# Patient Record
Sex: Female | Born: 1945 | Race: White | Hispanic: No | Smoking: Current every day smoker
Health system: Southern US, Community
[De-identification: ages and names within clinical notes are randomized; demographics above are authoritative.]

## PROBLEM LIST (undated history)

## (undated) DIAGNOSIS — R42 Dizziness and giddiness: Secondary | ICD-10-CM

## (undated) DIAGNOSIS — Z9841 Cataract extraction status, right eye: Secondary | ICD-10-CM

## (undated) DIAGNOSIS — Z972 Presence of dental prosthetic device (complete) (partial): Secondary | ICD-10-CM

## (undated) DIAGNOSIS — D119 Benign neoplasm of major salivary gland, unspecified: Secondary | ICD-10-CM

## (undated) DIAGNOSIS — L409 Psoriasis, unspecified: Secondary | ICD-10-CM

## (undated) DIAGNOSIS — N183 Chronic kidney disease, stage 3 unspecified: Secondary | ICD-10-CM

## (undated) DIAGNOSIS — J449 Chronic obstructive pulmonary disease, unspecified: Secondary | ICD-10-CM

## (undated) DIAGNOSIS — C4492 Squamous cell carcinoma of skin, unspecified: Secondary | ICD-10-CM

## (undated) DIAGNOSIS — Z7982 Long term (current) use of aspirin: Secondary | ICD-10-CM

## (undated) DIAGNOSIS — N189 Chronic kidney disease, unspecified: Secondary | ICD-10-CM

## (undated) DIAGNOSIS — I700 Atherosclerosis of aorta: Secondary | ICD-10-CM

## (undated) DIAGNOSIS — E785 Hyperlipidemia, unspecified: Secondary | ICD-10-CM

## (undated) DIAGNOSIS — C4491 Basal cell carcinoma of skin, unspecified: Secondary | ICD-10-CM

## (undated) DIAGNOSIS — I2510 Atherosclerotic heart disease of native coronary artery without angina pectoris: Secondary | ICD-10-CM

## (undated) DIAGNOSIS — I6529 Occlusion and stenosis of unspecified carotid artery: Secondary | ICD-10-CM

## (undated) DIAGNOSIS — I671 Cerebral aneurysm, nonruptured: Secondary | ICD-10-CM

## (undated) DIAGNOSIS — G459 Transient cerebral ischemic attack, unspecified: Secondary | ICD-10-CM

## (undated) DIAGNOSIS — R911 Solitary pulmonary nodule: Secondary | ICD-10-CM

## (undated) DIAGNOSIS — I10 Essential (primary) hypertension: Secondary | ICD-10-CM

## (undated) DIAGNOSIS — M51379 Other intervertebral disc degeneration, lumbosacral region without mention of lumbar back pain or lower extremity pain: Secondary | ICD-10-CM

## (undated) DIAGNOSIS — I219 Acute myocardial infarction, unspecified: Secondary | ICD-10-CM

## (undated) DIAGNOSIS — I714 Abdominal aortic aneurysm, without rupture, unspecified: Secondary | ICD-10-CM

## (undated) DIAGNOSIS — K08109 Complete loss of teeth, unspecified cause, unspecified class: Secondary | ICD-10-CM

## (undated) DIAGNOSIS — M1990 Unspecified osteoarthritis, unspecified site: Secondary | ICD-10-CM

## (undated) DIAGNOSIS — I739 Peripheral vascular disease, unspecified: Secondary | ICD-10-CM

## (undated) DIAGNOSIS — G4730 Sleep apnea, unspecified: Secondary | ICD-10-CM

## (undated) HISTORY — PX: CATARACT EXTRACTION W/ INTRAOCULAR LENS IMPLANT: SHX1309

## (undated) HISTORY — PX: EYE SURGERY: SHX253

---

## 1998-04-06 HISTORY — PX: UVULOPALATOPHARYNGOPLASTY: SHX827

## 2009-04-06 HISTORY — PX: CARDIAC CATHETERIZATION: SHX172

## 2009-07-05 DIAGNOSIS — I214 Non-ST elevation (NSTEMI) myocardial infarction: Secondary | ICD-10-CM

## 2009-07-05 DIAGNOSIS — I219 Acute myocardial infarction, unspecified: Secondary | ICD-10-CM

## 2009-07-05 HISTORY — DX: Acute myocardial infarction, unspecified: I21.9

## 2009-07-05 HISTORY — DX: Non-ST elevation (NSTEMI) myocardial infarction: I21.4

## 2009-07-11 ENCOUNTER — Inpatient Hospital Stay: Payer: Self-pay | Admitting: Internal Medicine

## 2009-07-12 HISTORY — PX: LEFT HEART CATH AND CORONARY ANGIOGRAPHY: CATH118249

## 2009-07-24 DIAGNOSIS — I10 Essential (primary) hypertension: Secondary | ICD-10-CM | POA: Insufficient documentation

## 2009-11-12 ENCOUNTER — Ambulatory Visit: Payer: Self-pay | Admitting: Ophthalmology

## 2009-12-17 ENCOUNTER — Ambulatory Visit: Payer: Self-pay | Admitting: Ophthalmology

## 2010-06-05 ENCOUNTER — Inpatient Hospital Stay: Payer: Self-pay | Admitting: Specialist

## 2012-04-06 DIAGNOSIS — G459 Transient cerebral ischemic attack, unspecified: Secondary | ICD-10-CM

## 2012-04-06 HISTORY — DX: Transient cerebral ischemic attack, unspecified: G45.9

## 2013-07-31 DIAGNOSIS — Z8673 Personal history of transient ischemic attack (TIA), and cerebral infarction without residual deficits: Secondary | ICD-10-CM

## 2014-06-18 DIAGNOSIS — C4492 Squamous cell carcinoma of skin, unspecified: Secondary | ICD-10-CM

## 2014-06-18 HISTORY — DX: Squamous cell carcinoma of skin, unspecified: C44.92

## 2014-10-29 DIAGNOSIS — L570 Actinic keratosis: Secondary | ICD-10-CM

## 2014-10-29 HISTORY — DX: Actinic keratosis: L57.0

## 2015-05-06 DIAGNOSIS — Z79899 Other long term (current) drug therapy: Secondary | ICD-10-CM | POA: Diagnosis not present

## 2015-05-06 DIAGNOSIS — Z85828 Personal history of other malignant neoplasm of skin: Secondary | ICD-10-CM | POA: Diagnosis not present

## 2015-05-06 DIAGNOSIS — L400 Psoriasis vulgaris: Secondary | ICD-10-CM | POA: Diagnosis not present

## 2015-05-06 DIAGNOSIS — L820 Inflamed seborrheic keratosis: Secondary | ICD-10-CM | POA: Diagnosis not present

## 2015-05-06 DIAGNOSIS — L720 Epidermal cyst: Secondary | ICD-10-CM | POA: Diagnosis not present

## 2015-05-06 DIAGNOSIS — L700 Acne vulgaris: Secondary | ICD-10-CM | POA: Diagnosis not present

## 2015-05-06 DIAGNOSIS — L570 Actinic keratosis: Secondary | ICD-10-CM | POA: Diagnosis not present

## 2015-06-05 DIAGNOSIS — E785 Hyperlipidemia, unspecified: Secondary | ICD-10-CM | POA: Diagnosis not present

## 2015-06-05 DIAGNOSIS — F17200 Nicotine dependence, unspecified, uncomplicated: Secondary | ICD-10-CM | POA: Diagnosis not present

## 2015-06-05 DIAGNOSIS — Z8673 Personal history of transient ischemic attack (TIA), and cerebral infarction without residual deficits: Secondary | ICD-10-CM | POA: Diagnosis not present

## 2015-06-05 DIAGNOSIS — I10 Essential (primary) hypertension: Secondary | ICD-10-CM | POA: Diagnosis not present

## 2015-06-05 DIAGNOSIS — I2510 Atherosclerotic heart disease of native coronary artery without angina pectoris: Secondary | ICD-10-CM | POA: Diagnosis not present

## 2015-06-06 DIAGNOSIS — E785 Hyperlipidemia, unspecified: Secondary | ICD-10-CM | POA: Diagnosis not present

## 2015-06-06 DIAGNOSIS — Z8673 Personal history of transient ischemic attack (TIA), and cerebral infarction without residual deficits: Secondary | ICD-10-CM | POA: Diagnosis not present

## 2015-06-06 DIAGNOSIS — I2510 Atherosclerotic heart disease of native coronary artery without angina pectoris: Secondary | ICD-10-CM | POA: Diagnosis not present

## 2015-06-06 DIAGNOSIS — I10 Essential (primary) hypertension: Secondary | ICD-10-CM | POA: Diagnosis not present

## 2015-06-14 DIAGNOSIS — L400 Psoriasis vulgaris: Secondary | ICD-10-CM | POA: Diagnosis not present

## 2015-08-12 DIAGNOSIS — L400 Psoriasis vulgaris: Secondary | ICD-10-CM | POA: Diagnosis not present

## 2015-08-12 DIAGNOSIS — L820 Inflamed seborrheic keratosis: Secondary | ICD-10-CM | POA: Diagnosis not present

## 2015-08-12 DIAGNOSIS — L720 Epidermal cyst: Secondary | ICD-10-CM | POA: Diagnosis not present

## 2015-09-09 DIAGNOSIS — J301 Allergic rhinitis due to pollen: Secondary | ICD-10-CM | POA: Diagnosis not present

## 2015-09-09 DIAGNOSIS — H6123 Impacted cerumen, bilateral: Secondary | ICD-10-CM | POA: Diagnosis not present

## 2015-10-16 DIAGNOSIS — E7800 Pure hypercholesterolemia, unspecified: Secondary | ICD-10-CM | POA: Diagnosis not present

## 2015-10-16 DIAGNOSIS — Z0000 Encounter for general adult medical examination without abnormal findings: Secondary | ICD-10-CM | POA: Diagnosis not present

## 2015-10-16 DIAGNOSIS — R7303 Prediabetes: Secondary | ICD-10-CM | POA: Diagnosis not present

## 2015-10-16 DIAGNOSIS — I10 Essential (primary) hypertension: Secondary | ICD-10-CM | POA: Diagnosis not present

## 2015-10-16 DIAGNOSIS — I6523 Occlusion and stenosis of bilateral carotid arteries: Secondary | ICD-10-CM | POA: Diagnosis not present

## 2015-10-16 DIAGNOSIS — F17200 Nicotine dependence, unspecified, uncomplicated: Secondary | ICD-10-CM | POA: Diagnosis not present

## 2015-10-16 DIAGNOSIS — Z23 Encounter for immunization: Secondary | ICD-10-CM | POA: Diagnosis not present

## 2015-10-21 DIAGNOSIS — I6523 Occlusion and stenosis of bilateral carotid arteries: Secondary | ICD-10-CM | POA: Diagnosis not present

## 2015-10-21 DIAGNOSIS — F17200 Nicotine dependence, unspecified, uncomplicated: Secondary | ICD-10-CM | POA: Diagnosis not present

## 2015-10-21 DIAGNOSIS — I2510 Atherosclerotic heart disease of native coronary artery without angina pectoris: Secondary | ICD-10-CM | POA: Diagnosis not present

## 2015-10-21 DIAGNOSIS — E7800 Pure hypercholesterolemia, unspecified: Secondary | ICD-10-CM | POA: Diagnosis not present

## 2015-10-21 DIAGNOSIS — I10 Essential (primary) hypertension: Secondary | ICD-10-CM | POA: Diagnosis not present

## 2015-10-21 DIAGNOSIS — N183 Chronic kidney disease, stage 3 (moderate): Secondary | ICD-10-CM | POA: Diagnosis not present

## 2015-10-22 DIAGNOSIS — Z1231 Encounter for screening mammogram for malignant neoplasm of breast: Secondary | ICD-10-CM | POA: Diagnosis not present

## 2015-11-01 DIAGNOSIS — I6523 Occlusion and stenosis of bilateral carotid arteries: Secondary | ICD-10-CM | POA: Diagnosis not present

## 2015-11-06 ENCOUNTER — Other Ambulatory Visit (HOSPITAL_COMMUNITY): Payer: Self-pay | Admitting: Vascular Surgery

## 2015-11-06 ENCOUNTER — Other Ambulatory Visit: Payer: Self-pay | Admitting: Vascular Surgery

## 2015-11-06 DIAGNOSIS — I6523 Occlusion and stenosis of bilateral carotid arteries: Secondary | ICD-10-CM

## 2015-11-06 DIAGNOSIS — I10 Essential (primary) hypertension: Secondary | ICD-10-CM | POA: Diagnosis not present

## 2015-11-06 DIAGNOSIS — E785 Hyperlipidemia, unspecified: Secondary | ICD-10-CM | POA: Diagnosis not present

## 2015-11-12 ENCOUNTER — Ambulatory Visit
Admission: RE | Admit: 2015-11-12 | Discharge: 2015-11-12 | Disposition: A | Discharge status: Home / Self Care | Payer: PPO | Source: Ambulatory Visit | Attending: Vascular Surgery | Admitting: Vascular Surgery

## 2015-11-12 DIAGNOSIS — I6523 Occlusion and stenosis of bilateral carotid arteries: Secondary | ICD-10-CM | POA: Diagnosis not present

## 2015-11-12 DIAGNOSIS — I6503 Occlusion and stenosis of bilateral vertebral arteries: Secondary | ICD-10-CM | POA: Insufficient documentation

## 2015-11-12 DIAGNOSIS — J432 Centrilobular emphysema: Secondary | ICD-10-CM | POA: Insufficient documentation

## 2015-11-12 HISTORY — DX: Essential (primary) hypertension: I10

## 2015-11-12 HISTORY — DX: Basal cell carcinoma of skin, unspecified: C44.91

## 2015-11-12 HISTORY — DX: Squamous cell carcinoma of skin, unspecified: C44.92

## 2015-11-12 LAB — POCT I-STAT CREATININE: Creatinine, Ser: 1.2 mg/dL — ABNORMAL HIGH (ref 0.44–1.00)

## 2015-11-12 MED ORDER — IOPAMIDOL (ISOVUE-370) INJECTION 76%
75.0000 mL | Freq: Once | INTRAVENOUS | Status: AC | PRN
Start: 2015-11-12 — End: 2015-11-12
  Administered 2015-11-12: 75 mL via INTRAVENOUS

## 2015-11-15 DIAGNOSIS — E785 Hyperlipidemia, unspecified: Secondary | ICD-10-CM | POA: Diagnosis not present

## 2015-11-15 DIAGNOSIS — I6523 Occlusion and stenosis of bilateral carotid arteries: Secondary | ICD-10-CM | POA: Diagnosis not present

## 2015-11-15 DIAGNOSIS — I10 Essential (primary) hypertension: Secondary | ICD-10-CM | POA: Diagnosis not present

## 2015-11-21 ENCOUNTER — Other Ambulatory Visit: Payer: Self-pay | Admitting: Vascular Surgery

## 2015-11-22 ENCOUNTER — Encounter
Admission: RE | Admit: 2015-11-22 | Discharge: 2015-11-22 | Disposition: A | Discharge status: Home / Self Care | Payer: PPO | Source: Ambulatory Visit | Attending: Vascular Surgery | Admitting: Vascular Surgery

## 2015-11-22 ENCOUNTER — Encounter: Payer: Self-pay | Admitting: *Deleted

## 2015-11-22 DIAGNOSIS — Z01812 Encounter for preprocedural laboratory examination: Secondary | ICD-10-CM | POA: Diagnosis not present

## 2015-11-22 HISTORY — DX: Peripheral vascular disease, unspecified: I73.9

## 2015-11-22 HISTORY — DX: Unspecified osteoarthritis, unspecified site: M19.90

## 2015-11-22 HISTORY — DX: Sleep apnea, unspecified: G47.30

## 2015-11-22 HISTORY — DX: Atherosclerotic heart disease of native coronary artery without angina pectoris: I25.10

## 2015-11-22 HISTORY — DX: Transient cerebral ischemic attack, unspecified: G45.9

## 2015-11-22 HISTORY — DX: Chronic kidney disease, unspecified: N18.9

## 2015-11-22 HISTORY — DX: Psoriasis, unspecified: L40.9

## 2015-11-22 HISTORY — DX: Hyperlipidemia, unspecified: E78.5

## 2015-11-22 HISTORY — DX: Acute myocardial infarction, unspecified: I21.9

## 2015-11-22 LAB — BASIC METABOLIC PANEL
ANION GAP: 9 (ref 5–15)
BUN: 22 mg/dL — ABNORMAL HIGH (ref 6–20)
CALCIUM: 9.9 mg/dL (ref 8.9–10.3)
CO2: 26 mmol/L (ref 22–32)
CREATININE: 1.49 mg/dL — AB (ref 0.44–1.00)
Chloride: 102 mmol/L (ref 101–111)
GFR calc Af Amer: 40 mL/min — ABNORMAL LOW (ref >60)
GFR calc non Af Amer: 34 mL/min — ABNORMAL LOW (ref >60)
GLUCOSE: 96 mg/dL (ref 65–99)
Potassium: 3.6 mmol/L (ref 3.5–5.1)
Sodium: 137 mmol/L (ref 135–145)

## 2015-11-22 LAB — CBC WITH DIFFERENTIAL/PLATELET
BASOS PCT: 0 %
Basophils Absolute: 0 10*3/uL (ref 0–0.1)
Eosinophils Absolute: 0.1 10*3/uL (ref 0–0.7)
Eosinophils Relative: 1 %
HEMATOCRIT: 42.2 % (ref 35.0–47.0)
Hemoglobin: 14.1 g/dL (ref 12.0–16.0)
Lymphocytes Relative: 20 %
Lymphs Abs: 2.1 10*3/uL (ref 1.0–3.6)
MCH: 29.7 pg (ref 26.0–34.0)
MCHC: 33.5 g/dL (ref 32.0–36.0)
MCV: 88.6 fL (ref 80.0–100.0)
MONO ABS: 0.8 10*3/uL (ref 0.2–0.9)
MONOS PCT: 8 %
NEUTROS ABS: 7.4 10*3/uL — AB (ref 1.4–6.5)
Neutrophils Relative %: 71 %
Platelets: 275 10*3/uL (ref 150–440)
RBC: 4.77 MIL/uL (ref 3.80–5.20)
RDW: 14.9 % — AB (ref 11.5–14.5)
WBC: 10.6 10*3/uL (ref 3.6–11.0)

## 2015-11-22 LAB — TYPE AND SCREEN
ABO/RH(D): A POS
Antibody Screen: NEGATIVE

## 2015-11-22 LAB — PROTIME-INR
INR: 0.87
Prothrombin Time: 11.8 seconds (ref 11.4–15.2)

## 2015-11-22 LAB — SURGICAL PCR SCREEN
MRSA, PCR: NEGATIVE
STAPHYLOCOCCUS AUREUS: NEGATIVE

## 2015-11-22 LAB — APTT: aPTT: 26 seconds (ref 24–36)

## 2015-11-22 NOTE — Patient Instructions (Signed)
  Your procedure is scheduled [id number] 24, 2017 (Thursday) Report to Same Day Surgery 2nd floor Medical Mall To find out your arrival time please call [phone] between 1PM - 3PM on November 27, 2015 (Wednesday)  Remember: Instructions that are not followed completely may result in serious medical risk, up to and including death, or upon the discretion of your surgeon and anesthesiologist your surgery may need to be rescheduled.    _x___ 1. Do not eat food or drink liquids after midnight. No gum chewing or hard candies.     _x___ 2. No Alcohol for 24 hours before or after surgery.   _x__3. No Smoking for 24 prior to surgery.   ____  4. Bring all medications with you on the day of surgery if instructed.    __x__ 5. Notify your doctor if there is any change in your medical condition     (cold, fever, infections).     Do not wear jewelry, make-up, hairpins, clips or nail polish.  Do not wear lotions, powders, or perfumes. You may wear deodorant.  Do not shave 48 hours prior to surgery. Men may shave face and neck.  Do not bring valuables to the hospital.    [facility] is not responsible for any belongings or valuables.               Contacts, dentures or bridgework may not be worn into surgery.  Leave your suitcase in the car. After surgery it may be brought to your room.  For patients admitted to the hospital, discharge time is determined by your treatment team.   Patients discharged the day of surgery will not be allowed to drive home.    Please read over the following fact sheets that you were given:   [facility] Preparing for Surgery and or MRSA Information   _x___ Take these medicines the morning of surgery with A SIP OF WATER:    1. Amlodipine  2. HydrAlazine  3. Isosorbide  4.  5.  6.  ____ Fleet Enema (as directed)   _x___ Use CHG Soap or sage wipes as directed on instruction sheet   ____ Use inhalers on the day of surgery and bring to hospital day of  surgery  ____ Stop metformin 2 days prior to surgery    ____ Take 1/2 of usual insulin dose the night before surgery and none on the morning of surgery             _x___ Stop aspirin or coumadin, or plavix  (STOP PLAVIX NOW) May continue Aspirin  _x__ Stop Anti-inflammatories such as Advil, Aleve, Ibuprofen, Motrin, Naproxen,          Naprosyn, Goodies powders or aspirin products. Ok to take Tylenol.   ____ Stop supplements until after surgery.    ____ Bring C-Pap to the hospital.

## 2015-11-22 NOTE — Pre-Procedure Instructions (Signed)
  Component Name Value Ref Range  Vent Rate (bpm) 64   PR Interval (msec) 176   QRS Interval (msec) 98   QT Interval (msec) 426   QTc (msec) 439   Result Narrative  Normal sinus rhythm Nonspecific T wave abnormalities Abnormal ECG When compared with ECG of 07-Jul-2013 20:43, Nonspecific T wave abnormalities no longer evident in Inferior leads T wave inversion now evident in Anterior leads I reviewed and concur with this report. Electronically signed [id number] MD, KEN [phone]) on 10/28/2015 8:46:14 AM  Status Results Details    Initial consult on 10/21/2015 [place]")' href="[email or web address]

## 2015-11-22 NOTE — Pre-Procedure Instructions (Signed)
Cardiac clearance received from Dr. Ubaldo [place].

## 2015-11-28 ENCOUNTER — Encounter: Payer: Self-pay | Admitting: *Deleted

## 2015-11-28 ENCOUNTER — Inpatient Hospital Stay: Payer: PPO | Admitting: Anesthesiology

## 2015-11-28 ENCOUNTER — Encounter
Admission: RE | Disposition: A | Discharge status: Home / Self Care | Payer: Self-pay | Source: Ambulatory Visit | Attending: Vascular Surgery

## 2015-11-28 ENCOUNTER — Inpatient Hospital Stay
Admission: RE | Admit: 2015-11-28 | Discharge: 2015-11-29 | DRG: 039 | Disposition: A | Discharge status: Home / Self Care | Payer: PPO | Source: Ambulatory Visit | Attending: Vascular Surgery | Admitting: Vascular Surgery

## 2015-11-28 DIAGNOSIS — R590 Localized enlarged lymph nodes: Secondary | ICD-10-CM | POA: Diagnosis present

## 2015-11-28 DIAGNOSIS — I6521 Occlusion and stenosis of right carotid artery: Secondary | ICD-10-CM | POA: Diagnosis not present

## 2015-11-28 DIAGNOSIS — Z79899 Other long term (current) drug therapy: Secondary | ICD-10-CM | POA: Diagnosis not present

## 2015-11-28 DIAGNOSIS — I252 Old myocardial infarction: Secondary | ICD-10-CM

## 2015-11-28 DIAGNOSIS — G4730 Sleep apnea, unspecified: Secondary | ICD-10-CM | POA: Diagnosis present

## 2015-11-28 DIAGNOSIS — I129 Hypertensive chronic kidney disease with stage 1 through stage 4 chronic kidney disease, or unspecified chronic kidney disease: Secondary | ICD-10-CM | POA: Diagnosis not present

## 2015-11-28 DIAGNOSIS — I739 Peripheral vascular disease, unspecified: Secondary | ICD-10-CM | POA: Diagnosis present

## 2015-11-28 DIAGNOSIS — Z85828 Personal history of other malignant neoplasm of skin: Secondary | ICD-10-CM

## 2015-11-28 DIAGNOSIS — I6529 Occlusion and stenosis of unspecified carotid artery: Secondary | ICD-10-CM | POA: Diagnosis present

## 2015-11-28 DIAGNOSIS — Z7982 Long term (current) use of aspirin: Secondary | ICD-10-CM | POA: Diagnosis not present

## 2015-11-28 DIAGNOSIS — I6523 Occlusion and stenosis of bilateral carotid arteries: Secondary | ICD-10-CM | POA: Diagnosis not present

## 2015-11-28 DIAGNOSIS — R221 Localized swelling, mass and lump, neck: Secondary | ICD-10-CM | POA: Diagnosis not present

## 2015-11-28 DIAGNOSIS — I6522 Occlusion and stenosis of left carotid artery: Secondary | ICD-10-CM | POA: Diagnosis not present

## 2015-11-28 DIAGNOSIS — I2510 Atherosclerotic heart disease of native coronary artery without angina pectoris: Secondary | ICD-10-CM | POA: Diagnosis not present

## 2015-11-28 DIAGNOSIS — E785 Hyperlipidemia, unspecified: Secondary | ICD-10-CM | POA: Diagnosis not present

## 2015-11-28 DIAGNOSIS — F17200 Nicotine dependence, unspecified, uncomplicated: Secondary | ICD-10-CM | POA: Diagnosis not present

## 2015-11-28 DIAGNOSIS — N183 Chronic kidney disease, stage 3 (moderate): Secondary | ICD-10-CM | POA: Diagnosis not present

## 2015-11-28 DIAGNOSIS — Z8673 Personal history of transient ischemic attack (TIA), and cerebral infarction without residual deficits: Secondary | ICD-10-CM

## 2015-11-28 DIAGNOSIS — I10 Essential (primary) hypertension: Secondary | ICD-10-CM | POA: Diagnosis not present

## 2015-11-28 HISTORY — PX: ENDARTERECTOMY: SHX5162

## 2015-11-28 SURGERY — ENDARTERECTOMY, CAROTID
Anesthesia: General | Laterality: Left | Wound class: Clean

## 2015-11-28 MED ORDER — HYDRALAZINE HCL 25 MG PO TABS
25.0000 mg | ORAL_TABLET | Freq: Two times a day (BID) | ORAL | Status: DC
  Administered 2015-11-29: 25 mg via ORAL
  Filled 2015-11-28: qty 1

## 2015-11-28 MED ORDER — CEFAZOLIN SODIUM-DEXTROSE 2-4 GM/100ML-% IV SOLN
2.0000 g | INTRAVENOUS | Status: AC
  Administered 2015-11-28: 2 g via INTRAVENOUS

## 2015-11-28 MED ORDER — LIDOCAINE HCL (PF) 1 % IJ SOLN
INTRAMUSCULAR | Status: AC
  Filled 2015-11-28: qty 30

## 2015-11-28 MED ORDER — SUCCINYLCHOLINE CHLORIDE 20 MG/ML IJ SOLN
INTRAMUSCULAR | Status: DC | PRN
  Administered 2015-11-28: 100 mg via INTRAVENOUS

## 2015-11-28 MED ORDER — SODIUM CHLORIDE 0.9 % IV SOLN: 500.0000 mL | Freq: Once | INTRAVENOUS | Status: DC | PRN

## 2015-11-28 MED ORDER — DEXAMETHASONE SODIUM PHOSPHATE 10 MG/ML IJ SOLN
INTRAMUSCULAR | Status: DC | PRN
  Administered 2015-11-28: 10 mg via INTRAVENOUS

## 2015-11-28 MED ORDER — FLUTICASONE PROPIONATE 50 MCG/ACT NA SUSP
2.0000 | Freq: Every day | NASAL | Status: DC
Start: 2015-11-29 — End: 2015-11-29
  Filled 2015-11-28: qty 16

## 2015-11-28 MED ORDER — PHENOL 1.4 % MT LIQD
1.0000 | OROMUCOSAL | Status: DC | PRN
  Filled 2015-11-28: qty 177

## 2015-11-28 MED ORDER — GUAIFENESIN-DM 100-10 MG/5ML PO SYRP: 15.0000 mL | ORAL_SOLUTION | ORAL | Status: DC | PRN

## 2015-11-28 MED ORDER — SODIUM CHLORIDE FLUSH 0.9 % IV SOLN
INTRAVENOUS | Status: AC
  Filled 2015-11-28: qty 3

## 2015-11-28 MED ORDER — ESMOLOL HCL-SODIUM CHLORIDE 2000 MG/100ML IV SOLN: 25.0000 ug/kg/min | INTRAVENOUS | Status: DC

## 2015-11-28 MED ORDER — FAMOTIDINE 20 MG PO TABS
20.0000 mg | ORAL_TABLET | Freq: Once | ORAL | Status: AC
  Administered 2015-11-28: 20 mg via ORAL

## 2015-11-28 MED ORDER — ALBUTEROL SULFATE HFA 108 (90 BASE) MCG/ACT IN AERS
INHALATION_SPRAY | RESPIRATORY_TRACT | Status: DC | PRN
  Administered 2015-11-28: 6 via RESPIRATORY_TRACT

## 2015-11-28 MED ORDER — HEPARIN SODIUM (PORCINE) 1000 UNIT/ML IJ SOLN
INTRAMUSCULAR | Status: AC
  Filled 2015-11-28: qty 1

## 2015-11-28 MED ORDER — CEFAZOLIN SODIUM-DEXTROSE 2-4 GM/100ML-% IV SOLN
INTRAVENOUS | Status: AC
  Filled 2015-11-28: qty 100

## 2015-11-28 MED ORDER — ALUM & MAG HYDROXIDE-SIMETH 200-200-20 MG/5ML PO SUSP: 15.0000 mL | ORAL | Status: DC | PRN

## 2015-11-28 MED ORDER — DOCUSATE SODIUM 100 MG PO CAPS
100.0000 mg | ORAL_CAPSULE | Freq: Every day | ORAL | Status: DC
  Administered 2015-11-29: 100 mg via ORAL
  Filled 2015-11-28: qty 1

## 2015-11-28 MED ORDER — LIDOCAINE HCL 1 % IJ SOLN
INTRAMUSCULAR | Status: DC | PRN
  Administered 2015-11-28: 10 mL

## 2015-11-28 MED ORDER — EVICEL 2 ML EX KIT
PACK | CUTANEOUS | Status: DC | PRN
  Administered 2015-11-28: 2 mL

## 2015-11-28 MED ORDER — SODIUM CHLORIDE 0.9 % IV SOLN
INTRAVENOUS | Status: DC
  Administered 2015-11-28: 1000 mL via INTRAVENOUS

## 2015-11-28 MED ORDER — SODIUM CHLORIDE 0.9 % IV SOLN
INTRAVENOUS | Status: DC | PRN
  Administered 2015-11-28: .05 ug/kg/min via INTRAVENOUS

## 2015-11-28 MED ORDER — AMLODIPINE BESYLATE 10 MG PO TABS
10.0000 mg | ORAL_TABLET | Freq: Every day | ORAL | Status: DC
  Administered 2015-11-29: 10 mg via ORAL
  Filled 2015-11-28: qty 1

## 2015-11-28 MED ORDER — PROPOFOL 10 MG/ML IV BOLUS
INTRAVENOUS | Status: DC | PRN
  Administered 2015-11-28: 100 mg via INTRAVENOUS

## 2015-11-28 MED ORDER — ATORVASTATIN CALCIUM 20 MG PO TABS
40.0000 mg | ORAL_TABLET | Freq: Every day | ORAL | Status: DC
  Administered 2015-11-28: 40 mg via ORAL
  Filled 2015-11-28: qty 2

## 2015-11-28 MED ORDER — HYDRALAZINE HCL 20 MG/ML IJ SOLN: 5.0000 mg | INTRAMUSCULAR | Status: DC | PRN

## 2015-11-28 MED ORDER — ACETAMINOPHEN 650 MG RE SUPP
325.0000 mg | RECTAL | Status: DC | PRN
Start: 2015-11-28 — End: 2015-11-29

## 2015-11-28 MED ORDER — CLOPIDOGREL BISULFATE 75 MG PO TABS
75.0000 mg | ORAL_TABLET | Freq: Every day | ORAL | Status: DC
  Administered 2015-11-29: 75 mg via ORAL
  Filled 2015-11-28: qty 1

## 2015-11-28 MED ORDER — EVICEL 2 ML EX KIT
PACK | CUTANEOUS | Status: AC
  Filled 2015-11-28: qty 1

## 2015-11-28 MED ORDER — DEXTROSE 5 % IV SOLN
1.5000 g | Freq: Two times a day (BID) | INTRAVENOUS | Status: AC
  Administered 2015-11-28 – 2015-11-29 (×2): 1.5 g via INTRAVENOUS
  Filled 2015-11-28 (×2): qty 1.5

## 2015-11-28 MED ORDER — ROCURONIUM BROMIDE 100 MG/10ML IV SOLN
INTRAVENOUS | Status: DC | PRN
  Administered 2015-11-28: 30 mg via INTRAVENOUS
  Administered 2015-11-28: 10 mg via INTRAVENOUS

## 2015-11-28 MED ORDER — CEFAZOLIN SODIUM-DEXTROSE 2-3 GM-% IV SOLR
INTRAVENOUS | Status: DC | PRN
  Administered 2015-11-28: 2 g via INTRAVENOUS

## 2015-11-28 MED ORDER — ONDANSETRON HCL 4 MG/2ML IJ SOLN: 4.0000 mg | Freq: Once | INTRAMUSCULAR | Status: DC | PRN

## 2015-11-28 MED ORDER — ASPIRIN EC 81 MG PO TBEC
81.0000 mg | DELAYED_RELEASE_TABLET | Freq: Every day | ORAL | Status: DC
  Administered 2015-11-28 – 2015-11-29 (×2): 81 mg via ORAL
  Filled 2015-11-28 (×3): qty 1

## 2015-11-28 MED ORDER — FENTANYL CITRATE (PF) 100 MCG/2ML IJ SOLN
INTRAMUSCULAR | Status: AC
  Filled 2015-11-28: qty 2

## 2015-11-28 MED ORDER — SODIUM CHLORIDE 0.9 % IV SOLN
INTRAVENOUS | Status: DC | PRN
  Administered 2015-11-28: 40 mL via INTRAMUSCULAR

## 2015-11-28 MED ORDER — ACETAMINOPHEN 325 MG PO TABS: 325.0000 mg | ORAL_TABLET | ORAL | Status: DC | PRN

## 2015-11-28 MED ORDER — CEFAZOLIN SODIUM 1 G IJ SOLR
INTRAMUSCULAR | Status: DC | PRN
  Administered 2015-11-28: 1000 mL

## 2015-11-28 MED ORDER — CHLORHEXIDINE GLUCONATE CLOTH 2 % EX PADS: 6.0000 | MEDICATED_PAD | Freq: Once | CUTANEOUS | Status: DC

## 2015-11-28 MED ORDER — LIDOCAINE 2% (20 MG/ML) 5 ML SYRINGE
INTRAMUSCULAR | Status: DC | PRN
  Administered 2015-11-28: 100 mg via INTRAVENOUS

## 2015-11-28 MED ORDER — FAMOTIDINE 20 MG PO TABS
ORAL_TABLET | ORAL | Status: AC
  Administered 2015-11-28: 20 mg
  Filled 2015-11-28: qty 1

## 2015-11-28 MED ORDER — PHENYLEPHRINE HCL 10 MG/ML IJ SOLN
INTRAMUSCULAR | Status: DC | PRN
  Administered 2015-11-28 (×3): 100 ug via INTRAVENOUS

## 2015-11-28 MED ORDER — LACTATED RINGERS IV SOLN
INTRAVENOUS | Status: DC
  Administered 2015-11-28 (×3): via INTRAVENOUS

## 2015-11-28 MED ORDER — LABETALOL HCL 5 MG/ML IV SOLN: 10.0000 mg | INTRAVENOUS | Status: DC | PRN

## 2015-11-28 MED ORDER — ISOSORBIDE MONONITRATE ER 30 MG PO TB24
30.0000 mg | ORAL_TABLET | Freq: Every day | ORAL | Status: DC
  Administered 2015-11-29: 30 mg via ORAL
  Filled 2015-11-28: qty 1

## 2015-11-28 MED ORDER — NITROGLYCERIN IN D5W 200-5 MCG/ML-% IV SOLN
INTRAVENOUS | Status: AC
  Filled 2015-11-28: qty 250

## 2015-11-28 MED ORDER — CEFAZOLIN SODIUM 1 G IJ SOLR
INTRAMUSCULAR | Status: AC
  Filled 2015-11-28: qty 10

## 2015-11-28 MED ORDER — PHENYLEPHRINE HCL 10 MG/ML IJ SOLN
INTRAVENOUS | Status: DC | PRN
  Administered 2015-11-28: 50 ug/min via INTRAVENOUS

## 2015-11-28 MED ORDER — NITROGLYCERIN IN D5W 200-5 MCG/ML-% IV SOLN: 5.0000 ug/min | INTRAVENOUS | Status: DC

## 2015-11-28 MED ORDER — DEXMEDETOMIDINE HCL 200 MCG/2ML IV SOLN
INTRAVENOUS | Status: DC | PRN
  Administered 2015-11-28: 12 ug via INTRAVENOUS

## 2015-11-28 MED ORDER — MAGNESIUM SULFATE 2 GM/50ML IV SOLN
2.0000 g | Freq: Every day | INTRAVENOUS | Status: DC | PRN
  Filled 2015-11-28: qty 50

## 2015-11-28 MED ORDER — DOPAMINE-DEXTROSE 3.2-5 MG/ML-% IV SOLN: 3.0000 ug/kg/min | INTRAVENOUS | Status: DC

## 2015-11-28 MED ORDER — FENTANYL CITRATE (PF) 100 MCG/2ML IJ SOLN
INTRAMUSCULAR | Status: DC | PRN
  Administered 2015-11-28 (×2): 100 ug via INTRAVENOUS

## 2015-11-28 MED ORDER — ONDANSETRON HCL 4 MG/2ML IJ SOLN: 4.0000 mg | Freq: Four times a day (QID) | INTRAMUSCULAR | Status: DC | PRN

## 2015-11-28 MED ORDER — HEPARIN SODIUM (PORCINE) 1000 UNIT/ML IJ SOLN
INTRAMUSCULAR | Status: DC | PRN
  Administered 2015-11-28: 5000 [IU] via INTRAVENOUS

## 2015-11-28 MED ORDER — ONDANSETRON HCL 4 MG/2ML IJ SOLN
INTRAMUSCULAR | Status: DC | PRN
Start: 2015-11-28 — End: 2015-11-28
  Administered 2015-11-28: 4 mg via INTRAVENOUS

## 2015-11-28 MED ORDER — FENTANYL CITRATE (PF) 100 MCG/2ML IJ SOLN
25.0000 ug | INTRAMUSCULAR | Status: DC | PRN
  Administered 2015-11-28 (×2): 25 ug via INTRAVENOUS

## 2015-11-28 MED ORDER — POTASSIUM CHLORIDE CRYS ER 20 MEQ PO TBCR: 20.0000 meq | EXTENDED_RELEASE_TABLET | Freq: Every day | ORAL | Status: DC | PRN

## 2015-11-28 MED ORDER — NITROGLYCERIN 0.2 MG/ML ON CALL CATH LAB
INTRAVENOUS | Status: DC | PRN
  Administered 2015-11-28 (×5): 40 ug via INTRAVENOUS

## 2015-11-28 MED ORDER — MORPHINE SULFATE (PF) 2 MG/ML IV SOLN
2.0000 mg | INTRAVENOUS | Status: DC | PRN
  Administered 2015-11-28 – 2015-11-29 (×2): 2 mg via INTRAVENOUS
  Filled 2015-11-28 (×2): qty 1

## 2015-11-28 MED ORDER — METOPROLOL TARTRATE 5 MG/5ML IV SOLN: 2.0000 mg | INTRAVENOUS | Status: DC | PRN

## 2015-11-28 MED ORDER — FAMOTIDINE IN NACL 20-0.9 MG/50ML-% IV SOLN
20.0000 mg | Freq: Every day | INTRAVENOUS | Status: DC
Start: 2015-11-28 — End: 2015-11-29
  Administered 2015-11-28: 20 mg via INTRAVENOUS
  Filled 2015-11-28: qty 50

## 2015-11-28 MED ORDER — TRAMADOL HCL 50 MG PO TABS
50.0000 mg | ORAL_TABLET | Freq: Four times a day (QID) | ORAL | Status: DC | PRN
Start: 2015-11-28 — End: 2015-11-29

## 2015-11-28 SURGICAL SUPPLY — 60 items
BAG DECANTER FOR FLEXI CONT (MISCELLANEOUS) ×3 IMPLANT
BLADE SURG 15 STRL LF DISP TIS (BLADE) ×1 IMPLANT
BLADE SURG 15 STRL SS (BLADE) ×2
BLADE SURG SZ11 CARB STEEL (BLADE) ×3 IMPLANT
BOOT SUTURE AID YELLOW STND (SUTURE) ×3 IMPLANT
BRUSH SCRUB 4% CHG (MISCELLANEOUS) ×3 IMPLANT
CANISTER SUCT 1200ML W/VALVE (MISCELLANEOUS) ×3 IMPLANT
CATH TRAY 16F METER LATEX (MISCELLANEOUS) ×3 IMPLANT
DRAPE INCISE IOBAN 66X45 STRL (DRAPES) ×3 IMPLANT
DRAPE LAPAROTOMY 77X122 PED (DRAPES) ×3 IMPLANT
DRAPE SHEET LG 3/4 BI-LAMINATE (DRAPES) ×3 IMPLANT
DRSG TEGADERM 4X4.75 (GAUZE/BANDAGES/DRESSINGS) IMPLANT
DRSG TELFA 3X8 NADH (GAUZE/BANDAGES/DRESSINGS) IMPLANT
DURAPREP 26ML APPLICATOR (WOUND CARE) ×3 IMPLANT
ELECT CAUTERY BLADE 6.4 (BLADE) ×3 IMPLANT
ELECT REM PT RETURN 9FT ADLT (ELECTROSURGICAL) ×3
ELECTRODE REM PT RTRN 9FT ADLT (ELECTROSURGICAL) ×1 IMPLANT
GLOVE BIO SURGEON STRL SZ7 (GLOVE) ×15 IMPLANT
GLOVE INDICATOR 7.5 STRL GRN (GLOVE) ×6 IMPLANT
GOWN STRL REUS W/ TWL LRG LVL3 (GOWN DISPOSABLE) ×3 IMPLANT
GOWN STRL REUS W/ TWL XL LVL3 (GOWN DISPOSABLE) ×2 IMPLANT
GOWN STRL REUS W/TWL LRG LVL3 (GOWN DISPOSABLE) ×6
GOWN STRL REUS W/TWL XL LVL3 (GOWN DISPOSABLE) ×4
HEMOSTAT SURGICEL 2X3 (HEMOSTASIS) ×3 IMPLANT
IV NS 250ML (IV SOLUTION) ×2
IV NS 250ML BAXH (IV SOLUTION) ×1 IMPLANT
KIT RM TURNOVER STRD PROC AR (KITS) ×3 IMPLANT
LABEL OR SOLS (LABEL) ×3 IMPLANT
LIQUID BAND (GAUZE/BANDAGES/DRESSINGS) ×3 IMPLANT
LOOP RED MAXI  1X406MM (MISCELLANEOUS) ×4
LOOP VESSEL MAXI 1X406 RED (MISCELLANEOUS) ×2 IMPLANT
LOOP VESSEL MINI 0.8X406 BLUE (MISCELLANEOUS) ×1 IMPLANT
LOOPS BLUE MINI 0.8X406MM (MISCELLANEOUS) ×2
NEEDLE FILTER BLUNT 18X 1/2SAF (NEEDLE)
NEEDLE FILTER BLUNT 18X1 1/2 (NEEDLE) IMPLANT
NEEDLE HYPO 25X1 1.5 SAFETY (NEEDLE) ×3 IMPLANT
NS IRRIG 1000ML POUR BTL (IV SOLUTION) ×3 IMPLANT
PACK BASIN MAJOR ARMC (MISCELLANEOUS) ×3 IMPLANT
PATCH CAROTID ECM VASC 1X10 (Prosthesis & Implant Heart) ×3 IMPLANT
PATCH VASCULAR 8X75 HEK08/75CP (Graft) IMPLANT
PENCIL ELECTRO HAND CTR (MISCELLANEOUS) IMPLANT
SHUNT W TPORT 9FR PRUITT F3 (SHUNT) ×3 IMPLANT
SUT MNCRL 4-0 (SUTURE) ×2
SUT MNCRL 4-0 27XMFL (SUTURE) ×1
SUT PROLENE 6 0 BV (SUTURE) ×12 IMPLANT
SUT PROLENE 7 0 BV 1 (SUTURE) ×6 IMPLANT
SUT SILK 2 0 (SUTURE) ×2
SUT SILK 2-0 18XBRD TIE 12 (SUTURE) ×1 IMPLANT
SUT SILK 3 0 (SUTURE) ×2
SUT SILK 3-0 18XBRD TIE 12 (SUTURE) ×1 IMPLANT
SUT SILK 4 0 (SUTURE)
SUT SILK 4-0 18XBRD TIE 12 (SUTURE) IMPLANT
SUT VIC AB 3-0 SH 27 (SUTURE) ×2
SUT VIC AB 3-0 SH 27X BRD (SUTURE) ×1 IMPLANT
SUTURE MNCRL 4-0 27XMF (SUTURE) ×1 IMPLANT
SYR 20CC LL (SYRINGE) ×3 IMPLANT
SYRINGE 10CC LL (SYRINGE) ×6 IMPLANT
TOWEL OR 17X26 4PK STRL BLUE (TOWEL DISPOSABLE) IMPLANT
TUBING CONNECTING 10 (TUBING) IMPLANT
TUBING CONNECTING 10' (TUBING)

## 2015-11-28 NOTE — Anesthesia Postprocedure Evaluation (Signed)
Anesthesia Post Note  Patient: Lisa Wilkerson  Procedure(s) Performed: Procedure(s) (LRB): ENDARTERECTOMY CAROTID (Left)  Patient location during evaluation: PACU Anesthesia Type: General Level of consciousness: awake and alert and oriented Pain management: pain level controlled Vital Signs Assessment: post-procedure vital signs reviewed and stable Respiratory status: spontaneous breathing Cardiovascular status: blood pressure returned to baseline Anesthetic complications: no    Last Vitals:  Vitals:   11/28/15 1530 11/28/15 1546  BP: 111/67 106/65  Pulse: 65 64  Resp: 15 16  Temp:      Last Pain:  Vitals:   11/28/15 1546  TempSrc:   PainSc: 3                  Kyler Lerette

## 2015-11-28 NOTE — Anesthesia Procedure Notes (Signed)
Date/Time: 11/28/2015 12:45 PM Performed by: Marsh Dolly

## 2015-11-28 NOTE — Transfer of Care (Signed)
Immediate Anesthesia Transfer of Care Note  Patient: Lisa Wilkerson  Procedure(s) Performed: Procedure(s): ENDARTERECTOMY CAROTID (Left)  Patient Location: PACU  Anesthesia Type:General  Level of Consciousness: awake, alert  and oriented  Airway & Oxygen Therapy: Patient Spontanous Breathing and Patient connected to face mask oxygen  Post-op Assessment: Report given to RN and Post -op Vital signs reviewed and stable  Post vital signs: Reviewed and stable  Last Vitals:  Vitals:   11/28/15 1126  BP: (!) 147/83  Pulse: 65  Resp: 18  Temp: 36.4 C    Last Pain:  Vitals:   11/28/15 1126  TempSrc: Oral         Complications: No apparent anesthesia complications

## 2015-11-28 NOTE — Op Note (Signed)
[facility]   OPERATIVE NOTE  PROCEDURE:   1.  Left carotid endarterectomy with CorMatrix arterial patch reconstruction  PRE-OPERATIVE DIAGNOSIS: 1.  Left carotid stenosis  POST-OPERATIVE DIAGNOSIS: same as above   SURGEON: Leotis Pain, MD  ASSISTANT(S): Hezzie Bump, PA-C  ANESTHESIA: general  ESTIMATED BLOOD LOSS: 100 cc  FINDING(S): 1.  Left carotid plaque.  SPECIMEN(S):  Carotid plaque (sent to Pathology) Also, a large lymph node was dissected out and sent to pathology  INDICATIONS:   Lisa Wilkerson is a 70 y.o. female who presents with Left carotid stenosis of >80%.  I discussed with the patient the risks, benefits, and alternatives to carotid endarterectomy.  I discussed the differences between carotid stenting and carotid endarterectomy. I discussed the procedural details of carotid endarterectomy with the patient.  The patient is aware that the risks of carotid endarterectomy include but are not limited to: bleeding, infection, stroke, myocardial infarction, death, cranial nerve injuries both temporary and permanent, neck hematoma, possible airway compromise, labile blood pressure post-operatively, cerebral hyperperfusion syndrome, and possible need for additional interventions in the future. The patient is aware of the risks and agrees to proceed forward with the procedure.  DESCRIPTION: After full informed written consent was obtained from the patient, the patient was brought back to the operating room and placed supine upon the operating table.  Prior to induction, the patient received IV antibiotics.  After obtaining adequate anesthesia, the patient was placed into a modified beach chair position with a shoulder roll in place and the patient's neck slightly hyperextended and rotated away from the surgical site.  The patient was prepped in the standard fashion for a carotid endarterectomy.  I made an incision anterior to the sternocleidomastoid muscle  and dissected down through the subcutaneous tissue.  The platysmas was opened with electrocautery.  Then I dissected down to the internal jugular vein and facial vein.  The facial vein is ligated and divided between 2-0 silk ties. In this area, there was a very large structure that appeared to be nodal in nature.  This was larger than a golf ball, and originated from the superior/anterior area of the incision.  This needed to be removed to allow our exposure, and it was dissected out with electrocautery and the two sizeable feeding vessels that went to the structure were tied off with 2-0 silk ties.  This was then removed. Even though it was not hardened, but given its size I decided to send it to pathology. I then proceeded with dissecting out the carotid artery. This was dissected posteriorly until I obtained visualization of the common carotid artery.  This was dissected out and then a vessel loop was placed around the common carotid artery.  I then dissected in a periadventitial fashion along the common carotid artery up to the bifurcation.  I then identified the external carotid artery and the superior thyroid artery.  I placed a vessel loop around the superior thyroid artery, and I also dissected out the external carotid artery and placed a vessel loop around it. In the process of this dissection, the hypoglossal nerve was identified and protected from harm.  I then dissected out the internal carotid artery until I identified an area in the internal carotid artery clearly above the stenosis.  I dissected slightly distal to this area, and placed a vessel loop around the artery.  At this point, we gave the patient 5000 units of intravenous heparin.  After this was allowed to circulate  for several minutes, I pulled up control on the vessel loops to clamp the internal carotid artery, external carotid artery, superior thyroid artery, and then the common carotid artery.  I then made an arteriotomy in the common  carotid artery with a 11 blade, and extended the arteriotomy with a Potts scissor down into the common carotid artery, then I carried the arteriotomy through the bifurcation into the internal carotid artery until I reached an area that was not diseased.  At this point, I took the [place] shunt that previously been prepared and I inserted it into the internal carotid artery first, and then into the common carotid artery taking care to flush and de-air prior to release of control. At this point, I started the endarterectomy in the common carotid artery with a [place]field elevator and carried this dissection down into the common carotid artery circumferentially.  Then I transected the plaque at a segment where it was adherent and transected the plaque with Potts scissors.  I then carried this dissection up into the external carotid artery.  The plaque was extracted by unclamping the external carotid artery and performing an eversion endarterectomy.  The dissection was then carried into the internal carotid artery where a nice feathered end point was created with gentle traction.  I passed the plaque off the field as a specimen. At this point I removed all loose flecks and remaining disease possible.  At this point, I was satisfied that the minimal remaining disease was densely adherent to the wall and wall integrity was intact.  I then fashioned a CorMatrix arterial patch for the artery and sewed it in place with two running stitches of 6-0 Prolene.  I started at the distal endpoint and ran one half the length of the arteriotomy.  I then cut and beveled the patch to an appropriate length to match the arteriotomy.  I started the second 6-0 Prolene at the proximal end point.  The medial suture line was completed and the lateral suture line was run approximately one quarter the length of the arteriotomy.  Prior to completing this patch angioplasty, I removed the shunt first from the internal carotid artery, from which  there was excellent backbleeding, and clamped it.  Then I removed the shunt from the common carotid artery, from which there was excellent antegrade bleeding, and then clamped it.  At this point, I allowed the external carotid artery to backbleed, which was excellent.  Then I instilled heparinized saline in this patched artery and then completed the patch angioplasty in the usual fashion.  First, I released the clamp on the external carotid artery, then I released it on the common carotid artery.  After waiting a few seconds, I then released it on the internal carotid artery. Several minutes of pressure were held and 6-0 Prolene patch sutures were used as need for hemostasis.  At this point, I placed Surgicel and Evicel topical hemostatic agents.  There was no more active bleeding in the surgical site.  The sternocleidomastoid space was closed with three interrupted 3-0 Vicryl sutures. I then reapproximated the platysma muscle with a running stitch of 3-0 Vicryl.  The skin was then closed with a running subcuticular 4-0 Monocryl.  The skin was then cleaned, dried and Dermabond was used to reinforce the skin closure.  The patient awakened and was taken to the recovery room in stable condition, following commands and moving all four extremities without any apparent deficits.    COMPLICATIONS: none  CONDITION: stable  DEW,JASON  11/28/2015, 2:48 PM

## 2015-11-28 NOTE — Anesthesia Procedure Notes (Signed)
Procedure Name: Intubation Date/Time: 11/28/2015 12:45 PM Performed by: Marsh Dolly Pre-anesthesia Checklist: Patient identified, Patient being monitored, Timeout performed, Emergency Drugs available and Suction available Patient Re-evaluated:Patient Re-evaluated prior to inductionOxygen Delivery Method: Circle system utilized Preoxygenation: Pre-oxygenation with 100% oxygen Intubation Type: IV induction Ventilation: Mask ventilation without difficulty Laryngoscope Size: 3 and Miller Grade View: Grade I Tube type: Oral Tube size: 7.5 mm Number of attempts: 1 Placement Confirmation: ETT inserted through vocal cords under direct vision,  positive ETCO2 and breath sounds checked- equal and bilateral Secured at: 21 cm Tube secured with: Tape Dental Injury: Teeth and Oropharynx as per pre-operative assessment

## 2015-11-28 NOTE — H&P (Signed)
  [facility] History & Physical Update  The patient was interviewed and re-examined.  The patient's previous History and Physical has been reviewed and is unchanged.  There is no change in the plan of care. We plan to proceed with the scheduled procedure.  Lloyde Ludlam, MD  11/28/2015, 12:09 PM

## 2015-11-28 NOTE — Progress Notes (Signed)
Pt. Very hard iv stick, attempted with MM RN For iv also and on success.

## 2015-11-28 NOTE — Anesthesia Preprocedure Evaluation (Signed)
Anesthesia Evaluation  Patient identified by MRN, date of birth, ID band Patient awake    Reviewed: Allergy & Precautions, NPO status , Patient's Chart, lab work & pertinent test results  Airway Mallampati: III  TM Distance: <3 FB     Dental  (+) Upper Dentures, Lower Dentures   Pulmonary sleep apnea , Current Smoker,    Pulmonary exam normal        Cardiovascular hypertension, Pt. on medications + CAD, + Past MI and + Peripheral Vascular Disease  Normal cardiovascular exam     Neuro/Psych TIAnegative psych ROS   GI/Hepatic negative GI ROS, Neg liver ROS,   Endo/Other  negative endocrine ROS  Renal/GU Renal InsufficiencyRenal disease     Musculoskeletal  (+) Arthritis , Osteoarthritis,    Abdominal Normal abdominal exam  (+)   Peds  Hematology negative hematology ROS (+)   Anesthesia Other Findings   Reproductive/Obstetrics                             Anesthesia Physical Anesthesia Plan  ASA: III  Anesthesia Plan: General   Post-op Pain Management:    Induction: Intravenous  Airway Management Planned: Oral ETT  Additional Equipment: Arterial line  Intra-op Plan:   Post-operative Plan: Extubation in OR  Informed Consent: I have reviewed the patients History and Physical, chart, labs and discussed the procedure including the risks, benefits and alternatives for the proposed anesthesia with the patient or authorized representative who has indicated his/her understanding and acceptance.   Dental advisory given  Plan Discussed with: CRNA and Surgeon  Anesthesia Plan Comments:         Anesthesia Quick Evaluation

## 2015-11-28 NOTE — Progress Notes (Signed)
MEDICATION RELATED CONSULT NOTE - INITIAL   Pharmacy Consult for antibiotic dose adjustment Indication: surgical prophylaxis  Allergies  Allergen Reactions  . Codeine Other (See Comments)    She states it paralyzes her and she can't move.    Patient Measurements: Height: 5\' 3"  (160 cm) Weight: 155 lb (70.3 kg) IBW/kg (Calculated) : 52.4   Vital Signs: Temp: 97.8 F (36.6 C) (08/24 1516) Temp Source: Oral (08/24 1126) BP: 99/65 (08/24 1615) Pulse Rate: 60 (08/24 1615) Intake/Output from previous day: No intake/output data recorded. Intake/Output from this shift: Total I/O In: 2000 [I.V.:2000] Out: 300 [Urine:200; Blood:100]  Labs: No results for input(s): WBC, HGB, HCT, PLT, APTT, CREATININE, LABCREA, CREATININE, CREAT24HRUR, MG, PHOS, ALBUMIN, PROT, ALBUMIN, AST, ALT, ALKPHOS, BILITOT, BILIDIR, IBILI in the last 72 hours. Estimated Creatinine Clearance: 33.1 mL/min (by C-G formula based on SCr of 1.49 mg/dL).   Microbiology: Recent Results (from the past 720 hour(s))  Surgical pcr screen     Status: None   Collection Time: 11/22/15  9:14 AM  Result Value Ref Range Status   MRSA, PCR NEGATIVE NEGATIVE Final   Staphylococcus aureus NEGATIVE NEGATIVE Final    Comment:        The Xpert SA Assay (FDA approved for NASAL specimens in patients over 53 years of age), is one component of a comprehensive surveillance program.  Test performance has been validated by [facility] for patients greater than or equal to 23 year old. It is not intended to diagnose infection nor to guide or monitor treatment.     Medical History: Past Medical History:  Diagnosis Date  . Arthritis   . Basal cell carcinoma of skin   . Chronic kidney disease    Stage 3 Chronic Kidney Disease  . Coronary artery disease   . Hyperlipidemia   . Hypertension   . Myocardial infarction ([place]) 07/2009  . Peripheral vascular disease ([place])   . Psoriasis   . Sleep apnea   . Squamous cell skin  cancer   . TIA (transient ischemic attack) 2014    Medications:  Scheduled:  . [START ON 11/29/2015] amLODipine  10 mg Oral Daily  . aspirin EC  81 mg Oral Daily  . atorvastatin  40 mg Oral QHS  . ceFAZolin      . cefUROXime (ZINACEF)  IV  1.5 g Intravenous Q12H  . [START ON 11/29/2015] clopidogrel  75 mg Oral Daily  . [START ON 11/29/2015] docusate sodium  100 mg Oral Daily  . famotidine (PEPCID) IV  20 mg Intravenous Q2000  . fentaNYL      . [START ON 11/29/2015] fluticasone  2 spray Each Nare Daily  . hydrALAZINE  25 mg Oral BID  . [START ON 11/29/2015] isosorbide mononitrate  30 mg Oral Daily  . sodium chloride flush       Infusions:  . sodium chloride 75 mL/hr at 11/28/15 1742  . DOPamine Stopped (11/28/15 1742)  . esmolol Stopped (11/28/15 1753)  . nitroGLYCERIN Stopped (11/28/15 1753)    Assessment: 70 yo female who had a procedure for a left carotid stenosis and is receiving surgical prophylaxis with cefuroxime.   Plan:  No dose adjustments necessary at this time. Continue current therapy.   Loree Fee 11/28/2015,5:47 PM

## 2015-11-29 ENCOUNTER — Encounter: Payer: Self-pay | Admitting: Vascular Surgery

## 2015-11-29 LAB — BASIC METABOLIC PANEL
ANION GAP: 7 (ref 5–15)
BUN: 28 mg/dL — ABNORMAL HIGH (ref 6–20)
CALCIUM: 8.7 mg/dL — AB (ref 8.9–10.3)
CO2: 24 mmol/L (ref 22–32)
Chloride: 105 mmol/L (ref 101–111)
Creatinine, Ser: 1.19 mg/dL — ABNORMAL HIGH (ref 0.44–1.00)
GFR, EST AFRICAN AMERICAN: 52 mL/min — AB (ref >60)
GFR, EST NON AFRICAN AMERICAN: 45 mL/min — AB (ref >60)
Glucose, Bld: 132 mg/dL — ABNORMAL HIGH (ref 65–99)
Potassium: 4.7 mmol/L (ref 3.5–5.1)
Sodium: 136 mmol/L (ref 135–145)

## 2015-11-29 LAB — GLUCOSE, CAPILLARY: GLUCOSE-CAPILLARY: 104 mg/dL — AB (ref 65–99)

## 2015-11-29 LAB — CBC
HEMATOCRIT: 32.1 % — AB (ref 35.0–47.0)
Hemoglobin: 11 g/dL — ABNORMAL LOW (ref 12.0–16.0)
MCH: 30.4 pg (ref 26.0–34.0)
MCHC: 34.3 g/dL (ref 32.0–36.0)
MCV: 88.7 fL (ref 80.0–100.0)
PLATELETS: 214 10*3/uL (ref 150–440)
RBC: 3.62 MIL/uL — ABNORMAL LOW (ref 3.80–5.20)
RDW: 14.9 % — AB (ref 11.5–14.5)
WBC: 10.6 10*3/uL (ref 3.6–11.0)

## 2015-11-29 MED ORDER — OXYCODONE-ACETAMINOPHEN 5-325 MG PO TABS: 1.0000 | ORAL_TABLET | Freq: Four times a day (QID) | ORAL | 0 refills | Status: DC | PRN

## 2015-11-29 NOTE — Progress Notes (Signed)
[facility]  Daily Progress Note   Subjective: 1 Day Post-Op: Left carotid endarterectomy with CorMatrix arterial patch reconstruction  Patient without complaint this AM - just some incisional soreness. Foley removed - urinating on her own. Awaiting for breakfast to arrive and to get OOB and ambulate.   Objective: Vitals:   11/29/15 0600 11/29/15 0700 11/29/15 0800 11/29/15 0900  BP: 119/68 117/68 123/77 127/70  Pulse: 65 67 63 (!) 56  Resp: 13 15 13 15   Temp:      TempSrc:      SpO2: 93% 92% 94% 96%  Weight:      Height:        Intake/Output Summary (Last 24 hours) at 11/29/15 0957 Last data filed at 11/29/15 0929  Gross per 24 hour  Intake             3390 ml  Output             1650 ml  Net             1740 ml    Physical Exam: A&Ox3, NAD Face: No facial droop, tongue midline, smile symmetrical Neck: Incision with minimal ecchymosis and swelling. Clean, dry and intact.  CV: RRR Pulmonary: CTA Bilaterally Abdomen: Soft, Nontender, Nondistended Vascular: Bilateral lower extremity, warm, non-tender, minimal edema Neuro: Motor / sensory intact. Strength upper / lower 5/5.    Laboratory: CBC    Component Value Date/Time   WBC 10.6 11/29/2015 0418   HGB 11.0 (L) 11/29/2015 0418   HCT 32.1 (L) 11/29/2015 0418   PLT 214 11/29/2015 0418   BMET    Component Value Date/Time   NA 136 11/29/2015 0418   K 4.7 11/29/2015 0418   CL 105 11/29/2015 0418   CO2 24 11/29/2015 0418   GLUCOSE 132 (H) 11/29/2015 0418   BUN 28 (H) 11/29/2015 0418   CREATININE 1.19 (H) 11/29/2015 0418   CALCIUM 8.7 (L) 11/29/2015 0418   GFRNONAA 45 (L) 11/29/2015 0418   GFRAA 52 (L) 11/29/2015 0418   Assessment/Planning: 70 year old female s/p 1 Day Post-Op: Left carotid endarterectomy with CorMatrix arterial patch reconstruction doing well. 1) Passed TOV 2) No neurological deficits on physical exam 3) If patient tolerates diet and ambulates independently will be OK to  discharge home.   Marcelle Overlie PA-C 11/29/2015 9:57 AM

## 2015-11-29 NOTE — Care Management (Signed)
I met with patient, one of her sons, his wife and their son to discuss discharge planning. Patient is from home alone where she lives independently. She required no DME. She drives. She will be going to this son's address at discharge for a few weeks. Her PCP is Dr. Netty Starring. She denies difficulty obtaining medications. She denies RNCM needs. Case closed.

## 2015-11-29 NOTE — Discharge Summary (Signed)
[place]    Discharge Summary    Patient ID:  IMMACOLATA WOLFERT MRN: [id number] DOB/AGE: 70-Nov-1947 70 y.o.  Admit date: 11/28/2015 Discharge date: 11/29/2015 Date of Surgery: 11/28/2015 Surgeon: Surgeon(s): Algernon Huxley, MD  Admission Diagnosis: CAROTID ARTERY STENOSIS  Discharge Diagnoses:  CAROTID ARTERY STENOSIS  Secondary Diagnoses: Past Medical History:  Diagnosis Date  . Arthritis   . Basal cell carcinoma of skin   . Chronic kidney disease    Stage 3 Chronic Kidney Disease  . Coronary artery disease   . Hyperlipidemia   . Hypertension   . Myocardial infarction ([place]) 07/2009  . Peripheral vascular disease ([place])   . Psoriasis   . Sleep apnea   . Squamous cell skin cancer   . TIA (transient ischemic attack) 2014    Procedure(s): ENDARTERECTOMY CAROTID  Discharged Condition: good  HPI:  Lisa Wilkerson is a 70 year old female who presents with Left carotid stenosis of >80%. On 11/28/15, the patient underwent a left carotid endarterectomy. She tolerated the procedure well without any issue. Her night of surgery was unremarkable. POD#1 her diet was advanced, her foley was removed and she was ambulating independently. Her pain was appropriately controlled with PO medication.   Hospital Course:  BRITNE BEUS is a 70 y.o. female is S/P Left Procedure(s): ENDARTERECTOMY CAROTID  Extubated: POD # 0  Physical exam:  A&Ox3, NAD Face: No facial droop, tongue midline, smile symmetrical Neck: Incision with minimal ecchymosis and swelling. Clean, dry and intact.  CV: RRR Pulmonary: CTA Bilaterally Abdomen: Soft, Nontender, Nondistended Vascular: Bilateral lower extremity, warm, non-tender, minimal edema Neuro: Motor / sensory intact. Strength upper / lower 5/5.   Post-op wounds clean, dry, intact or healing well  Pt. Ambulating, voiding and taking PO diet without difficulty.  Pt pain controlled with PO pain meds.  Labs as  below  Complications: None  Consults:  None  Significant Diagnostic Studies: CBC Lab Results  Component Value Date   WBC 10.6 11/29/2015   HGB 11.0 (L) 11/29/2015   HCT 32.1 (L) 11/29/2015   MCV 88.7 11/29/2015   PLT 214 11/29/2015   BMET    Component Value Date/Time   NA 136 11/29/2015 0418   K 4.7 11/29/2015 0418   CL 105 11/29/2015 0418   CO2 24 11/29/2015 0418   GLUCOSE 132 (H) 11/29/2015 0418   BUN 28 (H) 11/29/2015 0418   CREATININE 1.19 (H) 11/29/2015 0418   CALCIUM 8.7 (L) 11/29/2015 0418   GFRNONAA 45 (L) 11/29/2015 0418   GFRAA 52 (L) 11/29/2015 0418   COAG Lab Results  Component Value Date   INR 0.87 11/22/2015   Disposition:  Discharge to :Home    Medication List    TAKE these medications   amLODipine 10 MG tablet Commonly known as:  NORVASC Take 10 mg by mouth daily.   aspirin 81 MG tablet Take 81 mg by mouth daily.   atorvastatin 40 MG tablet Commonly known as:  LIPITOR Take 40 mg by mouth daily.   clobetasol ointment 0.05 % Commonly known as:  TEMOVATE Apply 1 application topically 2 (two) times daily as needed.   clopidogrel 75 MG tablet Commonly known as:  PLAVIX Take 75 mg by mouth daily.   clotrimazole-betamethasone cream Commonly known as:  LOTRISONE Apply 1 application topically 2 (two) times daily as needed.   COSENTYX 150 MG/ML [place] Generic drug:  Secukinumab Inject into the skin.   fluticasone 50 MCG/ACT nasal spray  Commonly known as:  FLONASE instill 2 sprays into each nostril once daily if needed for RHINITIS.   hydrALAZINE 25 MG tablet Commonly known as:  APRESOLINE Take 25 mg by mouth 2 (two) times daily.   isosorbide mononitrate 30 MG 24 hr tablet Commonly known as:  IMDUR Take 30 mg by mouth daily.   oxyCODONE-acetaminophen 5-325 MG tablet Commonly known as:  ROXICET Take 1 tablet by mouth every 6 (six) hours as needed for severe pain.   traMADol 50 MG tablet Commonly known as:  ULTRAM Take 50 mg by  mouth every 6 (six) hours as needed for moderate pain.      Verbal and written Discharge instructions given to the patient. Wound care per Discharge AVS Follow-up Information    DEW,JASON, MD Follow up in 2 week(s).   Specialties:  Vascular Surgery, Radiology, Interventional Cardiology Why:  First CEA post-op check Contact information: [place] [id number] [phone]          Signed: Sela Hua, PA-C  11/29/2015, 10:14 AM

## 2015-11-29 NOTE — Discharge Instructions (Signed)
You may shower as of Sunday. Please keep incision clean and dry. No driving while on pain medication or until you follow up with [place] in our office. Please restart your aspirin and Plavix.

## 2015-12-02 ENCOUNTER — Emergency Department: Payer: PPO

## 2015-12-02 ENCOUNTER — Emergency Department
Admission: EM | Admit: 2015-12-02 | Discharge: 2015-12-02 | Disposition: A | Discharge status: Home / Self Care | Payer: PPO | Attending: Emergency Medicine | Admitting: Emergency Medicine

## 2015-12-02 ENCOUNTER — Encounter: Payer: Self-pay | Admitting: Emergency Medicine

## 2015-12-02 DIAGNOSIS — Z7982 Long term (current) use of aspirin: Secondary | ICD-10-CM | POA: Diagnosis not present

## 2015-12-02 DIAGNOSIS — F17210 Nicotine dependence, cigarettes, uncomplicated: Secondary | ICD-10-CM | POA: Insufficient documentation

## 2015-12-02 DIAGNOSIS — I10 Essential (primary) hypertension: Secondary | ICD-10-CM | POA: Diagnosis not present

## 2015-12-02 DIAGNOSIS — I252 Old myocardial infarction: Secondary | ICD-10-CM | POA: Insufficient documentation

## 2015-12-02 DIAGNOSIS — R51 Headache: Secondary | ICD-10-CM | POA: Insufficient documentation

## 2015-12-02 DIAGNOSIS — I129 Hypertensive chronic kidney disease with stage 1 through stage 4 chronic kidney disease, or unspecified chronic kidney disease: Secondary | ICD-10-CM | POA: Diagnosis not present

## 2015-12-02 DIAGNOSIS — Z79899 Other long term (current) drug therapy: Secondary | ICD-10-CM | POA: Insufficient documentation

## 2015-12-02 DIAGNOSIS — Z9889 Other specified postprocedural states: Secondary | ICD-10-CM

## 2015-12-02 DIAGNOSIS — Z8673 Personal history of transient ischemic attack (TIA), and cerebral infarction without residual deficits: Secondary | ICD-10-CM | POA: Insufficient documentation

## 2015-12-02 DIAGNOSIS — Z8679 Personal history of other diseases of the circulatory system: Secondary | ICD-10-CM | POA: Insufficient documentation

## 2015-12-02 DIAGNOSIS — R519 Headache, unspecified: Secondary | ICD-10-CM

## 2015-12-02 DIAGNOSIS — I2510 Atherosclerotic heart disease of native coronary artery without angina pectoris: Secondary | ICD-10-CM | POA: Diagnosis not present

## 2015-12-02 DIAGNOSIS — R9431 Abnormal electrocardiogram [ECG] [EKG]: Secondary | ICD-10-CM | POA: Diagnosis not present

## 2015-12-02 DIAGNOSIS — N183 Chronic kidney disease, stage 3 (moderate): Secondary | ICD-10-CM | POA: Diagnosis not present

## 2015-12-02 LAB — COMPREHENSIVE METABOLIC PANEL
ALK PHOS: 98 U/L (ref 38–126)
ALT: 10 U/L — AB (ref 14–54)
AST: 15 U/L (ref 15–41)
Albumin: 3.5 g/dL (ref 3.5–5.0)
Anion gap: 11 (ref 5–15)
BILIRUBIN TOTAL: 1.1 mg/dL (ref 0.3–1.2)
BUN: 21 mg/dL — AB (ref 6–20)
CALCIUM: 9.5 mg/dL (ref 8.9–10.3)
CO2: 26 mmol/L (ref 22–32)
CREATININE: 1.08 mg/dL — AB (ref 0.44–1.00)
Chloride: 99 mmol/L — ABNORMAL LOW (ref 101–111)
GFR calc Af Amer: 59 mL/min — ABNORMAL LOW (ref >60)
GFR, EST NON AFRICAN AMERICAN: 51 mL/min — AB (ref >60)
Glucose, Bld: 105 mg/dL — ABNORMAL HIGH (ref 65–99)
Potassium: 3.2 mmol/L — ABNORMAL LOW (ref 3.5–5.1)
Sodium: 136 mmol/L (ref 135–145)
TOTAL PROTEIN: 7.7 g/dL (ref 6.5–8.1)

## 2015-12-02 LAB — CBC WITH DIFFERENTIAL/PLATELET
BASOS ABS: 0 10*3/uL (ref 0–0.1)
Basophils Relative: 0 %
EOS ABS: 0 10*3/uL (ref 0–0.7)
EOS PCT: 0 %
HEMATOCRIT: 37.9 % (ref 35.0–47.0)
HEMOGLOBIN: 12.7 g/dL (ref 12.0–16.0)
LYMPHS PCT: 9 %
Lymphs Abs: 1.2 10*3/uL (ref 1.0–3.6)
MCH: 29.7 pg (ref 26.0–34.0)
MCHC: 33.7 g/dL (ref 32.0–36.0)
MCV: 88.2 fL (ref 80.0–100.0)
MONO ABS: 1 10*3/uL — AB (ref 0.2–0.9)
Monocytes Relative: 7 %
Neutro Abs: 11.6 10*3/uL — ABNORMAL HIGH (ref 1.4–6.5)
Neutrophils Relative %: 84 %
Platelets: 260 10*3/uL (ref 150–440)
RBC: 4.29 MIL/uL (ref 3.80–5.20)
RDW: 14.9 % — AB (ref 11.5–14.5)
WBC: 13.9 10*3/uL — ABNORMAL HIGH (ref 3.6–11.0)

## 2015-12-02 LAB — TROPONIN I

## 2015-12-02 MED ORDER — MORPHINE SULFATE (PF) 4 MG/ML IV SOLN
4.0000 mg | Freq: Once | INTRAVENOUS | Status: AC
  Administered 2015-12-02: 4 mg via INTRAVENOUS
  Filled 2015-12-02: qty 1

## 2015-12-02 MED ORDER — HYDRALAZINE HCL 20 MG/ML IJ SOLN
10.0000 mg | Freq: Once | INTRAMUSCULAR | Status: AC
  Administered 2015-12-02: 10 mg via INTRAVENOUS
  Filled 2015-12-02: qty 1

## 2015-12-02 MED ORDER — IOPAMIDOL (ISOVUE-370) INJECTION 76%
60.0000 mL | Freq: Once | INTRAVENOUS | Status: AC | PRN
  Administered 2015-12-02: 60 mL via INTRAVENOUS

## 2015-12-02 MED ORDER — PROMETHAZINE HCL 25 MG/ML IJ SOLN
12.5000 mg | Freq: Once | INTRAMUSCULAR | Status: AC
Start: 2015-12-02 — End: 2015-12-02
  Administered 2015-12-02: 12.5 mg via INTRAVENOUS
  Filled 2015-12-02: qty 1

## 2015-12-02 MED ORDER — DEXAMETHASONE SODIUM PHOSPHATE 10 MG/ML IJ SOLN
10.0000 mg | Freq: Once | INTRAMUSCULAR | Status: AC
  Administered 2015-12-02: 10 mg via INTRAVENOUS
  Filled 2015-12-02: qty 1

## 2015-12-02 NOTE — ED Notes (Signed)
Pt with left neck incision with edges approximated, resolving bruising, moderate swelling. No s/s of infection, airway compromise, graft failure. Pt a/o, perrl.

## 2015-12-02 NOTE — ED Provider Notes (Signed)
[facility] Provider Note   CSN: [id number] Arrival date & time: 12/02/15  1038     History   Chief Complaint Chief Complaint  Patient presents with  . Headache    HPI Lisa Wilkerson is a 70 y.o. female hx of CAD, HTN, MI, previous TIA on plavix, ASA, endarterectomy L carotid on 8/24 here with headaches, hypertension, nausea, vomiting. She states that she had endarterectomy by Dr. Lucky Cowboy on 8/24 and was observed in the ICU overnight. She was started on plavix again. She is feeling well until yesterday when she has some nausea and vomiting. Also has some diffuse headache as well. She does notice that she has some ecchymosis and swelling on the incision site that is oozing down her neck. Denies any fevers or chills or trouble swallowing. Denies any weakness or numbness or trouble speaking.    The history is provided by the patient.    Past Medical History:  Diagnosis Date  . Arthritis   . Basal cell carcinoma of skin   . Chronic kidney disease    Stage 3 Chronic Kidney Disease  . Coronary artery disease   . Hyperlipidemia   . Hypertension   . Myocardial infarction ([place]) 07/2009  . Peripheral vascular disease ([place])   . Psoriasis   . Sleep apnea   . Squamous cell skin cancer   . TIA (transient ischemic attack) 2014    Patient Active Problem List   Diagnosis Date Noted  . Carotid stenosis 11/28/2015    Past Surgical History:  Procedure Laterality Date  . CARDIAC CATHETERIZATION    . ENDARTERECTOMY Left 11/28/2015   Procedure: ENDARTERECTOMY CAROTID;  Surgeon: Algernon Huxley, MD;  Location: [facility] ORS;  Service: Vascular;  Laterality: Left;  . EYE SURGERY Bilateral    Cataract Extraction with IOL  . UVULOPALATOPHARYNGOPLASTY      OB History    No data available       Home Medications    Prior to Admission medications   Medication Sig Start Date End Date Taking? Authorizing Provider  amLODipine (NORVASC) 10 MG tablet Take 10 mg by mouth daily. 10/16/15    Historical Provider, MD  aspirin 81 MG tablet Take 81 mg by mouth daily.    Historical Provider, MD  atorvastatin (LIPITOR) 40 MG tablet Take 40 mg by mouth daily. 10/16/15   Historical Provider, MD  clobetasol ointment (TEMOVATE) [id number] % Apply 1 application topically 2 (two) times daily as needed.     Historical Provider, MD  clopidogrel (PLAVIX) 75 MG tablet Take 75 mg by mouth daily. 10/16/15   Historical Provider, MD  clotrimazole-betamethasone (LOTRISONE) cream Apply 1 application topically 2 (two) times daily as needed.     Historical Provider, MD  fluticasone (FLONASE) 50 MCG/ACT nasal spray instill 2 sprays into each nostril once daily if needed for RHINITIS. 10/16/15   Historical Provider, MD  hydrALAZINE (APRESOLINE) 25 MG tablet Take 25 mg by mouth 2 (two) times daily.    Historical Provider, MD  isosorbide mononitrate (IMDUR) 30 MG 24 hr tablet Take 30 mg by mouth daily. 10/16/15   Historical Provider, MD  oxyCODONE-acetaminophen (ROXICET) 5-325 MG tablet Take 1 tablet by mouth every 6 (six) hours as needed for severe pain. 11/29/15   Kimberly A Stegmayer, PA-C  Secukinumab (COSENTYX) 150 MG/ML SOSY Inject into the skin.    Historical Provider, MD  traMADol (ULTRAM) 50 MG tablet Take 50 mg by mouth every 6 (six) hours as needed for moderate pain.  Historical Provider, MD    Family History History reviewed. No pertinent family history.  Social History Social History  Substance Use Topics  . Smoking status: Current Every Day Smoker    Packs/day: 0.50    Types: Cigarettes  . Smokeless tobacco: Never Used  . Alcohol use No     Allergies   Codeine   Review of Systems Review of Systems  Neurological: Positive for headaches.  All other systems reviewed and are negative.    Physical Exam Updated Vital Signs BP 133/86 (BP Location: Right Arm)   Pulse 74   Temp 98 F (36.7 C) (Oral)   Resp 16   Ht 5\' 3"  (1.6 m)   Wt 155 lb (70.3 kg)   SpO2 97%   BMI 27.46 kg/m    Physical Exam  Constitutional: She is oriented to person, place, and time.  Chronically ill, uncomfortable   HENT:  Head: Normocephalic and atraumatic.  Eyes: EOM are normal. Pupils are equal, round, and reactive to light.  Neck:  L endarterectomy site with swelling and ecchymosis spreading to the lower neck and R neck as well   Cardiovascular: Normal rate, regular rhythm and normal heart sounds.   Pulmonary/Chest: Effort normal and breath sounds normal.  Abdominal: Soft. Bowel sounds are normal.  Musculoskeletal: Normal range of motion.  Neurological: She is alert and oriented to person, place, and time.  CN 2-12 intact. Nl strength throughout   Skin: Skin is warm.  Psychiatric: She has a normal mood and affect.  Nursing note and vitals reviewed.    ED Treatments / Results  Labs (all labs ordered are listed, but only abnormal results are displayed) Labs Reviewed  CBC WITH DIFFERENTIAL/PLATELET - Abnormal; Notable for the following:       Result Value   WBC 13.9 (*)    RDW 14.9 (*)    Neutro Abs 11.6 (*)    Monocytes Absolute 1.0 (*)    All other components within normal limits  COMPREHENSIVE METABOLIC PANEL - Abnormal; Notable for the following:    Potassium 3.2 (*)    Chloride 99 (*)    Glucose, Bld 105 (*)    BUN 21 (*)    Creatinine, Ser 1.08 (*)    ALT 10 (*)    GFR calc non Af Amer 51 (*)    GFR calc Af Amer 59 (*)    All other components within normal limits  TROPONIN I    EKG  EKG Interpretation  Date/Time:  Monday December 02 2015 11:02:17 EDT Ventricular Rate:  77 PR Interval:    QRS Duration: 95 QT Interval:  405 QTC Calculation: 459 R Axis:   63 Text Interpretation:  Sinus rhythm Anteroseptal infarct, age indeterminate ----------unconfirmed---------- Confirmed by OVERREAD, NOT ([id number]), editor PEARSON, BARBARA ([id number]) on 12/02/2015 11:41:04 AM      ED ECG REPORT I, Wandra Arthurs, the attending physician, personally viewed and interpreted this  ECG.   Date: 12/02/2015  EKG Time: 11: 02  Rate: 77  Rhythm: normal EKG, normal sinus rhythm  Axis: normal  Intervals:none  ST&T Change: nonspecific     Radiology Ct Angio Head W Or Wo Contrast  Result Date: 12/02/2015 CLINICAL DATA:  Persistent headaches following left carotid endarterectomy 11/28/2015 EXAM: CT ANGIOGRAPHY HEAD AND NECK TECHNIQUE: Multidetector CT imaging of the head and neck was performed using the standard protocol during bolus administration of intravenous contrast. Multiplanar CT image reconstructions and MIPs were obtained to evaluate the vascular anatomy. Carotid  stenosis measurements (when applicable) are obtained utilizing NASCET criteria, using the distal internal carotid diameter as the denominator. CONTRAST:  60 mL Isovue 370 COMPARISON:  CTA neck 11/12/2015 FINDINGS: CT HEAD Brain: Mild atrophy and white matter changes are noted bilaterally. No acute cortical infarct, hemorrhage, or mass lesion is present. The basal ganglia are intact. Subcortical white matter changes are slightly worse on the left. The ventricles are of normal size. No significant extra-axial fluid collection is present. Calvarium and skull base: The calvarium is intact. Paranasal sinuses: Chronic right maxillary sinus disease is noted. The remaining paranasal sinuses and the left mastoid air cells are clear. A right mastoid and middle ear effusion is noted. Orbits: Bilateral lens replacements are present. The globes and orbits are otherwise within normal limits. CTA NECK Aortic arch: A 3 vessel arch configuration is present. Atherosclerotic calcifications are again noted at the aortic arch without significant stenosis at the origins the great vessels. There is no aneurysm. Right carotid system: The right common carotid artery demonstrates minimal tortuosity without significant stenosis. Atherosclerotic changes are present at the right carotid bifurcation without significant stenosis. There is mild  tortuosity of the mid cervical right ICA. Mural calcification is noted along the medial aspect of the distal right ICA just below the skullbase. Left carotid system: The left common carotid artery is within normal limits. Left endarterectomy is now noted. There is no residual or recurrent stenosis. Mild tortuosity is present in the cervical left ICA without stenosis. The caliber of the distal left ICA has increased from 4.8 to 6.0 mm. Vertebral arteries:Atherosclerotic changes are present at the origins of both vertebral arteries. Both vertebral arteries originate from the subclavian arteries. There is mild narrowing on the right and moderate narrowing on the left at the origins of the vertebral arteries. Additional atherosclerotic changes are present more distally in both vertebral arteries without other stenoses. Skeleton: Endplate degenerative change in uncovertebral spurring is worse right than left at C4-5 and C5-6. Osseous foraminal narrowing is present on the right at both levels. Vertebral body heights and alignment are maintained. No focal lytic or blastic lesions are present. Other neck: There is persisting gas surrounding the left endarterectomy, within normal limits. There is some fluid surrounding the operative site as well, also likely within normal limits. No significant adenopathy is present. The thyroid is within normal limits. Edematous changes are noted about the left submandibular gland with gas extending into the left submandibular and masticator space. Edematous changes are noted in the oral pharynx. The remaining salivary glands are within normal limits. Upper chest: 11 mm paraesophageal lymph node is noted at the thoracic inlet. This is slightly increased in size to the prior study. No other significant adenopathy is present. Atherosclerotic changes are again seen. The trachea is unremarkable. The lung apices demonstrate mild centrilobular emphysema. No focal nodule, mass, or airspace disease  is present. CTA HEAD Anterior circulation: Atherosclerotic changes are present within the cavernous internal carotid arteries bilaterally. There is no significant focal stenosis. The internal carotid arteries are otherwise within normal limits through the ICA termini bilaterally. The A1 and M1 segments are within normal limits. The right A1 segment is dominant. The anterior communicating artery is patent. The MCA bifurcations are intact. There is mild irregularity in distal ACA and MCA branch vessels bilaterally. Attenuation is worse right than left. This may reflect increased perfusion within the left MCA branch vessels following endarterectomy. There is no aneurysm or focal significant stenosis. Posterior circulation: Atherosclerotic changes are present  at the dural margin the left vertebral artery with mild narrowing. There is mild narrowing of the proximal basilar artery is well. The basilar tip is intact. Both posterior cerebral arteries originate from the basilar tip. The PCA branch vessels are within normal limits. Venous sinuses: The dural sinuses are patent. The transverse sinuses are codominant. The straight sinus and deep cerebral veins are intact. Cortical veins are within normal limits. Anatomic variants: None. Delayed phase: The postcontrast images demonstrate no pathologic enhancement. IMPRESSION: 1. Left carotid bifurcation endarterectomy without residual or recurrent stenosis. 2. Asymmetric caliber scratched increased caliber of the distal left internal carotid artery and asymmetric caliber of left MCA branch vessels compared to the right compatible with increased perfusion pressures since the prior exam. This may result in hyper perfusion to the left hemisphere in be related to the patient's headaches. 3. Atherosclerotic changes in the vertebral arteries and at the right carotid bifurcation as described. 4. No evidence for aneurysm or hemorrhage. 5. Moderate atrophy and white matter disease. White  matter disease is slightly worse left than right. 6. No acute intracranial abnormality. 7. 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 Electronically Signed   By: San Morelle M.D.   On: 12/02/2015 12:40   Ct Angio Neck W And/or Wo Contrast  Result Date: 12/02/2015 CLINICAL DATA:  Persistent headaches following left carotid endarterectomy 11/28/2015 EXAM: CT ANGIOGRAPHY HEAD AND NECK TECHNIQUE: Multidetector CT imaging of the head and neck was performed using the standard protocol during bolus administration of intravenous contrast. Multiplanar CT image reconstructions and MIPs were obtained to evaluate the vascular anatomy. Carotid stenosis measurements (when applicable) are obtained utilizing NASCET criteria, using the distal internal carotid diameter as the denominator. CONTRAST:  60 mL Isovue 370 COMPARISON:  CTA neck 11/12/2015 FINDINGS: CT HEAD Brain: Mild atrophy and white matter changes are noted bilaterally. No acute cortical infarct, hemorrhage, or mass lesion is present. The basal ganglia are intact. Subcortical white matter changes are slightly worse on the left. The ventricles are of normal size. No significant extra-axial fluid collection is present. Calvarium and skull base: The calvarium is intact. Paranasal sinuses: Chronic right maxillary sinus disease is noted. The remaining paranasal sinuses and the left mastoid air cells are clear. A right mastoid and middle ear effusion is noted. Orbits: Bilateral lens replacements are present. The globes and orbits are otherwise within normal limits. CTA NECK Aortic arch: A 3 vessel arch configuration is present. Atherosclerotic calcifications are again noted at the aortic arch without significant stenosis at the origins the great vessels. There is no aneurysm. Right carotid system: The right common carotid artery demonstrates minimal tortuosity without significant stenosis. Atherosclerotic changes are  present at the right carotid bifurcation without significant stenosis. There is mild tortuosity of the mid cervical right ICA. Mural calcification is noted along the medial aspect of the distal right ICA just below the skullbase. Left carotid system: The left common carotid artery is within normal limits. Left endarterectomy is now noted. There is no residual or recurrent stenosis. Mild tortuosity is present in the cervical left ICA without stenosis. The caliber of the distal left ICA has increased from 4.8 to 6.0 mm. Vertebral arteries:Atherosclerotic changes are present at the origins of both vertebral arteries. Both vertebral arteries originate from the subclavian arteries. There is mild narrowing on the right and moderate narrowing on the left at the origins of the vertebral arteries. Additional atherosclerotic changes are present more distally  in both vertebral arteries without other stenoses. Skeleton: Endplate degenerative change in uncovertebral spurring is worse right than left at C4-5 and C5-6. Osseous foraminal narrowing is present on the right at both levels. Vertebral body heights and alignment are maintained. No focal lytic or blastic lesions are present. Other neck: There is persisting gas surrounding the left endarterectomy, within normal limits. There is some fluid surrounding the operative site as well, also likely within normal limits. No significant adenopathy is present. The thyroid is within normal limits. Edematous changes are noted about the left submandibular gland with gas extending into the left submandibular and masticator space. Edematous changes are noted in the oral pharynx. The remaining salivary glands are within normal limits. Upper chest: 11 mm paraesophageal lymph node is noted at the thoracic inlet. This is slightly increased in size to the prior study. No other significant adenopathy is present. Atherosclerotic changes are again seen. The trachea is unremarkable. The lung apices  demonstrate mild centrilobular emphysema. No focal nodule, mass, or airspace disease is present. CTA HEAD Anterior circulation: Atherosclerotic changes are present within the cavernous internal carotid arteries bilaterally. There is no significant focal stenosis. The internal carotid arteries are otherwise within normal limits through the ICA termini bilaterally. The A1 and M1 segments are within normal limits. The right A1 segment is dominant. The anterior communicating artery is patent. The MCA bifurcations are intact. There is mild irregularity in distal ACA and MCA branch vessels bilaterally. Attenuation is worse right than left. This may reflect increased perfusion within the left MCA branch vessels following endarterectomy. There is no aneurysm or focal significant stenosis. Posterior circulation: Atherosclerotic changes are present at the dural margin the left vertebral artery with mild narrowing. There is mild narrowing of the proximal basilar artery is well. The basilar tip is intact. Both posterior cerebral arteries originate from the basilar tip. The PCA branch vessels are within normal limits. Venous sinuses: The dural sinuses are patent. The transverse sinuses are codominant. The straight sinus and deep cerebral veins are intact. Cortical veins are within normal limits. Anatomic variants: None. Delayed phase: The postcontrast images demonstrate no pathologic enhancement. IMPRESSION: 1. Left carotid bifurcation endarterectomy without residual or recurrent stenosis. 2. Asymmetric caliber scratched increased caliber of the distal left internal carotid artery and asymmetric caliber of left MCA branch vessels compared to the right compatible with increased perfusion pressures since the prior exam. This may result in hyper perfusion to the left hemisphere in be related to the patient's headaches. 3. Atherosclerotic changes in the vertebral arteries and at the right carotid bifurcation as described. 4. No  evidence for aneurysm or hemorrhage. 5. Moderate atrophy and white matter disease. White matter disease is slightly worse left than right. 6. No acute intracranial abnormality. 7. 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 Electronically Signed   By: San Morelle M.D.   On: 12/02/2015 12:40    Procedures Procedures (including critical care time)  Medications Ordered in ED Medications  morphine 4 MG/ML injection 4 mg (4 mg Intravenous Given 12/02/15 1120)  promethazine (PHENERGAN) injection 12.5 mg (12.5 mg Intravenous Given 12/02/15 1118)  hydrALAZINE (APRESOLINE) injection 10 mg (10 mg Intravenous Given 12/02/15 1121)  iopamidol (ISOVUE-370) 76 % injection 60 mL (60 mLs Intravenous Contrast Given 12/02/15 1144)  dexamethasone (DECADRON) injection 10 mg (10 mg Intravenous Given 12/02/15 1331)     Initial Impression / Assessment and Plan / ED Course  I have reviewed  the triage vital signs and the nursing notes.  Pertinent labs & imaging results that were available during my care of the patient were reviewed by me and considered in my medical decision making (see chart for details).  Clinical Course   YEIMI RHOTON is a 70 y.o. female here with headache, neck swelling s/p endarterectomy. Consider hemorrhagic stroke or SAH or ischemic stroke after endarterectomy vs migraines. Has no trouble speaking, has nl neuro exam currently. Will get CT angio head/neck. Will give pain meds.   1:40 pm CT showed increased blood flow as expected. Has some subcutaneous air as expected from open endarterectomy, no obvious contrast extravasation. Consulted Dr. Delana Meyer from vascular, who recommend decadron 10 mg IV. He will talk to Dr. Lucky Cowboy regarding patient.   2:47 PM Dr. Waldron Labs talked to Dr. Lucky Cowboy, who reviewed images. He felt that she has expected postop changes on the CT and headache likely from increase blood flow to the brain. Headaches improved with  steroids, pain meds. Has tramadol and oxycodone at home. Has follow up with Dr. Lucky Cowboy in a week. Dr. Ronalee Belts doesn't want prescription for steroids. Will dc home.    Final Clinical Impressions(s) / ED Diagnoses   Final diagnoses:  None    New Prescriptions New Prescriptions   No medications on file     Drenda Freeze, MD 12/02/15 [phone]

## 2015-12-02 NOTE — ED Triage Notes (Addendum)
Ems from home for headache. Pt s/p left endarterectomy 8/24.

## 2015-12-02 NOTE — Discharge Instructions (Signed)
Take tylenol for pain.   Take your tramadol and oxycodone as prescribed for pain and headaches.   Stay hydrated.   You are expected to have headaches for several more days.   See Dr. Lucky Cowboy as scheduled.   Return to ER if you have severe headaches, vomiting, fever, weakness.

## 2015-12-03 LAB — SURGICAL PATHOLOGY

## 2016-01-16 ENCOUNTER — Encounter: Payer: Self-pay | Admitting: Vascular Surgery

## 2016-02-17 DIAGNOSIS — I10 Essential (primary) hypertension: Secondary | ICD-10-CM | POA: Diagnosis not present

## 2016-02-17 DIAGNOSIS — R7303 Prediabetes: Secondary | ICD-10-CM | POA: Diagnosis not present

## 2016-02-17 DIAGNOSIS — F17200 Nicotine dependence, unspecified, uncomplicated: Secondary | ICD-10-CM | POA: Diagnosis not present

## 2016-02-17 DIAGNOSIS — N183 Chronic kidney disease, stage 3 (moderate): Secondary | ICD-10-CM | POA: Diagnosis not present

## 2016-02-17 DIAGNOSIS — E7800 Pure hypercholesterolemia, unspecified: Secondary | ICD-10-CM | POA: Diagnosis not present

## 2016-02-17 DIAGNOSIS — N760 Acute vaginitis: Secondary | ICD-10-CM | POA: Diagnosis not present

## 2016-02-18 DIAGNOSIS — L400 Psoriasis vulgaris: Secondary | ICD-10-CM | POA: Diagnosis not present

## 2016-03-16 ENCOUNTER — Other Ambulatory Visit (INDEPENDENT_AMBULATORY_CARE_PROVIDER_SITE_OTHER): Payer: Self-pay | Admitting: Vascular Surgery

## 2016-03-16 DIAGNOSIS — I6523 Occlusion and stenosis of bilateral carotid arteries: Secondary | ICD-10-CM

## 2016-03-20 ENCOUNTER — Encounter (INDEPENDENT_AMBULATORY_CARE_PROVIDER_SITE_OTHER): Payer: Self-pay

## 2016-03-20 ENCOUNTER — Ambulatory Visit (INDEPENDENT_AMBULATORY_CARE_PROVIDER_SITE_OTHER): Payer: Self-pay | Admitting: Vascular Surgery

## 2016-04-03 ENCOUNTER — Encounter (INDEPENDENT_AMBULATORY_CARE_PROVIDER_SITE_OTHER): Payer: Self-pay | Admitting: Vascular Surgery

## 2016-04-03 ENCOUNTER — Ambulatory Visit (INDEPENDENT_AMBULATORY_CARE_PROVIDER_SITE_OTHER): Payer: PPO

## 2016-04-03 ENCOUNTER — Ambulatory Visit (INDEPENDENT_AMBULATORY_CARE_PROVIDER_SITE_OTHER): Payer: PPO | Admitting: Vascular Surgery

## 2016-04-03 VITALS — BP 155/82 | HR 64 | Resp 16 | Ht 63.5 in | Wt 157.0 lb

## 2016-04-03 DIAGNOSIS — E785 Hyperlipidemia, unspecified: Secondary | ICD-10-CM | POA: Diagnosis not present

## 2016-04-03 DIAGNOSIS — I10 Essential (primary) hypertension: Secondary | ICD-10-CM

## 2016-04-03 DIAGNOSIS — I6523 Occlusion and stenosis of bilateral carotid arteries: Secondary | ICD-10-CM

## 2016-04-03 NOTE — Progress Notes (Signed)
MRN : [id number]  Lisa Wilkerson is a 70 y.o. (1946-02-25) female who presents with chief complaint of  Chief Complaint  Patient presents with  . Re-evaluation    Carotid ultrasound follow up  .  History of Present Illness: Patient returns today in follow-up of her carotid disease. She has a previous history of TIA, but no recent focal cerebrovascular symptoms. The patient is now 4 months status post left carotid endarterectomy. She is doing well. Her duplex today shows less than 40% right ICA stenosis and a widely patent left carotid endarterectomy.  Current Outpatient Prescriptions  Medication Sig Dispense Refill  . amLODipine (NORVASC) 10 MG tablet Take 10 mg by mouth daily.  0  . aspirin 81 MG tablet Take 81 mg by mouth daily.    Marland Kitchen atorvastatin (LIPITOR) 40 MG tablet Take 40 mg by mouth daily.  0  . clobetasol ointment (TEMOVATE) 1.24 % Apply 1 application topically 2 (two) times daily as needed.     . clopidogrel (PLAVIX) 75 MG tablet Take 75 mg by mouth daily.  0  . clotrimazole-betamethasone (LOTRISONE) cream Apply 1 application topically 2 (two) times daily as needed.     . fluticasone (FLONASE) 50 MCG/ACT nasal spray instill 2 sprays into each nostril once daily if needed for RHINITIS.  0  . hydrALAZINE (APRESOLINE) 25 MG tablet Take 25 mg by mouth 2 (two) times daily.    . isosorbide mononitrate (IMDUR) 30 MG 24 hr tablet Take 30 mg by mouth daily.  0  . oxyCODONE-acetaminophen (ROXICET) 5-325 MG tablet Take 1 tablet by mouth every 6 (six) hours as needed for severe pain. 10 tablet 0  . Secukinumab (COSENTYX) 150 MG/ML SOSY Inject into the skin.    Marland Kitchen traMADol (ULTRAM) 50 MG tablet Take 50 mg by mouth every 6 (six) hours as needed for moderate pain.     No current facility-administered medications for this visit.     Past Medical History:  Diagnosis Date  . Arthritis   . Basal cell carcinoma of skin   . Chronic kidney disease    Stage 3 Chronic Kidney Disease  .  Coronary artery disease   . Hyperlipidemia   . Hypertension   . Myocardial infarction 07/2009  . Peripheral vascular disease ([place])   . Psoriasis   . Sleep apnea   . Squamous cell skin cancer   . TIA (transient ischemic attack) 2014    Past Surgical History:  Procedure Laterality Date  . CARDIAC CATHETERIZATION    . ENDARTERECTOMY Left 11/28/2015   Procedure: ENDARTERECTOMY CAROTID;  Surgeon: Algernon Huxley, MD;  Location: [facility] ORS;  Service: Vascular;  Laterality: Left;  . EYE SURGERY Bilateral    Cataract Extraction with IOL  . UVULOPALATOPHARYNGOPLASTY      Social History Social History  Substance Use Topics  . Smoking status: Current Every Day Smoker    Packs/day: 0.50    Types: Cigarettes  . Smokeless tobacco: Never Used  . Alcohol use No     Family History No bleeding or clotting disorders  Allergies  Allergen Reactions  . Codeine Other (See Comments)    She states it paralyzes her and she can't move.     REVIEW OF SYSTEMS (Negative unless checked)  Constitutional: [] Weight loss  [] Fever  [] Chills Cardiac: [] Chest pain   [] Chest pressure   [] Palpitations   [] Shortness of breath when laying flat   [] Shortness of breath at rest   [] Shortness of breath with exertion.  Vascular:  [] Pain in legs with walking   [] Pain in legs at rest   [] Pain in legs when laying flat   [] Claudication   [] Pain in feet when walking  [] Pain in feet at rest  [] Pain in feet when laying flat   [] History of DVT   [] Phlebitis   [] Swelling in legs   [] Varicose veins   [] Non-healing ulcers Pulmonary:   [] Uses home oxygen   [x] Productive cough   [] Hemoptysis   [] Wheeze  [x] COPD   [] Asthma Neurologic:  [] Dizziness  [] Blackouts   [] Seizures   [] History of stroke   [x] History of TIA  [] Aphasia   [] Temporary blindness   [] Dysphagia   [] Weakness or numbness in arms   [] Weakness or numbness in legs Musculoskeletal:  [] Arthritis   [] Joint swelling   [] Joint pain   [] Low back pain Hematologic:  [] Easy  bruising  [] Easy bleeding   [] Hypercoagulable state   [] Anemic  [] Hepatitis Gastrointestinal:  [] Blood in stool   [] Vomiting blood  [] Gastroesophageal reflux/heartburn   [] Difficulty swallowing. Genitourinary:  [] Chronic kidney disease   [] Difficult urination  [] Frequent urination  [] Burning with urination   [] Blood in urine Skin:  [] Rashes   [] Ulcers   [] Wounds Psychological:  [] History of anxiety   []  History of major depression.  Physical Examination  Vitals:   04/03/16 1040 04/03/16 1042  BP: (!) 144/84 (!) 155/82  Pulse: 64   Resp: 16   Weight: 157 lb (71.2 kg)   Height: 5' 3.5" (1.613 m)    Body mass index is 27.38 kg/m. Gen:  WD/WN, NAD Head: [place]/AT, No temporalis wasting. Ear/Nose/Throat: Hearing grossly intact, nares w/o erythema or drainage, trachea midline Eyes: Conjunctiva clear. Sclera non-icteric Neck: Supple.  No bruit or JVD.  Pulmonary:  Good air movement, equal and clear to auscultation bilaterally.  Cardiac: RRR, normal S1, S2, no Murmurs, rubs or gallops. Vascular: Left CEA incision well healed Vessel Right Left  Radial Palpable Palpable                                   Gastrointestinal: soft, non-tender/non-distended. No guarding/reflex.  Musculoskeletal: M/S 5/5 throughout.  No deformity or atrophy. Neurologic: CN 2-12 intact. Sensation grossly intact in extremities.  Symmetrical.  Speech is fluent. Motor exam as listed above. Psychiatric: Judgment intact, Mood & affect appropriate for pt's clinical situation. Dermatologic: No rashes or ulcers noted.  No cellulitis or open wounds. Lymph : No Cervical, Axillary, or Inguinal lymphadenopathy.     CBC Lab Results  Component Value Date   WBC 13.9 (H) 12/02/2015   HGB 12.7 12/02/2015   HCT 37.9 12/02/2015   MCV 88.2 12/02/2015   PLT 260 12/02/2015    BMET    Component Value Date/Time   NA 136 12/02/2015 1121   K 3.2 (L) 12/02/2015 1121   CL 99 (L) 12/02/2015 1121   CO2 26 12/02/2015 1121     GLUCOSE 105 (H) 12/02/2015 1121   BUN 21 (H) 12/02/2015 1121   CREATININE 1.08 (H) 12/02/2015 1121   CALCIUM 9.5 12/02/2015 1121   GFRNONAA 51 (L) 12/02/2015 1121   GFRAA 59 (L) 12/02/2015 1121   CrCl cannot be calculated (Patient's most recent lab result is older than the maximum 21 days allowed.).  COAG Lab Results  Component Value Date   INR 0.87 11/22/2015    Radiology No results found.   Assessment/Plan Hyperlipidemia lipid control important in reducing the progression  of atherosclerotic disease. Continue statin therapy   Essential hypertension, benign blood pressure control important in reducing the progression of atherosclerotic disease. On appropriate oral medications.   Carotid stenosis The patient is now 4 months status post left carotid endarterectomy. She is doing well. Her duplex today shows less than 40% right ICA stenosis and a widely patent left carotid endarterectomy. Continue aspirin and statin agent. Okay to discontinue Plavix if it is okay with her cardiologist. Return to clinic in 1 year for follow-up duplex.    Leotis Pain, MD  04/03/2016 11:52 AM    This note was created with Dragon medical transcription system.  Any errors from dictation are purely unintentional

## 2016-04-03 NOTE — Assessment & Plan Note (Signed)
The patient is now 4 months status post left carotid endarterectomy. She is doing well. Her duplex today shows less than 40% right ICA stenosis and a widely patent left carotid endarterectomy. Continue aspirin and statin agent. Okay to discontinue Plavix if it is okay with her cardiologist. Return to clinic in 1 year for follow-up duplex.

## 2016-04-03 NOTE — Assessment & Plan Note (Signed)
lipid control important in reducing the progression of atherosclerotic disease. Continue statin therapy  

## 2016-04-03 NOTE — Assessment & Plan Note (Signed)
blood pressure control important in reducing the progression of atherosclerotic disease. On appropriate oral medications.  

## 2016-04-28 DIAGNOSIS — M898X1 Other specified disorders of bone, shoulder: Secondary | ICD-10-CM | POA: Diagnosis not present

## 2016-04-28 DIAGNOSIS — M25512 Pain in left shoulder: Secondary | ICD-10-CM | POA: Diagnosis not present

## 2016-04-29 DIAGNOSIS — R0602 Shortness of breath: Secondary | ICD-10-CM | POA: Diagnosis not present

## 2016-04-29 DIAGNOSIS — I6523 Occlusion and stenosis of bilateral carotid arteries: Secondary | ICD-10-CM | POA: Diagnosis not present

## 2016-04-29 DIAGNOSIS — I2510 Atherosclerotic heart disease of native coronary artery without angina pectoris: Secondary | ICD-10-CM | POA: Diagnosis not present

## 2016-04-29 DIAGNOSIS — E7800 Pure hypercholesterolemia, unspecified: Secondary | ICD-10-CM | POA: Diagnosis not present

## 2016-04-29 DIAGNOSIS — R0789 Other chest pain: Secondary | ICD-10-CM | POA: Diagnosis not present

## 2016-04-29 DIAGNOSIS — I10 Essential (primary) hypertension: Secondary | ICD-10-CM | POA: Diagnosis not present

## 2016-04-30 ENCOUNTER — Emergency Department
Admission: EM | Admit: 2016-04-30 | Discharge: 2016-04-30 | Disposition: A | Discharge status: Home / Self Care | Payer: PPO | Attending: Emergency Medicine | Admitting: Emergency Medicine

## 2016-04-30 ENCOUNTER — Emergency Department: Payer: PPO

## 2016-04-30 ENCOUNTER — Encounter: Payer: Self-pay | Admitting: Emergency Medicine

## 2016-04-30 DIAGNOSIS — M25512 Pain in left shoulder: Secondary | ICD-10-CM | POA: Insufficient documentation

## 2016-04-30 DIAGNOSIS — R0789 Other chest pain: Secondary | ICD-10-CM | POA: Insufficient documentation

## 2016-04-30 DIAGNOSIS — I2510 Atherosclerotic heart disease of native coronary artery without angina pectoris: Secondary | ICD-10-CM | POA: Insufficient documentation

## 2016-04-30 DIAGNOSIS — I129 Hypertensive chronic kidney disease with stage 1 through stage 4 chronic kidney disease, or unspecified chronic kidney disease: Secondary | ICD-10-CM | POA: Diagnosis not present

## 2016-04-30 DIAGNOSIS — Z79899 Other long term (current) drug therapy: Secondary | ICD-10-CM | POA: Insufficient documentation

## 2016-04-30 DIAGNOSIS — Z85820 Personal history of malignant melanoma of skin: Secondary | ICD-10-CM | POA: Insufficient documentation

## 2016-04-30 DIAGNOSIS — M898X1 Other specified disorders of bone, shoulder: Secondary | ICD-10-CM

## 2016-04-30 DIAGNOSIS — Z7982 Long term (current) use of aspirin: Secondary | ICD-10-CM | POA: Diagnosis not present

## 2016-04-30 DIAGNOSIS — F17210 Nicotine dependence, cigarettes, uncomplicated: Secondary | ICD-10-CM | POA: Insufficient documentation

## 2016-04-30 DIAGNOSIS — M791 Myalgia, unspecified site: Secondary | ICD-10-CM

## 2016-04-30 DIAGNOSIS — R079 Chest pain, unspecified: Secondary | ICD-10-CM | POA: Diagnosis not present

## 2016-04-30 DIAGNOSIS — N183 Chronic kidney disease, stage 3 (moderate): Secondary | ICD-10-CM | POA: Diagnosis not present

## 2016-04-30 LAB — BASIC METABOLIC PANEL
ANION GAP: 10 (ref 5–15)
BUN: 22 mg/dL — ABNORMAL HIGH (ref 6–20)
CALCIUM: 9.3 mg/dL (ref 8.9–10.3)
CO2: 27 mmol/L (ref 22–32)
Chloride: 97 mmol/L — ABNORMAL LOW (ref 101–111)
Creatinine, Ser: 1.26 mg/dL — ABNORMAL HIGH (ref 0.44–1.00)
GFR, EST AFRICAN AMERICAN: 48 mL/min — AB (ref >60)
GFR, EST NON AFRICAN AMERICAN: 42 mL/min — AB (ref >60)
Glucose, Bld: 104 mg/dL — ABNORMAL HIGH (ref 65–99)
Potassium: 4.1 mmol/L (ref 3.5–5.1)
SODIUM: 134 mmol/L — AB (ref 135–145)

## 2016-04-30 LAB — CBC
HCT: 40.8 % (ref 35.0–47.0)
HEMOGLOBIN: 13.5 g/dL (ref 12.0–16.0)
MCH: 28.4 pg (ref 26.0–34.0)
MCHC: 33.1 g/dL (ref 32.0–36.0)
MCV: 85.9 fL (ref 80.0–100.0)
PLATELETS: 295 10*3/uL (ref 150–440)
RBC: 4.75 MIL/uL (ref 3.80–5.20)
RDW: 14.9 % — ABNORMAL HIGH (ref 11.5–14.5)
WBC: 9.7 10*3/uL (ref 3.6–11.0)

## 2016-04-30 LAB — TROPONIN I

## 2016-04-30 NOTE — Discharge Instructions (Signed)
You have been seen in the Emergency Department (ED) today for chest pain.  As we have discussed today?s test results are normal, and we believe your pain is due to pain/strain and/or inflammation of the muscles and/or cartilage of your chest wall and your back muscles.  You may take Tylenol according to the label instructions, except do NOT take it together with your Tramadol because the Tramadol includes Tylenol.  Read through the included information for additional treatment recommendations and precautions.  Continue to take your regular medications.   Return to the Emergency Department (ED) if you experience any further chest pain/pressure/tightness, difficulty breathing, or sudden sweating, or other symptoms that concern you.

## 2016-04-30 NOTE — ED Triage Notes (Signed)
Patient presents to ED via POV from home with c/o CP that began last Saturday. Patient saw PCP on Tuesday, chest xray was negative. PCP told patient is was muscular pain. Patient saw cardiologist yesterday, stress test and echo scheduled for feb 9th. Patient A&O x4. Patient had NSTEMI in 2011.

## 2016-04-30 NOTE — ED Provider Notes (Signed)
[facility] Emergency Department Provider Note  ____________________________________________   First MD Initiated Contact with Patient 04/30/16 1807     (approximate)  I have reviewed the triage vital signs and the nursing notes.   HISTORY  Chief Complaint Chest Pain    HPI Lisa Wilkerson is a 71 y.o. female with PMH as listed below including an NSTEMI in 2011 who presents for evaluation of left sided back and chest pain that started about 5 days ago.  She has seen both her PCP and her cardiologist (Dr. Ubaldo Glassing) this week for the same issue.  It started as left scapular pain, highly reproducible with palpation, 5 days ago.  Her PCP obtained radiographs which were normal and explained it was likely MSK pain.  She had a regularly scheduled cards follow up the next day and discussed it with Dr. Ubaldo Glassing who agreed with the MSK pain, but also scheduled an echo and nuclear stress test for about 2 weeks from now.  This morning she was feeling better at first but then felt some sharp left sided anterior chest wall pain which worried her, so she wanted to be evaluated.  Tramadol makes the pain go away completely, but then it comes back.  She takes Plavix chronically.  Denies fever/chills, SOB, abd pain, dysuria.  Pain is moderate and reproduced with ROM of LUE and with palpation.   Past Medical History:  Diagnosis Date  . Arthritis   . Basal cell carcinoma of skin   . Chronic kidney disease    Stage 3 Chronic Kidney Disease  . Coronary artery disease   . Hyperlipidemia   . Hypertension   . Myocardial infarction 07/2009  . Peripheral vascular disease ([place])   . Psoriasis   . Sleep apnea   . Squamous cell skin cancer   . TIA (transient ischemic attack) 2014    Patient Active Problem List   Diagnosis Date Noted  . Hyperlipidemia 04/03/2016  . Essential hypertension, benign 04/03/2016  . Carotid stenosis 11/28/2015    Past Surgical History:  Procedure Laterality  Date  . CARDIAC CATHETERIZATION    . ENDARTERECTOMY Left 11/28/2015   Procedure: ENDARTERECTOMY CAROTID;  Surgeon: Algernon Huxley, MD;  Location: [facility] ORS;  Service: Vascular;  Laterality: Left;  . EYE SURGERY Bilateral    Cataract Extraction with IOL  . UVULOPALATOPHARYNGOPLASTY      Prior to Admission medications   Medication Sig Start Date End Date Taking? Authorizing Provider  amLODipine (NORVASC) 10 MG tablet Take 10 mg by mouth daily. 10/16/15   Historical Provider, MD  aspirin 81 MG tablet Take 81 mg by mouth daily.    Historical Provider, MD  atorvastatin (LIPITOR) 40 MG tablet Take 40 mg by mouth daily. 10/16/15   Historical Provider, MD  clobetasol ointment (TEMOVATE) 3.66 % Apply 1 application topically 2 (two) times daily as needed.     Historical Provider, MD  clopidogrel (PLAVIX) 75 MG tablet Take 75 mg by mouth daily. 10/16/15   Historical Provider, MD  clotrimazole-betamethasone (LOTRISONE) cream Apply 1 application topically 2 (two) times daily as needed.     Historical Provider, MD  fluticasone (FLONASE) 50 MCG/ACT nasal spray instill 2 sprays into each nostril once daily if needed for RHINITIS. 10/16/15   Historical Provider, MD  hydrALAZINE (APRESOLINE) 25 MG tablet Take 25 mg by mouth 2 (two) times daily.    Historical Provider, MD  isosorbide mononitrate (IMDUR) 30 MG 24 hr tablet Take 30 mg by mouth  daily. 10/16/15   Historical Provider, MD  oxyCODONE-acetaminophen (ROXICET) 5-325 MG tablet Take 1 tablet by mouth every 6 (six) hours as needed for severe pain. 11/29/15   Kimberly A Stegmayer, PA-C  Secukinumab (COSENTYX) 150 MG/ML SOSY Inject into the skin.    Historical Provider, MD  traMADol (ULTRAM) 50 MG tablet Take 50 mg by mouth every 6 (six) hours as needed for moderate pain.    Historical Provider, MD    Allergies Codeine  No family history on file.  Social History Social History  Substance Use Topics  . Smoking status: Current Every Day Smoker    Packs/day: 0.50     Types: Cigarettes  . Smokeless tobacco: Never Used  . Alcohol use No    Review of Systems Constitutional: No fever/chills Eyes: No visual changes. ENT: No sore throat. Cardiovascular: Left sided anterior chest wall pain. Respiratory: Denies shortness of breath. Gastrointestinal: No abdominal pain.  No nausea, no vomiting.  No diarrhea.  No constipation. Genitourinary: Negative for dysuria. Musculoskeletal: Left scapular pain Skin: Negative for rash. Neurological: Negative for headaches, focal weakness or numbness.  10-point ROS otherwise negative.  ____________________________________________   PHYSICAL EXAM:  VITAL SIGNS: ED Triage Vitals  Enc Vitals Group     BP 04/30/16 1347 (!) 152/82     Pulse Rate 04/30/16 1347 76     Resp 04/30/16 1347 17     Temp 04/30/16 1347 98 F (36.7 C)     Temp src --      SpO2 04/30/16 1347 95 %     Weight 04/30/16 1348 151 lb (68.5 kg)     Height 04/30/16 1348 5\' 3"  (1.6 m)     Head Circumference --      Peak Flow --      Pain Score 04/30/16 1348 6     Pain Loc --      Pain Edu? --      Excl. in [place]? --     Constitutional: Alert and oriented. Well appearing and in no acute distress. Eyes: Conjunctivae are normal. PERRL. EOMI. Head: Atraumatic. Nose: No congestion/rhinnorhea. Mouth/Throat: Mucous membranes are moist.  Oropharynx non-erythematous. Neck: No stridor.  No meningeal signs.   Cardiovascular: Normal rate, regular rhythm. Good peripheral circulation. Grossly normal heart sounds. Highly reproducible anterior superior chest wall pain on the left side. Respiratory: Normal respiratory effort.  No retractions. Lungs CTAB. Gastrointestinal: Soft and nontender. No distention.  Musculoskeletal: Highly reviewed reproducible to palpation of the particular point along her left scapula as well as Highly reproducible anterior superior chest wall pain on the left side.   No lower extremity tenderness nor edema. No gross deformities of  extremities. Neurologic:  Normal speech and language. No gross focal neurologic deficits are appreciated.  Skin:  Skin is warm, dry and intact. No rash noted. Psychiatric: Mood and affect are normal. Speech and behavior are normal.  ____________________________________________   LABS (all labs ordered are listed, but only abnormal results are displayed)  Labs Reviewed  BASIC METABOLIC PANEL - Abnormal; Notable for the following:       Result Value   Sodium 134 (*)    Chloride 97 (*)    Glucose, Bld 104 (*)    BUN 22 (*)    Creatinine, Ser 1.26 (*)    GFR calc non Af Amer 42 (*)    GFR calc Af Amer 48 (*)    All other components within normal limits  CBC - Abnormal; Notable for the following:  RDW 14.9 (*)    All other components within normal limits  TROPONIN I   ____________________________________________  EKG  ED ECG REPORT I, Allyiah Gartner, the attending physician, personally viewed and interpreted this ECG.  Date: 04/30/2016 EKG Time: 13:41 Rate: 75 Rhythm: normal sinus rhythm QRS Axis: normal Intervals: normal ST/T Wave abnormalities: Non-specific ST segment / T-wave changes, but no evidence of acute ischemia. Conduction Disturbances: none Narrative Interpretation: unremarkable  ____________________________________________  RADIOLOGY   Dg Chest 2 View  Result Date: 04/30/2016 CLINICAL DATA:  Chest pain.  Back pain. EXAM: CHEST  2 VIEW COMPARISON:  06/05/2010.  07/11/2009. FINDINGS: Mediastinum and hilar structures are normal. Pleural-parenchymal density noted over the right mid lung. This consistent with scarring. Small right pleural effusion. IMPRESSION: 1. Density is noted over the right mid lung. This is unchanged from multiple prior exams and consistent with scarring. 2. Small right pleural effusion. No other focal abnormality identified. Electronically Signed   By: Marcello Moores  Register   On: 04/30/2016 14:49     ____________________________________________   PROCEDURES  Procedure(s) performed:   Procedures   Critical Care performed: No ____________________________________________   INITIAL IMPRESSION / ASSESSMENT AND PLAN / ED COURSE  Pertinent labs & imaging results that were available during my care of the patient were reviewed by me and considered in my medical decision making (see chart for details).  The patient is very well-appearing and in no acute distress.  She and her family are laughing and joking with me.  She has at no point had any focal neurological deficits, shortness of breath, or vital sign instability.  There is nothing to make me think that her highly reproducible chest wall and scapular pain represents aortic dissection.  The fact that she has had symptoms for an extended period of time and has a negative troponin and a normal EKG is reassuring.  I provided reassurance and she very much wants to leave and follow up as an outpatient as scheduled.  I think this is appropriate and there is no indication for a repeat troponin.  I gave my usual and customary return precautions.         ____________________________________________  FINAL CLINICAL IMPRESSION(S) / ED DIAGNOSES  Final diagnoses:  Anterior chest wall pain  Pain of left scapula  Muscle pain     MEDICATIONS GIVEN DURING THIS VISIT:  Medications - No data to display   NEW OUTPATIENT MEDICATIONS STARTED DURING THIS VISIT:  New Prescriptions   No medications on file    Modified Medications   No medications on file    Discontinued Medications   No medications on file     Note:  This document was prepared using Dragon voice recognition software and may include unintentional dictation errors.    Hinda Kehr, MD 04/30/16 [phone]

## 2016-05-04 DIAGNOSIS — B029 Zoster without complications: Secondary | ICD-10-CM | POA: Diagnosis not present

## 2016-05-15 DIAGNOSIS — I2510 Atherosclerotic heart disease of native coronary artery without angina pectoris: Secondary | ICD-10-CM | POA: Diagnosis not present

## 2016-05-15 DIAGNOSIS — R0602 Shortness of breath: Secondary | ICD-10-CM | POA: Diagnosis not present

## 2016-06-26 DIAGNOSIS — H906 Mixed conductive and sensorineural hearing loss, bilateral: Secondary | ICD-10-CM | POA: Diagnosis not present

## 2016-06-26 DIAGNOSIS — H6983 Other specified disorders of Eustachian tube, bilateral: Secondary | ICD-10-CM | POA: Diagnosis not present

## 2016-07-01 DIAGNOSIS — N183 Chronic kidney disease, stage 3 (moderate): Secondary | ICD-10-CM | POA: Diagnosis not present

## 2016-07-01 DIAGNOSIS — E7800 Pure hypercholesterolemia, unspecified: Secondary | ICD-10-CM | POA: Diagnosis not present

## 2016-07-01 DIAGNOSIS — I10 Essential (primary) hypertension: Secondary | ICD-10-CM | POA: Diagnosis not present

## 2016-07-01 DIAGNOSIS — F17200 Nicotine dependence, unspecified, uncomplicated: Secondary | ICD-10-CM | POA: Diagnosis not present

## 2016-07-06 ENCOUNTER — Emergency Department
Admission: EM | Admit: 2016-07-06 | Discharge: 2016-07-07 | Disposition: A | Discharge status: Home / Self Care | Payer: PPO | Attending: Emergency Medicine | Admitting: Emergency Medicine

## 2016-07-06 ENCOUNTER — Emergency Department: Payer: PPO

## 2016-07-06 DIAGNOSIS — J40 Bronchitis, not specified as acute or chronic: Secondary | ICD-10-CM | POA: Insufficient documentation

## 2016-07-06 DIAGNOSIS — I2510 Atherosclerotic heart disease of native coronary artery without angina pectoris: Secondary | ICD-10-CM | POA: Insufficient documentation

## 2016-07-06 DIAGNOSIS — F17210 Nicotine dependence, cigarettes, uncomplicated: Secondary | ICD-10-CM | POA: Insufficient documentation

## 2016-07-06 DIAGNOSIS — I129 Hypertensive chronic kidney disease with stage 1 through stage 4 chronic kidney disease, or unspecified chronic kidney disease: Secondary | ICD-10-CM | POA: Insufficient documentation

## 2016-07-06 DIAGNOSIS — Z7982 Long term (current) use of aspirin: Secondary | ICD-10-CM | POA: Insufficient documentation

## 2016-07-06 DIAGNOSIS — Z85828 Personal history of other malignant neoplasm of skin: Secondary | ICD-10-CM | POA: Insufficient documentation

## 2016-07-06 DIAGNOSIS — N183 Chronic kidney disease, stage 3 (moderate): Secondary | ICD-10-CM | POA: Insufficient documentation

## 2016-07-06 DIAGNOSIS — R0602 Shortness of breath: Secondary | ICD-10-CM

## 2016-07-06 LAB — BLOOD GAS, VENOUS
Acid-Base Excess: 5.8 mmol/L — ABNORMAL HIGH (ref 0.0–2.0)
Bicarbonate: 31.7 mmol/L — ABNORMAL HIGH (ref 20.0–28.0)
O2 SAT: 81.2 %
PATIENT TEMPERATURE: 37
PO2 VEN: 45 mmHg (ref 32.0–45.0)
pCO2, Ven: 50 mmHg (ref 44.0–60.0)
pH, Ven: 7.41 (ref 7.250–7.430)

## 2016-07-06 LAB — CBC
HEMATOCRIT: 39.3 % (ref 35.0–47.0)
Hemoglobin: 12.9 g/dL (ref 12.0–16.0)
MCH: 28.6 pg (ref 26.0–34.0)
MCHC: 32.9 g/dL (ref 32.0–36.0)
MCV: 87.2 fL (ref 80.0–100.0)
Platelets: 279 10*3/uL (ref 150–440)
RBC: 4.5 MIL/uL (ref 3.80–5.20)
RDW: 17.8 % — AB (ref 11.5–14.5)
WBC: 18.8 10*3/uL — AB (ref 3.6–11.0)

## 2016-07-06 MED ORDER — IPRATROPIUM-ALBUTEROL 0.5-2.5 (3) MG/3ML IN SOLN
3.0000 mL | Freq: Once | RESPIRATORY_TRACT | Status: AC
  Administered 2016-07-06: 3 mL via RESPIRATORY_TRACT

## 2016-07-06 MED ORDER — IPRATROPIUM-ALBUTEROL 0.5-2.5 (3) MG/3ML IN SOLN
RESPIRATORY_TRACT | Status: AC
  Administered 2016-07-06: 3 mL via RESPIRATORY_TRACT
  Filled 2016-07-06: qty 9

## 2016-07-06 MED ORDER — METHYLPREDNISOLONE SODIUM SUCC 125 MG IJ SOLR
125.0000 mg | Freq: Once | INTRAMUSCULAR | Status: AC
  Administered 2016-07-06: 125 mg via INTRAVENOUS
  Filled 2016-07-06: qty 2

## 2016-07-06 NOTE — ED Triage Notes (Signed)
Pt to triage via wheelchair. Pt reports sob that began tonight at1800.  Pt has a cough.  Pt also reports chest t tightness.    Pt alert.

## 2016-07-06 NOTE — ED Provider Notes (Signed)
[facility] Emergency Department Provider Note   ____________________________________________   First MD Initiated Contact with Patient 07/06/16 2326     (approximate)  I have reviewed the triage vital signs and the nursing notes.   HISTORY  Chief Complaint Respiratory Distress    HPI Lisa Wilkerson is a 71 y.o. female who comes into the hospital today with some shortness of breath. It started really bad tonight. She's had a cold and some congestion for the past couple of days but she's never had anything like this before. The patient is a smoker and doesn't smoke more than about a half pack a day. She denies any fever but has had some chest tightness. Her cough is not productive. She denies any nausea or vomiting but she has had low but a lightheadedness. The patient has some fluid in her ear and is on a prednisone taper for this fluid. The patient could not tolerate the shortness of breath so she decided to come into the hospital today for evaluation.   Past Medical History:  Diagnosis Date  . Arthritis   . Basal cell carcinoma of skin   . Chronic kidney disease    Stage 3 Chronic Kidney Disease  . Coronary artery disease   . Hyperlipidemia   . Hypertension   . Myocardial infarction 07/2009  . Peripheral vascular disease ([place])   . Psoriasis   . Sleep apnea   . Squamous cell skin cancer   . TIA (transient ischemic attack) 2014    Patient Active Problem List   Diagnosis Date Noted  . Hyperlipidemia 04/03/2016  . Essential hypertension, benign 04/03/2016  . Carotid stenosis 11/28/2015    Past Surgical History:  Procedure Laterality Date  . CARDIAC CATHETERIZATION    . ENDARTERECTOMY Left 11/28/2015   Procedure: ENDARTERECTOMY CAROTID;  Surgeon: Algernon Huxley, MD;  Location: [facility] ORS;  Service: Vascular;  Laterality: Left;  . EYE SURGERY Bilateral    Cataract Extraction with IOL  . UVULOPALATOPHARYNGOPLASTY      Prior to Admission  medications   Medication Sig Start Date End Date Taking? Authorizing Provider  albuterol (PROVENTIL HFA;VENTOLIN HFA) 108 (90 Base) MCG/ACT inhaler Inhale 2 puffs into the lungs every 6 (six) hours as needed for wheezing or shortness of breath. 07/07/16   Loney Hering, MD  amLODipine (NORVASC) 10 MG tablet Take 10 mg by mouth daily. 10/16/15   Historical Provider, MD  aspirin 81 MG tablet Take 81 mg by mouth daily.    Historical Provider, MD  atorvastatin (LIPITOR) 40 MG tablet Take 40 mg by mouth daily. 10/16/15   Historical Provider, MD  azithromycin (ZITHROMAX Z-PAK) 250 MG tablet Take 2 tablets (500 mg) on  Day 1,  followed by 1 tablet (250 mg) once daily on Days 2 through 5. 07/07/16 07/12/16  Loney Hering, MD  benzonatate (TESSALON PERLES) 100 MG capsule Take 1 capsule (100 mg total) by mouth every 6 (six) hours as needed for cough. 07/07/16   Loney Hering, MD  clobetasol ointment (TEMOVATE) 5.10 % Apply 1 application topically 2 (two) times daily as needed.     Historical Provider, MD  clopidogrel (PLAVIX) 75 MG tablet Take 75 mg by mouth daily. 10/16/15   Historical Provider, MD  clotrimazole-betamethasone (LOTRISONE) cream Apply 1 application topically 2 (two) times daily as needed.     Historical Provider, MD  fluticasone (FLONASE) 50 MCG/ACT nasal spray instill 2 sprays into each nostril once daily if needed for  RHINITIS. 10/16/15   Historical Provider, MD  hydrALAZINE (APRESOLINE) 25 MG tablet Take 25 mg by mouth 2 (two) times daily.    Historical Provider, MD  isosorbide mononitrate (IMDUR) 30 MG 24 hr tablet Take 30 mg by mouth daily. 10/16/15   Historical Provider, MD  oxyCODONE-acetaminophen (ROXICET) 5-325 MG tablet Take 1 tablet by mouth every 6 (six) hours as needed for severe pain. 11/29/15   Kimberly A Stegmayer, PA-C  predniSONE (DELTASONE) 20 MG tablet Take 3 tablets (60 mg total) by mouth daily. 07/07/16   Loney Hering, MD  Secukinumab (COSENTYX) 150 MG/ML SOSY Inject into  the skin.    Historical Provider, MD  traMADol (ULTRAM) 50 MG tablet Take 50 mg by mouth every 6 (six) hours as needed for moderate pain.    Historical Provider, MD    Allergies Codeine  No family history on file.  Social History Social History  Substance Use Topics  . Smoking status: Current Every Day Smoker    Packs/day: 0.50    Types: Cigarettes  . Smokeless tobacco: Never Used  . Alcohol use No    Review of Systems Constitutional: No fever/chills Eyes: No visual changes. ENT: No sore throat. Cardiovascular: Chest tightness Respiratory:  shortness of breath. Gastrointestinal: No abdominal pain.  No nausea, no vomiting.  No diarrhea.  No constipation. Genitourinary: Negative for dysuria. Musculoskeletal: Negative for back pain. Skin: Negative for rash. Neurological: Lightheadedness  10-point ROS otherwise negative.  ____________________________________________   PHYSICAL EXAM:  VITAL SIGNS: ED Triage Vitals  Enc Vitals Group     BP 07/06/16 2313 (!) 169/78     Pulse Rate 07/06/16 2313 89     Resp 07/06/16 2313 (!) 26     Temp 07/06/16 2313 98 F (36.7 C)     Temp src --      SpO2 07/06/16 2313 94 %     Weight 07/06/16 2313 148 lb (67.1 kg)     Height 07/06/16 2313 5\' 3"  (1.6 m)     Head Circumference --      Peak Flow --      Pain Score 07/06/16 2311 5     Pain Loc --      Pain Edu? --      Excl. in [place]? --     Constitutional: Alert and oriented. Well appearing and in Moderate respiratory distress. Eyes: Conjunctivae are normal. PERRL. EOMI. Head: Atraumatic. Nose: No congestion/rhinnorhea. Mouth/Throat: Mucous membranes are moist.  Oropharynx non-erythematous. Cardiovascular: Normal rate, regular rhythm. Grossly normal heart sounds.  Good peripheral circulation. Respiratory: Increased respiratory effort.  No retractions. Expiratory wheezes in all lung fields and speaking in 2-5 word sentences. Gastrointestinal: Soft and nontender. No distention.  Positive bowel sounds Musculoskeletal: No lower extremity tenderness nor edema.   Neurologic:  Normal speech and language.  Skin:  Skin is warm, dry and intact.  Psychiatric: Mood and affect are normal.   ____________________________________________   LABS (all labs ordered are listed, but only abnormal results are displayed)  Labs Reviewed  BASIC METABOLIC PANEL - Abnormal; Notable for the following:       Result Value   Glucose, Bld 122 (*)    BUN 44 (*)    Creatinine, Ser 1.36 (*)    GFR calc non Af Amer 38 (*)    GFR calc Af Amer 44 (*)    All other components within normal limits  CBC - Abnormal; Notable for the following:    WBC 18.8 (*)  RDW 17.8 (*)    All other components within normal limits  BLOOD GAS, VENOUS - Abnormal; Notable for the following:    Bicarbonate 31.7 (*)    Acid-Base Excess 5.8 (*)    All other components within normal limits  BRAIN NATRIURETIC PEPTIDE - Abnormal; Notable for the following:    B Natriuretic Peptide 129.0 (*)    All other components within normal limits  TROPONIN I   ____________________________________________  EKG  ED ECG REPORT I, Loney Hering, the attending physician, personally viewed and interpreted this ECG.   Date: 07/06/2016  EKG Time: 2318  Rate: 83  Rhythm: normal sinus rhythm  Axis: normal  Intervals:none  ST&T Change: none  ____________________________________________  RADIOLOGY  CXR ____________________________________________   PROCEDURES  Procedure(s) performed: None  Procedures  Critical Care performed: No  ____________________________________________   INITIAL IMPRESSION / ASSESSMENT AND PLAN / ED COURSE  Pertinent labs & imaging results that were available during my care of the patient were reviewed by me and considered in my medical decision making (see chart for details).  This is a 71 year old female who comes into the hospital today with some shortness of breath. On exam  the patient has some diffuse wheezes. She is also a smoker which makes me concerned for possible COPD. I will give the patient 3 duo nebs as well as Solu-Medrol. I will reassess the patient once I received the results of her blood work and she has received all of her medications.  Clinical Course as of Jul 07 132  Tue Jul 07, 2016  0026 Stable small right pleural effusion and linear opacities at right lung base probably representing scarring. No new focal consolidation. Aortic atherosclerosis. Borderline cardiomegaly.   DG Chest Portable 1 View [AW]  0129 I did reassess the patient once I received the results of her blood work and her wheezing is significantly improved. She is able to speak clearly and still has a mild cough. She reports that she feels better. I feel that the patient may have some COPD but this may be bronchitis given the abrupt onset. I will discharge the patient to home with some steroids as well as an inhaler and cough medicine. Patient should follow up with her primary care physician. She agrees the plan as stated.  [AW]    Clinical Course User Index [AW] Loney Hering, MD     ____________________________________________   FINAL CLINICAL IMPRESSION(S) / ED DIAGNOSES  Final diagnoses:  Bronchitis  Shortness of breath      NEW MEDICATIONS STARTED DURING THIS VISIT:  New Prescriptions   ALBUTEROL (PROVENTIL HFA;VENTOLIN HFA) 108 (90 BASE) MCG/ACT INHALER    Inhale 2 puffs into the lungs every 6 (six) hours as needed for wheezing or shortness of breath.   AZITHROMYCIN (ZITHROMAX Z-PAK) 250 MG TABLET    Take 2 tablets (500 mg) on  Day 1,  followed by 1 tablet (250 mg) once daily on Days 2 through 5.   BENZONATATE (TESSALON PERLES) 100 MG CAPSULE    Take 1 capsule (100 mg total) by mouth every 6 (six) hours as needed for cough.   PREDNISONE (DELTASONE) 20 MG TABLET    Take 3 tablets (60 mg total) by mouth daily.     Note:  This document was prepared using  Dragon voice recognition software and may include unintentional dictation errors.    Loney Hering, MD 07/07/16 [phone]

## 2016-07-07 DIAGNOSIS — R0602 Shortness of breath: Secondary | ICD-10-CM | POA: Diagnosis not present

## 2016-07-07 LAB — BASIC METABOLIC PANEL
Anion gap: 10 (ref 5–15)
BUN: 44 mg/dL — ABNORMAL HIGH (ref 6–20)
CHLORIDE: 101 mmol/L (ref 101–111)
CO2: 28 mmol/L (ref 22–32)
CREATININE: 1.36 mg/dL — AB (ref 0.44–1.00)
Calcium: 9.1 mg/dL (ref 8.9–10.3)
GFR calc non Af Amer: 38 mL/min — ABNORMAL LOW (ref >60)
GFR, EST AFRICAN AMERICAN: 44 mL/min — AB (ref >60)
Glucose, Bld: 122 mg/dL — ABNORMAL HIGH (ref 65–99)
Potassium: 3.7 mmol/L (ref 3.5–5.1)
SODIUM: 139 mmol/L (ref 135–145)

## 2016-07-07 LAB — TROPONIN I

## 2016-07-07 LAB — BRAIN NATRIURETIC PEPTIDE: B Natriuretic Peptide: 129 pg/mL — ABNORMAL HIGH (ref 0.0–100.0)

## 2016-07-07 MED ORDER — BENZONATATE 100 MG PO CAPS: 100.0000 mg | ORAL_CAPSULE | Freq: Four times a day (QID) | ORAL | 0 refills | Status: DC | PRN

## 2016-07-07 MED ORDER — AZITHROMYCIN 250 MG PO TABS: ORAL_TABLET | ORAL | 0 refills | Status: AC

## 2016-07-07 MED ORDER — ALBUTEROL SULFATE HFA 108 (90 BASE) MCG/ACT IN AERS: 2.0000 | INHALATION_SPRAY | Freq: Four times a day (QID) | RESPIRATORY_TRACT | 0 refills | Status: DC | PRN

## 2016-07-07 MED ORDER — PREDNISONE 20 MG PO TABS: 60.0000 mg | ORAL_TABLET | Freq: Every day | ORAL | 0 refills | Status: DC

## 2016-07-07 NOTE — Discharge Instructions (Signed)
Please follow-up with your primary care physician. It appears that she have some bronchitis which are wheezing is improved significantly. If you're shortness of breath returns and you're unable to control it with her medication please return to the emergency department.

## 2016-07-09 DIAGNOSIS — J209 Acute bronchitis, unspecified: Secondary | ICD-10-CM | POA: Diagnosis not present

## 2016-07-30 DIAGNOSIS — H6982 Other specified disorders of Eustachian tube, left ear: Secondary | ICD-10-CM | POA: Diagnosis not present

## 2016-08-03 ENCOUNTER — Encounter: Payer: Self-pay | Admitting: Emergency Medicine

## 2016-08-03 ENCOUNTER — Emergency Department
Admission: EM | Admit: 2016-08-03 | Discharge: 2016-08-03 | Disposition: A | Discharge status: Home / Self Care | Payer: PPO | Attending: Student in an Organized Health Care Education/Training Program | Admitting: Student in an Organized Health Care Education/Training Program

## 2016-08-03 ENCOUNTER — Emergency Department: Payer: PPO

## 2016-08-03 ENCOUNTER — Encounter: Payer: Self-pay | Admitting: *Deleted

## 2016-08-03 DIAGNOSIS — N183 Chronic kidney disease, stage 3 (moderate): Secondary | ICD-10-CM | POA: Insufficient documentation

## 2016-08-03 DIAGNOSIS — Z79899 Other long term (current) drug therapy: Secondary | ICD-10-CM | POA: Insufficient documentation

## 2016-08-03 DIAGNOSIS — F17210 Nicotine dependence, cigarettes, uncomplicated: Secondary | ICD-10-CM | POA: Insufficient documentation

## 2016-08-03 DIAGNOSIS — I1310 Hypertensive heart and chronic kidney disease without heart failure, with stage 1 through stage 4 chronic kidney disease, or unspecified chronic kidney disease: Secondary | ICD-10-CM | POA: Insufficient documentation

## 2016-08-03 DIAGNOSIS — I252 Old myocardial infarction: Secondary | ICD-10-CM | POA: Insufficient documentation

## 2016-08-03 DIAGNOSIS — R51 Headache: Secondary | ICD-10-CM

## 2016-08-03 DIAGNOSIS — I10 Essential (primary) hypertension: Secondary | ICD-10-CM

## 2016-08-03 DIAGNOSIS — R519 Headache, unspecified: Secondary | ICD-10-CM

## 2016-08-03 DIAGNOSIS — I2510 Atherosclerotic heart disease of native coronary artery without angina pectoris: Secondary | ICD-10-CM | POA: Insufficient documentation

## 2016-08-03 LAB — BASIC METABOLIC PANEL
Anion gap: 11 (ref 5–15)
BUN: 21 mg/dL — AB (ref 6–20)
CHLORIDE: 101 mmol/L (ref 101–111)
CO2: 27 mmol/L (ref 22–32)
CREATININE: 1.26 mg/dL — AB (ref 0.44–1.00)
Calcium: 9.6 mg/dL (ref 8.9–10.3)
GFR calc Af Amer: 48 mL/min — ABNORMAL LOW (ref >60)
GFR calc non Af Amer: 42 mL/min — ABNORMAL LOW (ref >60)
Glucose, Bld: 97 mg/dL (ref 65–99)
Potassium: 4.1 mmol/L (ref 3.5–5.1)
SODIUM: 139 mmol/L (ref 135–145)

## 2016-08-03 MED ORDER — AMLODIPINE BESYLATE 5 MG PO TABS
10.0000 mg | ORAL_TABLET | Freq: Once | ORAL | Status: AC
  Administered 2016-08-03: 10 mg via ORAL
  Filled 2016-08-03: qty 2

## 2016-08-03 MED ORDER — HYDRALAZINE HCL 25 MG PO TABS
25.0000 mg | ORAL_TABLET | Freq: Once | ORAL | Status: AC
  Administered 2016-08-03: 25 mg via ORAL
  Filled 2016-08-03: qty 1

## 2016-08-03 MED ORDER — ACETAMINOPHEN 500 MG PO TABS
1000.0000 mg | ORAL_TABLET | Freq: Once | ORAL | Status: AC
  Administered 2016-08-03: 1000 mg via ORAL
  Filled 2016-08-03: qty 2

## 2016-08-03 MED ORDER — DIPHENHYDRAMINE HCL 25 MG PO CAPS
25.0000 mg | ORAL_CAPSULE | Freq: Once | ORAL | Status: AC
Start: 2016-08-03 — End: 2016-08-03
  Administered 2016-08-03: 25 mg via ORAL
  Filled 2016-08-03: qty 1

## 2016-08-03 NOTE — Discharge Instructions (Signed)
[facility] Margaretha Sheffield, M.D. Roena Malady, M.D. Malon Kindle, M.D. Carloyn Manner, M.D.  Diet:   After surgery, the patient should take only liquids and foods as tolerated.  The patient may then have a regular diet after the effects of anesthesia have worn off, usually about four to six hours after surgery.  Activities:   The patient should rest until the effects of anesthesia have worn off.  After this, there are no restrictions on the normal daily activities.  Medications:   You will be given antibiotic drops to be used in the ears postoperatively.  It is recommended to use 4 drops 2 times a day for 4 days, then the drops should be saved for possible future use.  The tubes should not cause any discomfort to the patient, but if there is any question, Tylenol should be given according to the instructions for the age of the patient.  Other medications should be continued normally.  Precautions:   Should there be recurrent drainage after the tubes are placed, the drops should be used for approximately 3-4 days.  If it does not clear, you should call the ENT office.  Earplugs:   Earplugs are only needed for those who are going to be submerged under water.  When taking a bath or shower and using a cup or showerhead to rinse hair, it is not necessary to wear earplugs.  These come in a variety of fashions, all of which can be obtained at our office.  However, if one is not able to come by the office, then silicone plugs can be found at most pharmacies.  It is not advised to stick anything in the ear that is not approved as an earplug.  Silly putty is not to be used as an earplug.  Swimming is allowed in patients after ear tubes are inserted, however, they must wear earplugs if they are going to be submerged under water.  For those children who are going to be swimming a lot, it is  recommended to use a fitted ear mold, which can be made by our audiologist.  If discharge is noticed from the ears, this most likely represents an ear infection.  We would recommend getting your eardrops and using them as indicated above.  If it does not clear, then you should call the ENT office.  For follow up, the patient should return to the ENT office three weeks postoperatively and then every six months as required by the doctor.   General Anesthesia, Adult, Care After These instructions provide you with information about caring for yourself after your procedure. Your health care provider may also give you more specific instructions. Your treatment has been planned according to current medical practices, but problems sometimes occur. Call your health care provider if you have any problems or questions after your procedure. What can I expect after the procedure? After the procedure, it is common to have:  Vomiting.  A sore throat.  Mental slowness. It is common to feel:  Nauseous.  Cold or shivery.  Sleepy.  Tired.  Sore or achy, even in parts of your body where you did not have surgery. Follow these instructions at home: For at least 24 hours after the procedure:   Do not:  Participate in activities where you could fall or become injured.  Drive.  Use heavy machinery.  Drink alcohol.  Take sleeping pills or medicines  that cause drowsiness.  Make important decisions or sign legal documents.  Take care of children on your own.  Rest. Eating and drinking   If you vomit, drink water, juice, or soup when you can drink without vomiting.  Drink enough fluid to keep your urine clear or pale yellow.  Make sure you have little or no nausea before eating solid foods.  Follow the diet recommended by your health care provider. General instructions   Have a responsible adult stay with you until you are awake and alert.  Return to your normal activities as told by your  health care provider. Ask your health care provider what activities are safe for you.  Take over-the-counter and prescription medicines only as told by your health care provider.  If you smoke, do not smoke without supervision.  Keep all follow-up visits as told by your health care provider. This is important. Contact a health care provider if:  You continue to have nausea or vomiting at home, and medicines are not helpful.  You cannot drink fluids or start eating again.  You cannot urinate after 8-12 hours.  You develop a skin rash.  You have fever.  You have increasing redness at the site of your procedure. Get help right away if:  You have difficulty breathing.  You have chest pain.  You have unexpected bleeding.  You feel that you are having a life-threatening or urgent problem. This information is not intended to replace advice given to you by your health care provider. Make sure you discuss any questions you have with your health care provider. Document Released: 06/29/2000 Document Revised: 08/26/2015 Document Reviewed: 03/07/2015 Elsevier Interactive Patient Education  2017 [employer].

## 2016-08-03 NOTE — ED Provider Notes (Signed)
[facility] Emergency Department Provider Note    First MD Initiated Contact with Patient 08/03/16 1915     (approximate)  I have reviewed the triage vital signs and the nursing notes.   HISTORY  Chief Complaint Headache and Hypertension    HPI Lisa Wilkerson is a 71 y.o. female presents with concern over elevated blood pressure as well as a bandlike headache that has been bothering her throughout the day. Denies any numbness or tingling. No visual disturbance. Denies any chest pain or shortness of breath.  She was recently started on losartan and her amlodipine was discontinued. States that since recently made this medication change she's been noting that her blood pressure has been steadily increasing.  She tried to call her primary care physician to get an appointment but she was directed to the ER by their staff.   Past Medical History:  Diagnosis Date  . Arthritis   . Basal cell carcinoma of skin   . Chronic kidney disease    Stage 3 Chronic Kidney Disease  . Coronary artery disease   . Hyperlipidemia   . Hypertension   . Myocardial infarction ([place]) 07/2009  . Peripheral vascular disease ([place])   . Psoriasis   . Sleep apnea    no CPAP  . Squamous cell skin cancer   . TIA (transient ischemic attack) 2014   no deficits  . Vertigo    no issues for several years  . Wears dentures    full upper and lower   No family history on file. Past Surgical History:  Procedure Laterality Date  . CARDIAC CATHETERIZATION  2011  . ENDARTERECTOMY Left 11/28/2015   Procedure: ENDARTERECTOMY CAROTID;  Surgeon: Algernon Huxley, MD;  Location: [facility] ORS;  Service: Vascular;  Laterality: Left;  . EYE SURGERY Bilateral    Cataract Extraction with IOL  . UVULOPALATOPHARYNGOPLASTY  2000   Patient Active Problem List   Diagnosis Date Noted  . Hyperlipidemia 04/03/2016  . Essential hypertension, benign 04/03/2016  . Carotid stenosis 11/28/2015      Prior to  Admission medications   Medication Sig Start Date End Date Taking? Authorizing Provider  albuterol (PROVENTIL HFA;VENTOLIN HFA) 108 (90 Base) MCG/ACT inhaler Inhale 2 puffs into the lungs every 6 (six) hours as needed for wheezing or shortness of breath. 07/07/16   Loney Hering, MD  aspirin 81 MG tablet Take 81 mg by mouth daily.    Historical Provider, MD  atorvastatin (LIPITOR) 40 MG tablet Take 40 mg by mouth daily. 10/16/15   Historical Provider, MD  clobetasol ointment (TEMOVATE) 9.32 % Apply 1 application topically 2 (two) times daily as needed.     Historical Provider, MD  clopidogrel (PLAVIX) 75 MG tablet Take 75 mg by mouth daily. 10/16/15   Historical Provider, MD  clotrimazole-betamethasone (LOTRISONE) cream Apply 1 application topically 2 (two) times daily as needed.     Historical Provider, MD  fluticasone (FLONASE) 50 MCG/ACT nasal spray instill 2 sprays into each nostril once daily if needed for RHINITIS. 10/16/15   Historical Provider, MD  hydrALAZINE (APRESOLINE) 25 MG tablet Take 25 mg by mouth 2 (two) times daily.    Historical Provider, MD  isosorbide mononitrate (IMDUR) 30 MG 24 hr tablet Take 30 mg by mouth daily. 10/16/15   Historical Provider, MD  losartan (COZAAR) 50 MG tablet Take 50 mg by mouth daily.    Historical Provider, MD  Secukinumab (COSENTYX) 150 MG/ML SOSY Inject into the skin.  Historical Provider, MD  traMADol (ULTRAM) 50 MG tablet Take 50 mg by mouth every 6 (six) hours as needed for moderate pain.    Historical Provider, MD    Allergies Codeine    Social History Social History  Substance Use Topics  . Smoking status: Current Every Day Smoker    Packs/day: 0.50    Years: 40.00    Types: Cigarettes  . Smokeless tobacco: Never Used     Comment: down to 4 cigs/day  . Alcohol use No    Review of Systems Patient denies headaches, rhinorrhea, blurry vision, numbness, shortness of breath, chest pain, edema, cough, abdominal pain, nausea, vomiting,  diarrhea, dysuria, fevers, rashes or hallucinations unless otherwise stated above in HPI. ____________________________________________   PHYSICAL EXAM:  VITAL SIGNS: Vitals:   08/03/16 1926 08/03/16 2059  BP: (!) 226/110 (!) 145/64  Pulse: 70 61  Resp: 16 16  Temp:      Constitutional: Alert and oriented. Pleasant and well appearing and in no acute distress. Eyes: Conjunctivae are normal. PERRL. EOMI. Head: Atraumatic. Nose: No congestion/rhinnorhea. Mouth/Throat: Mucous membranes are moist.  Oropharynx non-erythematous. Neck: No stridor. Painless ROM. No cervical spine tenderness to palpation Hematological/Lymphatic/Immunilogical: No cervical lymphadenopathy. Cardiovascular: Normal rate, regular rhythm. Grossly normal heart sounds.  Good peripheral circulation. Respiratory: Normal respiratory effort.  No retractions. Lungs CTAB. Gastrointestinal: Soft and nontender. No distention. No abdominal bruits. No CVA tenderness. Musculoskeletal: No lower extremity tenderness nor edema.  No joint effusions. Neurologic:  Normal speech and language. No gross focal neurologic deficits are appreciated. No gait instability. Skin:  Skin is warm, dry and intact. No rash noted. Psychiatric: Mood and affect are normal. Speech and behavior are normal.  ____________________________________________   LABS (all labs ordered are listed, but only abnormal results are displayed)  Results for orders placed or performed during the hospital encounter of 08/03/16 (from the past 24 hour(s))  Basic metabolic panel     Status: Abnormal   Collection Time: 08/03/16  7:37 PM  Result Value Ref Range   Sodium 139 135 - 145 mmol/L   Potassium 4.1 3.5 - 5.1 mmol/L   Chloride 101 101 - 111 mmol/L   CO2 27 22 - 32 mmol/L   Glucose, Bld 97 65 - 99 mg/dL   BUN 21 (H) 6 - 20 mg/dL   Creatinine, Ser 1.26 (H) 0.44 - 1.00 mg/dL   Calcium 9.6 8.9 - 10.3 mg/dL   GFR calc non Af Amer 42 (L) >60 mL/min   GFR calc Af  Amer 48 (L) >60 mL/min   Anion gap 11 5 - 15   ____________________________________________  EKG My review and personal interpretation at Time: 17:17   Indication: htn  Rate: 17:17  Rhythm: sinus Axis: normal Other: no st elevations or dpressions ____________________________________________  RADIOLOGY  I personally reviewed all radiographic images ordered to evaluate for the above acute complaints and reviewed radiology reports and findings.  These findings were personally discussed with the patient.  Please see medical record for radiology report.  ____________________________________________   PROCEDURES  Procedure(s) performed:  Procedures    Critical Care performed: no ____________________________________________   INITIAL IMPRESSION / ASSESSMENT AND PLAN / ED COURSE  Pertinent labs & imaging results that were available during my care of the patient were reviewed by me and considered in my medical decision making (see chart for details).  DDX: htn, htnive urgency, tension headache  LISVET RASHEED is a 72 y.o. who presents to the ED with with concern  for elevated BP. Patient is AF,VSS with HTN in ED. Exam as above. Given current presentation have considered the above differential. No report of missed antihypertensive doses or medical non-compliance but has had recent med changes. No report of illicit drug use that could elevate BP. Extensive evaluation of possible end organ damage pursued in ED. No evidence of acute renal dysfunction. Neuro exam with no focal deficits. EKG without evidence of ischemia. Not consistent with CHF, malignant htn, adrenergic crisis or hypertensive emergency.   Clinical Course as of Aug 03 2105  Mon Aug 03, 2016  2057 BP improved.  Symptoms resolved.  Do not feel further diagnostic testing clinically indicated and she is otherwise asymptomatic. Blood pressure improved simply by restarting her previous blood pressure medications. Discussed need  to follow-up with primary care physician for further titration and management of her blood pressure medications. Discussed signs and symptoms for which the patient should return immediately to the hospital.  Have discussed with the patient and available family all diagnostics and treatments performed thus far and all questions were answered to the best of my ability. The patient demonstrates understanding and agreement with plan.   [PR]    Clinical Course User Index [PR] Merlyn Lot, MD     ____________________________________________   FINAL CLINICAL IMPRESSION(S) / ED DIAGNOSES  Final diagnoses:  Hypertension, unspecified type  Acute nonintractable headache, unspecified headache type      NEW MEDICATIONS STARTED DURING THIS VISIT:  New Prescriptions   No medications on file     Note:  This document was prepared using Dragon voice recognition software and may include unintentional dictation errors.    Merlyn Lot, MD 08/03/16 2108

## 2016-08-03 NOTE — ED Triage Notes (Signed)
Pt presents with headache. Was told to come to ER by her pcp for high blood pressure. Pt denies any chest pain, no dizziness, no shortness of breath.

## 2016-08-12 DIAGNOSIS — N183 Chronic kidney disease, stage 3 (moderate): Secondary | ICD-10-CM | POA: Diagnosis not present

## 2016-08-12 DIAGNOSIS — I10 Essential (primary) hypertension: Secondary | ICD-10-CM | POA: Diagnosis not present

## 2016-08-19 ENCOUNTER — Ambulatory Visit
Admission: RE | Admit: 2016-08-19 | Discharge: 2016-08-19 | Disposition: A | Discharge status: Home / Self Care | Payer: PPO | Source: Ambulatory Visit | Attending: Otolaryngology | Admitting: Otolaryngology

## 2016-08-19 ENCOUNTER — Ambulatory Visit
Admission: RE | Disposition: A | Discharge status: Home / Self Care | Payer: Self-pay | Source: Ambulatory Visit | Attending: Otolaryngology

## 2016-08-19 ENCOUNTER — Ambulatory Visit: Payer: PPO | Admitting: Anesthesiology

## 2016-08-19 DIAGNOSIS — F17200 Nicotine dependence, unspecified, uncomplicated: Secondary | ICD-10-CM | POA: Insufficient documentation

## 2016-08-19 DIAGNOSIS — I739 Peripheral vascular disease, unspecified: Secondary | ICD-10-CM | POA: Insufficient documentation

## 2016-08-19 DIAGNOSIS — G4730 Sleep apnea, unspecified: Secondary | ICD-10-CM | POA: Insufficient documentation

## 2016-08-19 DIAGNOSIS — I252 Old myocardial infarction: Secondary | ICD-10-CM | POA: Insufficient documentation

## 2016-08-19 DIAGNOSIS — N289 Disorder of kidney and ureter, unspecified: Secondary | ICD-10-CM | POA: Diagnosis not present

## 2016-08-19 DIAGNOSIS — H6523 Chronic serous otitis media, bilateral: Secondary | ICD-10-CM | POA: Insufficient documentation

## 2016-08-19 DIAGNOSIS — I10 Essential (primary) hypertension: Secondary | ICD-10-CM | POA: Diagnosis not present

## 2016-08-19 DIAGNOSIS — Z8673 Personal history of transient ischemic attack (TIA), and cerebral infarction without residual deficits: Secondary | ICD-10-CM | POA: Insufficient documentation

## 2016-08-19 DIAGNOSIS — I2510 Atherosclerotic heart disease of native coronary artery without angina pectoris: Secondary | ICD-10-CM | POA: Diagnosis not present

## 2016-08-19 HISTORY — DX: Dizziness and giddiness: R42

## 2016-08-19 HISTORY — DX: Presence of dental prosthetic device (complete) (partial): Z97.2

## 2016-08-19 HISTORY — PX: MYRINGOTOMY WITH TUBE PLACEMENT: SHX5663

## 2016-08-19 SURGERY — MYRINGOTOMY WITH TUBE PLACEMENT
Anesthesia: General | Site: Ear | Laterality: Bilateral | Wound class: Dirty or Infected

## 2016-08-19 MED ORDER — MIDAZOLAM HCL 5 MG/5ML IJ SOLN
INTRAMUSCULAR | Status: DC | PRN
  Administered 2016-08-19: 1 mg via INTRAVENOUS

## 2016-08-19 MED ORDER — OFLOXACIN 0.3 % OP SOLN
OPHTHALMIC | Status: DC | PRN
  Administered 2016-08-19: 4 [drp] via OTIC

## 2016-08-19 MED ORDER — EPHEDRINE SULFATE 50 MG/ML IJ SOLN
INTRAMUSCULAR | Status: DC | PRN
Start: 2016-08-19 — End: 2016-08-19
  Administered 2016-08-19: 10 mg via INTRAVENOUS

## 2016-08-19 MED ORDER — PROPOFOL 10 MG/ML IV BOLUS
INTRAVENOUS | Status: DC | PRN
  Administered 2016-08-19: 70 mg via INTRAVENOUS

## 2016-08-19 MED ORDER — ONDANSETRON HCL 4 MG PO TABS: 4.0000 mg | ORAL_TABLET | Freq: Three times a day (TID) | ORAL | 0 refills | Status: DC | PRN

## 2016-08-19 MED ORDER — LIDOCAINE HCL (CARDIAC) 20 MG/ML IV SOLN
INTRAVENOUS | Status: DC | PRN
  Administered 2016-08-19: 50 mg via INTRAVENOUS

## 2016-08-19 MED ORDER — ONDANSETRON HCL 4 MG/2ML IJ SOLN
INTRAMUSCULAR | Status: DC | PRN
  Administered 2016-08-19: 4 mg via INTRAVENOUS

## 2016-08-19 MED ORDER — LACTATED RINGERS IV SOLN
10.0000 mL/h | INTRAVENOUS | Status: DC
  Administered 2016-08-19: 10 mL/h via INTRAVENOUS

## 2016-08-19 MED ORDER — LACTATED RINGERS IV SOLN: INTRAVENOUS | Status: DC

## 2016-08-19 SURGICAL SUPPLY — 11 items
BLADE MYR LANCE NRW W/HDL (BLADE) ×2 IMPLANT
CANISTER SUCT 1200ML W/VALVE (MISCELLANEOUS) ×2 IMPLANT
COTTONBALL LRG STERILE PKG (GAUZE/BANDAGES/DRESSINGS) ×2 IMPLANT
GLOVE BIO SURGEON STRL SZ7.5 (GLOVE) ×4 IMPLANT
STRAP BODY AND KNEE 60X3 (MISCELLANEOUS) ×2 IMPLANT
TOWEL OR 17X26 4PK STRL BLUE (TOWEL DISPOSABLE) ×2 IMPLANT
TUBE EAR ARMSTRONG HC 1.14X3.5 (OTOLOGIC RELATED) ×4 IMPLANT
TUBE EAR T 1.27X4.5 GO LF (OTOLOGIC RELATED) IMPLANT
TUBE EAR T 1.27X5.3 BFLY (OTOLOGIC RELATED) IMPLANT
TUBING CONN 6MMX3.1M (TUBING) ×1
TUBING SUCTION CONN 0.25 STRL (TUBING) ×1 IMPLANT

## 2016-08-19 NOTE — Transfer of Care (Signed)
Immediate Anesthesia Transfer of Care Note  Patient: Lisa Wilkerson  Procedure(s) Performed: Procedure(s) with comments: MYRINGOTOMY WITH TUBE PLACEMENT (Bilateral) - sleep apnea  Patient Location: PACU  Anesthesia Type: General  Level of Consciousness: awake, alert  and patient cooperative  Airway and Oxygen Therapy: Patient Spontanous Breathing and Patient connected to supplemental oxygen  Post-op Assessment: Post-op Vital signs reviewed, Patient's Cardiovascular Status Stable, Respiratory Function Stable, Patent Airway and No signs of Nausea or vomiting  Post-op Vital Signs: Reviewed and stable  Complications: No apparent anesthesia complications

## 2016-08-19 NOTE — H&P (Signed)
..  History and Physical paper copy reviewed and updated date of procedure and will be scanned into system.  Patient seen and examined.  

## 2016-08-19 NOTE — Anesthesia Procedure Notes (Signed)
Procedure Name: MAC Performed by: Demarea Lorey Pre-anesthesia Checklist: Patient identified, Patient being monitored, Emergency Drugs available, Timeout performed and Suction available Patient Re-evaluated:Patient Re-evaluated prior to inductionOxygen Delivery Method: Circle system utilized Preoxygenation: Pre-oxygenation with 100% oxygen Intubation Type: Combination inhalational/ intravenous induction Ventilation: Mask ventilation without difficulty Dental Injury: Teeth and Oropharynx as per pre-operative assessment        

## 2016-08-19 NOTE — Anesthesia Preprocedure Evaluation (Signed)
Anesthesia Evaluation  Patient identified by MRN, date of birth, ID band Patient awake    Reviewed: Allergy & Precautions, H&P , NPO status , Patient's Chart, lab work & pertinent test results  Airway Mallampati: III  TM Distance: >3 FB Neck ROM: full    Dental  (+) Upper Dentures, Lower Dentures   Pulmonary sleep apnea , Current Smoker,    Pulmonary exam normal        Cardiovascular hypertension, + CAD, + Past MI and + Peripheral Vascular Disease  Normal cardiovascular exam     Neuro/Psych TIA   GI/Hepatic   Endo/Other    Renal/GU Renal disease     Musculoskeletal   Abdominal   Peds  Hematology   Anesthesia Other Findings   Reproductive/Obstetrics                             Anesthesia Physical Anesthesia Plan  ASA: III  Anesthesia Plan: General   Post-op Pain Management:    Induction:   Airway Management Planned: Mask  Additional Equipment:   Intra-op Plan:   Post-operative Plan:   Informed Consent: I have reviewed the patients History and Physical, chart, labs and discussed the procedure including the risks, benefits and alternatives for the proposed anesthesia with the patient or authorized representative who has indicated his/her understanding and acceptance.     Plan Discussed with:   Anesthesia Plan Comments:         Anesthesia Quick Evaluation

## 2016-08-19 NOTE — Anesthesia Postprocedure Evaluation (Signed)
Anesthesia Post Note  Patient: Lisa Wilkerson  Procedure(s) Performed: Procedure(s) (LRB): MYRINGOTOMY WITH TUBE PLACEMENT (Bilateral)  Patient location during evaluation: PACU Anesthesia Type: General Level of consciousness: awake and alert and oriented Pain management: satisfactory to patient Vital Signs Assessment: post-procedure vital signs reviewed and stable Respiratory status: spontaneous breathing, nonlabored ventilation and respiratory function stable Cardiovascular status: blood pressure returned to baseline and stable Postop Assessment: Adequate PO intake and No signs of nausea or vomiting Anesthetic complications: no    Raliegh Ip

## 2016-08-19 NOTE — Op Note (Signed)
..  08/19/2016  9:45 AM    Lisa Wilkerson  [id number]   Pre-Op Dx:  Chronic serous otitis media  Post-op Dx: same  Proc:Bilateral myringotomy with tubes  Surg: [name]  Anes:  General by mask  EBL:  None  Comp:  None  Findings:  Bilateral serous otitis media right greater than left.  Procedure: With the patient in a comfortable supine position, general mask anesthesia was administered.  At an appropriate level, microscope and speculum were used to examine and clean the RIGHT ear canal.  The findings were as described above.  An anterior inferior radial myringotomy incision was sharply executed.  Middle ear contents were suctioned clear with a size 5 otologic suction.  A PE tube was placed without difficulty using a Rosen pick and Animal nutritionist.  Ciprodex otic solution was instilled into the external canal, and insufflated into the middle ear.  A cotton ball was placed at the external meatus. Hemostasis was observed.  This side was completed.  After completing the RIGHT side, the LEFT side was done in identical fashion.    Following this  The patient was returned to anesthesia, awakened, and transferred to recovery in stable condition.  Dispo:  PACU to home  Plan: Routine drop use and water precautions.  Recheck my office three weeks.   Senta Kantor 9:45 AM 08/19/2016

## 2016-08-20 ENCOUNTER — Encounter: Payer: Self-pay | Admitting: Otolaryngology

## 2016-09-09 DIAGNOSIS — H6983 Other specified disorders of Eustachian tube, bilateral: Secondary | ICD-10-CM | POA: Diagnosis not present

## 2016-09-14 DIAGNOSIS — L400 Psoriasis vulgaris: Secondary | ICD-10-CM | POA: Diagnosis not present

## 2016-10-22 DIAGNOSIS — I10 Essential (primary) hypertension: Secondary | ICD-10-CM | POA: Diagnosis not present

## 2016-10-22 DIAGNOSIS — F17200 Nicotine dependence, unspecified, uncomplicated: Secondary | ICD-10-CM | POA: Diagnosis not present

## 2016-10-22 DIAGNOSIS — N183 Chronic kidney disease, stage 3 (moderate): Secondary | ICD-10-CM | POA: Diagnosis not present

## 2016-10-22 DIAGNOSIS — E782 Mixed hyperlipidemia: Secondary | ICD-10-CM | POA: Diagnosis not present

## 2016-10-22 DIAGNOSIS — I6523 Occlusion and stenosis of bilateral carotid arteries: Secondary | ICD-10-CM | POA: Diagnosis not present

## 2016-10-22 DIAGNOSIS — I2510 Atherosclerotic heart disease of native coronary artery without angina pectoris: Secondary | ICD-10-CM | POA: Diagnosis not present

## 2016-11-03 DIAGNOSIS — Z0000 Encounter for general adult medical examination without abnormal findings: Secondary | ICD-10-CM | POA: Diagnosis not present

## 2016-11-03 DIAGNOSIS — E782 Mixed hyperlipidemia: Secondary | ICD-10-CM | POA: Diagnosis not present

## 2016-11-03 DIAGNOSIS — F17200 Nicotine dependence, unspecified, uncomplicated: Secondary | ICD-10-CM | POA: Diagnosis not present

## 2016-11-03 DIAGNOSIS — R7303 Prediabetes: Secondary | ICD-10-CM | POA: Diagnosis not present

## 2016-11-03 DIAGNOSIS — I10 Essential (primary) hypertension: Secondary | ICD-10-CM | POA: Diagnosis not present

## 2016-11-03 DIAGNOSIS — N183 Chronic kidney disease, stage 3 (moderate): Secondary | ICD-10-CM | POA: Diagnosis not present

## 2016-12-11 DIAGNOSIS — Z1231 Encounter for screening mammogram for malignant neoplasm of breast: Secondary | ICD-10-CM | POA: Diagnosis not present

## 2017-01-11 DIAGNOSIS — K1121 Acute sialoadenitis: Secondary | ICD-10-CM | POA: Diagnosis not present

## 2017-01-19 DIAGNOSIS — K1121 Acute sialoadenitis: Secondary | ICD-10-CM | POA: Diagnosis not present

## 2017-01-19 DIAGNOSIS — H6983 Other specified disorders of Eustachian tube, bilateral: Secondary | ICD-10-CM | POA: Diagnosis not present

## 2017-01-29 DIAGNOSIS — I10 Essential (primary) hypertension: Secondary | ICD-10-CM | POA: Diagnosis not present

## 2017-01-29 DIAGNOSIS — E782 Mixed hyperlipidemia: Secondary | ICD-10-CM | POA: Diagnosis not present

## 2017-01-29 DIAGNOSIS — I2510 Atherosclerotic heart disease of native coronary artery without angina pectoris: Secondary | ICD-10-CM | POA: Diagnosis not present

## 2017-01-29 DIAGNOSIS — I6523 Occlusion and stenosis of bilateral carotid arteries: Secondary | ICD-10-CM | POA: Diagnosis not present

## 2017-03-01 DIAGNOSIS — J209 Acute bronchitis, unspecified: Secondary | ICD-10-CM | POA: Diagnosis not present

## 2017-04-02 ENCOUNTER — Ambulatory Visit (INDEPENDENT_AMBULATORY_CARE_PROVIDER_SITE_OTHER): Payer: PPO

## 2017-04-02 ENCOUNTER — Encounter (INDEPENDENT_AMBULATORY_CARE_PROVIDER_SITE_OTHER): Payer: Self-pay | Admitting: Vascular Surgery

## 2017-04-02 ENCOUNTER — Ambulatory Visit (INDEPENDENT_AMBULATORY_CARE_PROVIDER_SITE_OTHER): Payer: PPO | Admitting: Vascular Surgery

## 2017-04-02 VITALS — BP 174/104 | HR 59 | Resp 16 | Ht 63.5 in | Wt 162.0 lb

## 2017-04-02 DIAGNOSIS — I10 Essential (primary) hypertension: Secondary | ICD-10-CM | POA: Diagnosis not present

## 2017-04-02 DIAGNOSIS — I6523 Occlusion and stenosis of bilateral carotid arteries: Secondary | ICD-10-CM | POA: Diagnosis not present

## 2017-04-02 DIAGNOSIS — E785 Hyperlipidemia, unspecified: Secondary | ICD-10-CM | POA: Diagnosis not present

## 2017-04-02 NOTE — Progress Notes (Signed)
Subjective:    Patient ID: Lisa Wilkerson, female    DOB: Nov 29, 1945, 71 y.o.   MRN: [id number] Chief Complaint  Patient presents with  . Carotid    1 year carotid   Patient presents for a yearly non-invasive study follow up for carotid stenosis. The stenosis has been followed by surveillance duplexes. The patient underwent a bilateral carotid duplex scan which showed no change from the previous exam on 04-03-16. Duplex is stable at 1-39% ICA stenosis bilaterally.  Patent left carotid artery endarterectomy site. The patient denies experiencing Amaurosis Fugax, TIA like symptoms or focal motor deficits.   Review of Systems  Constitutional: Negative.   HENT: Negative.   Eyes: Negative.   Respiratory: Negative.   Cardiovascular: Negative.   Gastrointestinal: Negative.   Endocrine: Negative.   Genitourinary: Negative.   Musculoskeletal: Negative.   Skin: Negative.   Allergic/Immunologic: Negative.   Neurological: Negative.   Hematological: Negative.   Psychiatric/Behavioral: Negative.       Objective:   Physical Exam  Constitutional: She is oriented to person, place, and time. She appears well-developed and well-nourished. No distress.  HENT:  Head: Normocephalic and atraumatic.  Eyes: Conjunctivae are normal. Pupils are equal, round, and reactive to light.  Neck: Normal range of motion.  No carotid bruits noted on exam  Cardiovascular: Normal rate, regular rhythm, normal heart sounds and intact distal pulses.  Pulses:      Radial pulses are 2+ on the right side, and 2+ on the left side.  Pulmonary/Chest: Effort normal and breath sounds normal.  Musculoskeletal: Normal range of motion. She exhibits no edema.  Neurological: She is alert and oriented to person, place, and time.  Skin: Skin is warm and dry. She is not diaphoretic.  Psychiatric: She has a normal mood and affect. Her behavior is normal. Judgment and thought content normal.  Vitals reviewed.  BP (!) 174/104 (BP  Location: Right Arm, Patient Position: Sitting)   Pulse (!) 59   Resp 16   Ht 5' 3.5" (1.613 m)   Wt 162 lb (73.5 kg)   BMI 28.25 kg/m   Past Medical History:  Diagnosis Date  . Arthritis   . Basal cell carcinoma of skin   . Chronic kidney disease    Stage 3 Chronic Kidney Disease  . Coronary artery disease   . Hyperlipidemia   . Hypertension   . Myocardial infarction ([place]) 07/2009  . Peripheral vascular disease ([place])   . Psoriasis   . Sleep apnea    no CPAP  . Squamous cell skin cancer   . TIA (transient ischemic attack) 2014   no deficits  . Vertigo    no issues for several years  . Wears dentures    full upper and lower   Social History   Socioeconomic History  . Marital status: Widowed    Spouse name: Not on file  . Number of children: Not on file  . Years of education: Not on file  . Highest education level: Not on file  Social Needs  . Financial resource strain: Not on file  . Food insecurity - worry: Not on file  . Food insecurity - inability: Not on file  . Transportation needs - medical: Not on file  . Transportation needs - non-medical: Not on file  Occupational History  . Not on file  Tobacco Use  . Smoking status: Current Every Day Smoker    Packs/day: 0.50    Years: 40.00    Pack  years: 20.00    Types: Cigarettes  . Smokeless tobacco: Never Used  . Tobacco comment: down to 4 cigs/day  Substance and Sexual Activity  . Alcohol use: No  . Drug use: No  . Sexual activity: Not on file  Other Topics Concern  . Not on file  Social History Narrative  . Not on file   Past Surgical History:  Procedure Laterality Date  . CARDIAC CATHETERIZATION  2011  . ENDARTERECTOMY Left 11/28/2015   Procedure: ENDARTERECTOMY CAROTID;  Surgeon: Algernon Huxley, MD;  Location: [facility];  Service: Vascular;  Laterality: Left;  . EYE SURGERY Bilateral    Cataract Extraction with IOL  . MYRINGOTOMY WITH TUBE PLACEMENT Bilateral 08/19/2016   Procedure: MYRINGOTOMY WITH  TUBE PLACEMENT;  Surgeon: Carloyn Manner, MD;  Location: [place];  Service: ENT;  Laterality: Bilateral;  sleep apnea  . UVULOPALATOPHARYNGOPLASTY  2000   History reviewed. No pertinent family history.  Allergies  Allergen Reactions  . Codeine Other (See Comments)    She states it paralyzes her and she can't move.      Assessment & Plan:  Patient presents for a yearly non-invasive study follow up for carotid stenosis. The stenosis has been followed by surveillance duplexes. The patient underwent a bilateral carotid duplex scan which showed no change from the previous exam on 04-03-16. Duplex is stable at 1-39% ICA stenosis bilaterally.  Patent left carotid artery endarterectomy site. The patient denies experiencing Amaurosis Fugax, TIA like symptoms or focal motor deficits.  1. Hyperlipidemia, unspecified hyperlipidemia type - Stable Encouraged good control as its slows the progression of atherosclerotic disease  2. Essential hypertension, benign - Stable Encouraged good control as its slows the progression of atherosclerotic disease  3. Bilateral carotid artery stenosis - Stable Studies reviewed with patient. Patient asymptomatic with stable duplex.  No intervention at this time. Patient to return in one year for surveillance carotid duplex. I have discussed with the patient at length the risk factors for and pathogenesis of atherosclerotic disease and encouraged a healthy diet, regular exercise regimen and blood pressure / glucose control.  Patient was instructed to contact our office in the interim with problems such as arm / leg weakness or numbness, speech / swallowing difficulty or temporary monocular blindness. The patient expresses their understanding.  - VAS US CAROTID; Future  Current Outpatient Medications on File Prior to Visit  Medication Sig Dispense Refill  . albuterol (PROVENTIL HFA;VENTOLIN HFA) 108 (90 Base) MCG/ACT inhaler Inhale 2 puffs into the  lungs every 6 (six) hours as needed for wheezing or shortness of breath. 1 Inhaler 0  . amLODipine (NORVASC) 10 MG tablet Take 10 mg by mouth daily.    Marland Kitchen aspirin 81 MG tablet Take 81 mg by mouth daily.    Marland Kitchen atorvastatin (LIPITOR) 40 MG tablet Take 40 mg by mouth daily.  0  . chlorpheniramine-HYDROcodone (TUSSIONEX) 10-8 MG/5ML SUER take 5 milliliters by mouth at bedtime if needed for cough  0  . clobetasol ointment (TEMOVATE) 0.53 % Apply 1 application topically 2 (two) times daily as needed.     . clopidogrel (PLAVIX) 75 MG tablet Take 75 mg by mouth daily.  0  . clotrimazole-betamethasone (LOTRISONE) cream Apply 1 application topically 2 (two) times daily as needed.     . fluticasone (FLONASE) 50 MCG/ACT nasal spray instill 2 sprays into each nostril once daily if needed for RHINITIS.  0  . hydrALAZINE (APRESOLINE) 25 MG tablet Take 25 mg by mouth 2 (  two) times daily.    . hydrocortisone 2.5 % ointment Apply topically.    . isosorbide mononitrate (IMDUR) 30 MG 24 hr tablet Take 30 mg by mouth daily.  0  . losartan (COZAAR) 50 MG tablet Take 50 mg by mouth daily.    . ondansetron (ZOFRAN) 4 MG tablet Take 1 tablet (4 mg total) by mouth every 8 (eight) hours as needed for nausea or vomiting. 20 tablet 0  . Secukinumab (COSENTYX) 150 MG/ML SOSY Inject into the skin.    Marland Kitchen traMADol (ULTRAM) 50 MG tablet Take 50 mg by mouth every 6 (six) hours as needed for moderate pain.     No current facility-administered medications on file prior to visit.    There are no Patient Instructions on file for this visit. No Follow-up on file.  Ghali Morissette A Jaala Bohle, PA-C

## 2017-04-28 DIAGNOSIS — E782 Mixed hyperlipidemia: Secondary | ICD-10-CM | POA: Diagnosis not present

## 2017-04-28 DIAGNOSIS — R7303 Prediabetes: Secondary | ICD-10-CM | POA: Diagnosis not present

## 2017-05-05 DIAGNOSIS — I10 Essential (primary) hypertension: Secondary | ICD-10-CM | POA: Diagnosis not present

## 2017-05-05 DIAGNOSIS — N183 Chronic kidney disease, stage 3 (moderate): Secondary | ICD-10-CM | POA: Diagnosis not present

## 2017-05-05 DIAGNOSIS — E782 Mixed hyperlipidemia: Secondary | ICD-10-CM | POA: Diagnosis not present

## 2017-05-05 DIAGNOSIS — F17200 Nicotine dependence, unspecified, uncomplicated: Secondary | ICD-10-CM | POA: Diagnosis not present

## 2017-05-05 DIAGNOSIS — R7303 Prediabetes: Secondary | ICD-10-CM | POA: Diagnosis not present

## 2017-05-05 DIAGNOSIS — G8929 Other chronic pain: Secondary | ICD-10-CM | POA: Diagnosis not present

## 2017-05-05 DIAGNOSIS — M545 Low back pain: Secondary | ICD-10-CM | POA: Diagnosis not present

## 2017-05-14 DIAGNOSIS — L700 Acne vulgaris: Secondary | ICD-10-CM | POA: Diagnosis not present

## 2017-05-14 DIAGNOSIS — L400 Psoriasis vulgaris: Secondary | ICD-10-CM | POA: Diagnosis not present

## 2017-05-14 DIAGNOSIS — L820 Inflamed seborrheic keratosis: Secondary | ICD-10-CM | POA: Diagnosis not present

## 2017-05-14 DIAGNOSIS — Z79899 Other long term (current) drug therapy: Secondary | ICD-10-CM | POA: Diagnosis not present

## 2017-07-12 DIAGNOSIS — I2510 Atherosclerotic heart disease of native coronary artery without angina pectoris: Secondary | ICD-10-CM | POA: Diagnosis not present

## 2017-07-12 DIAGNOSIS — E782 Mixed hyperlipidemia: Secondary | ICD-10-CM | POA: Diagnosis not present

## 2017-07-12 DIAGNOSIS — I10 Essential (primary) hypertension: Secondary | ICD-10-CM | POA: Diagnosis not present

## 2017-07-12 DIAGNOSIS — I6523 Occlusion and stenosis of bilateral carotid arteries: Secondary | ICD-10-CM | POA: Diagnosis not present

## 2017-07-13 DIAGNOSIS — L578 Other skin changes due to chronic exposure to nonionizing radiation: Secondary | ICD-10-CM | POA: Diagnosis not present

## 2017-07-13 DIAGNOSIS — C44629 Squamous cell carcinoma of skin of left upper limb, including shoulder: Secondary | ICD-10-CM | POA: Diagnosis not present

## 2017-07-13 DIAGNOSIS — C4492 Squamous cell carcinoma of skin, unspecified: Secondary | ICD-10-CM

## 2017-07-13 DIAGNOSIS — L400 Psoriasis vulgaris: Secondary | ICD-10-CM | POA: Diagnosis not present

## 2017-07-13 DIAGNOSIS — L820 Inflamed seborrheic keratosis: Secondary | ICD-10-CM | POA: Diagnosis not present

## 2017-07-13 DIAGNOSIS — D0462 Carcinoma in situ of skin of left upper limb, including shoulder: Secondary | ICD-10-CM | POA: Diagnosis not present

## 2017-07-13 HISTORY — DX: Squamous cell carcinoma of skin, unspecified: C44.92

## 2017-07-15 DIAGNOSIS — H6983 Other specified disorders of Eustachian tube, bilateral: Secondary | ICD-10-CM | POA: Diagnosis not present

## 2017-08-02 ENCOUNTER — Other Ambulatory Visit: Payer: Self-pay

## 2017-08-02 NOTE — Discharge Instructions (Signed)
[facility] DISCHARGE INSTRUCTIONS FOR MYRINGOTOMY AND TUBE INSERTION   [facility] Margaretha Sheffield, M.D. Roena Malady, M.D. Malon Kindle, M.D. Carloyn Manner, M.D.  Diet:   After surgery, the patient should take only liquids and foods as tolerated.  The patient may then have a regular diet after the effects of anesthesia have worn off, usually about four to six hours after surgery.  Activities:   The patient should rest until the effects of anesthesia have worn off.  After this, there are no restrictions on the normal daily activities.  Medications:   You will be given antibiotic drops to be used in the ears postoperatively.  It is recommended to use 4 drops 2 times a day for 4 days, then the drops should be saved for possible future use.  The tubes should not cause any discomfort to the patient, but if there is any question, Tylenol should be given according to the instructions for the age of the patient.  Other medications should be continued normally.  Precautions:   Should there be recurrent drainage after the tubes are placed, the drops should be used for approximately 3-4 days.  If it does not clear, you should call the ENT office.  Earplugs:   Earplugs are only needed for those who are going to be submerged under water.  When taking a bath or shower and using a cup or showerhead to rinse hair, it is not necessary to wear earplugs.  These come in a variety of fashions, all of which can be obtained at our office.  However, if one is not able to come by the office, then silicone plugs can be found at most pharmacies.  It is not advised to stick anything in the ear that is not approved as an earplug.  Silly putty is not to be used as an earplug.  Swimming is allowed in patients after ear tubes are inserted, however, they must wear earplugs if they are going to be submerged under water.  For those children who are going to be swimming a lot, it is  recommended to use a fitted ear mold, which can be made by our audiologist.  If discharge is noticed from the ears, this most likely represents an ear infection.  We would recommend getting your eardrops and using them as indicated above.  If it does not clear, then you should call the ENT office.  For follow up, the patient should return to the ENT office three weeks postoperatively and then every six months as required by the doctor.   General Anesthesia, Adult, Care After These instructions provide you with information about caring for yourself after your procedure. Your health care provider may also give you more specific instructions. Your treatment has been planned according to current medical practices, but problems sometimes occur. Call your health care provider if you have any problems or questions after your procedure. What can I expect after the procedure? After the procedure, it is common to have:  Vomiting.  A sore throat.  Mental slowness.  It is common to feel:  Nauseous.  Cold or shivery.  Sleepy.  Tired.  Sore or achy, even in parts of your body where you did not have surgery.  Follow these instructions at home: For at least 24 hours after the procedure:  Do not: ? Participate in activities where you could fall or become injured. ? Drive. ? Use heavy machinery. ? Drink alcohol. ? Take sleeping pills or  medicines that cause drowsiness. ? Make important decisions or sign legal documents. ? Take care of children on your own.  Rest. Eating and drinking  If you vomit, drink water, juice, or soup when you can drink without vomiting.  Drink enough fluid to keep your urine clear or pale yellow.  Make sure you have little or no nausea before eating solid foods.  Follow the diet recommended by your health care provider. General instructions  Have a responsible adult stay with you until you are awake and alert.  Return to your normal activities as told by  your health care provider. Ask your health care provider what activities are safe for you.  Take over-the-counter and prescription medicines only as told by your health care provider.  If you smoke, do not smoke without supervision.  Keep all follow-up visits as told by your health care provider. This is important. Contact a health care provider if:  You continue to have nausea or vomiting at home, and medicines are not helpful.  You cannot drink fluids or start eating again.  You cannot urinate after 8-12 hours.  You develop a skin rash.  You have fever.  You have increasing redness at the site of your procedure. Get help right away if:  You have difficulty breathing.  You have chest pain.  You have unexpected bleeding.  You feel that you are having a life-threatening or urgent problem. This information is not intended to replace advice given to you by your health care provider. Make sure you discuss any questions you have with your health care provider. Document Released: 06/29/2000 Document Revised: 08/26/2015 Document Reviewed: 03/07/2015 Elsevier Interactive Patient Education  [employer].

## 2017-08-04 ENCOUNTER — Ambulatory Visit
Admission: RE | Admit: 2017-08-04 | Discharge: 2017-08-04 | Disposition: A | Discharge status: Home / Self Care | Payer: PPO | Source: Ambulatory Visit | Attending: Otolaryngology | Admitting: Otolaryngology

## 2017-08-04 ENCOUNTER — Encounter
Admission: RE | Disposition: A | Discharge status: Home / Self Care | Payer: Self-pay | Source: Ambulatory Visit | Attending: Otolaryngology

## 2017-08-04 ENCOUNTER — Ambulatory Visit: Payer: PPO | Admitting: Anesthesiology

## 2017-08-04 DIAGNOSIS — I2510 Atherosclerotic heart disease of native coronary artery without angina pectoris: Secondary | ICD-10-CM | POA: Diagnosis not present

## 2017-08-04 DIAGNOSIS — N189 Chronic kidney disease, unspecified: Secondary | ICD-10-CM | POA: Insufficient documentation

## 2017-08-04 DIAGNOSIS — E785 Hyperlipidemia, unspecified: Secondary | ICD-10-CM | POA: Insufficient documentation

## 2017-08-04 DIAGNOSIS — Z8673 Personal history of transient ischemic attack (TIA), and cerebral infarction without residual deficits: Secondary | ICD-10-CM | POA: Diagnosis not present

## 2017-08-04 DIAGNOSIS — G4733 Obstructive sleep apnea (adult) (pediatric): Secondary | ICD-10-CM | POA: Diagnosis not present

## 2017-08-04 DIAGNOSIS — H6591 Unspecified nonsuppurative otitis media, right ear: Secondary | ICD-10-CM | POA: Diagnosis not present

## 2017-08-04 DIAGNOSIS — Z79899 Other long term (current) drug therapy: Secondary | ICD-10-CM | POA: Insufficient documentation

## 2017-08-04 DIAGNOSIS — H6980 Other specified disorders of Eustachian tube, unspecified ear: Secondary | ICD-10-CM | POA: Diagnosis not present

## 2017-08-04 DIAGNOSIS — I129 Hypertensive chronic kidney disease with stage 1 through stage 4 chronic kidney disease, or unspecified chronic kidney disease: Secondary | ICD-10-CM | POA: Insufficient documentation

## 2017-08-04 DIAGNOSIS — H6983 Other specified disorders of Eustachian tube, bilateral: Secondary | ICD-10-CM | POA: Diagnosis not present

## 2017-08-04 DIAGNOSIS — H6521 Chronic serous otitis media, right ear: Secondary | ICD-10-CM | POA: Diagnosis not present

## 2017-08-04 DIAGNOSIS — L409 Psoriasis, unspecified: Secondary | ICD-10-CM | POA: Insufficient documentation

## 2017-08-04 DIAGNOSIS — F17210 Nicotine dependence, cigarettes, uncomplicated: Secondary | ICD-10-CM | POA: Insufficient documentation

## 2017-08-04 DIAGNOSIS — I252 Old myocardial infarction: Secondary | ICD-10-CM | POA: Insufficient documentation

## 2017-08-04 DIAGNOSIS — H73892 Other specified disorders of tympanic membrane, left ear: Secondary | ICD-10-CM | POA: Diagnosis not present

## 2017-08-04 DIAGNOSIS — Z7902 Long term (current) use of antithrombotics/antiplatelets: Secondary | ICD-10-CM | POA: Diagnosis not present

## 2017-08-04 HISTORY — PX: MYRINGOTOMY WITH TUBE PLACEMENT: SHX5663

## 2017-08-04 SURGERY — MYRINGOTOMY WITH TUBE PLACEMENT
Anesthesia: Monitor Anesthesia Care | Site: Ear | Laterality: Bilateral | Wound class: "Clean Contaminated "

## 2017-08-04 MED ORDER — MIDAZOLAM HCL 5 MG/5ML IJ SOLN
INTRAMUSCULAR | Status: DC | PRN
  Administered 2017-08-04: 2 mg via INTRAVENOUS

## 2017-08-04 MED ORDER — PROPOFOL 10 MG/ML IV BOLUS
INTRAVENOUS | Status: DC | PRN
  Administered 2017-08-04: 50 mg via INTRAVENOUS

## 2017-08-04 MED ORDER — ONDANSETRON HCL 4 MG/2ML IJ SOLN
INTRAMUSCULAR | Status: DC | PRN
  Administered 2017-08-04: 4 mg via INTRAVENOUS

## 2017-08-04 MED ORDER — LACTATED RINGERS IV SOLN
10.0000 mL/h | INTRAVENOUS | Status: DC
  Administered 2017-08-04: 10 mL/h via INTRAVENOUS

## 2017-08-04 MED ORDER — GLYCOPYRROLATE 0.2 MG/ML IJ SOLN
INTRAMUSCULAR | Status: DC | PRN
  Administered 2017-08-04: 0.1 mg via INTRAVENOUS

## 2017-08-04 MED ORDER — CIPROFLOXACIN-DEXAMETHASONE 0.3-0.1 % OT SUSP
OTIC | Status: DC | PRN
  Administered 2017-08-04: 1 [drp] via OTIC

## 2017-08-04 SURGICAL SUPPLY — 9 items
BLADE MYR LANCE NRW W/HDL (BLADE) ×3 IMPLANT
CANISTER SUCT 1200ML W/VALVE (MISCELLANEOUS) ×3 IMPLANT
COTTONBALL LRG STERILE PKG (GAUZE/BANDAGES/DRESSINGS) ×3 IMPLANT
GLOVE BIO SURGEON STRL SZ7.5 (GLOVE) ×3 IMPLANT
STRAP BODY AND KNEE 60X3 (MISCELLANEOUS) ×3 IMPLANT
TOWEL OR 17X26 4PK STRL BLUE (TOWEL DISPOSABLE) ×3 IMPLANT
TUBE EAR T 1.27X5.3 BFLY (OTOLOGIC RELATED) IMPLANT
TUBING CONN 6MMX3.1M (TUBING) ×2
TUBING SUCTION CONN 0.25 STRL (TUBING) ×1 IMPLANT

## 2017-08-04 NOTE — H&P (Signed)
..  History and Physical paper copy reviewed and updated date of procedure and will be scanned into system.  Patient seen and examined.  

## 2017-08-04 NOTE — Anesthesia Procedure Notes (Signed)
Procedure Name: General with mask airway Performed by: Delrico Minehart, CRNA Pre-anesthesia Checklist: Patient identified, Patient being monitored, Emergency Drugs available, Timeout performed and Suction available Patient Re-evaluated:Patient Re-evaluated prior to induction Oxygen Delivery Method: Circle system utilized Preoxygenation: Pre-oxygenation with 100% oxygen Induction Type: Combination inhalational/ intravenous induction Ventilation: Mask ventilation without difficulty Dental Injury: Teeth and Oropharynx as per pre-operative assessment        

## 2017-08-04 NOTE — Anesthesia Postprocedure Evaluation (Signed)
Anesthesia Post Note  Patient: Lisa Wilkerson  Procedure(s) Performed: MYRINGOTOMY WITH TUBE PLACEMENT           BUTTERFLY TUBES (Bilateral Ear)  Patient location during evaluation: PACU Anesthesia Type: MAC Level of consciousness: awake and alert Pain management: pain level controlled Vital Signs Assessment: post-procedure vital signs reviewed and stable Respiratory status: spontaneous breathing Cardiovascular status: blood pressure returned to baseline Postop Assessment: no headache Anesthetic complications: no    Jaci Standard, III,  Cleotilde Spadaccini D

## 2017-08-04 NOTE — Anesthesia Preprocedure Evaluation (Signed)
Anesthesia Evaluation  Patient identified by MRN, date of birth, ID band Patient awake    Reviewed: Allergy & Precautions, H&P , NPO status , Patient's Chart, lab work & pertinent test results  Airway Mallampati: II  TM Distance: >3 FB     Dental   Pulmonary sleep apnea , Current Smoker,    Pulmonary exam normal        Cardiovascular hypertension, + CAD and + Past MI  Normal cardiovascular exam     Neuro/Psych TIA   GI/Hepatic negative GI ROS, Neg liver ROS,   Endo/Other    Renal/GU      Musculoskeletal   Abdominal   Peds  Hematology negative hematology ROS (+)   Anesthesia Other Findings   Reproductive/Obstetrics                             Anesthesia Physical Anesthesia Plan  ASA: III  Anesthesia Plan: MAC   Post-op Pain Management:    Induction:   PONV Risk Score and Plan:   Airway Management Planned:   Additional Equipment:   Intra-op Plan:   Post-operative Plan:   Informed Consent: I have reviewed the patients History and Physical, chart, labs and discussed the procedure including the risks, benefits and alternatives for the proposed anesthesia with the patient or authorized representative who has indicated his/her understanding and acceptance.     Plan Discussed with:   Anesthesia Plan Comments:         Anesthesia Quick Evaluation

## 2017-08-04 NOTE — Transfer of Care (Signed)
Immediate Anesthesia Transfer of Care Note  Patient: Lisa Wilkerson  Procedure(s) Performed: MYRINGOTOMY WITH TUBE PLACEMENT           BUTTERFLY TUBES (Bilateral Ear)  Patient Location: PACU  Anesthesia Type: MAC  Level of Consciousness: awake, alert  and patient cooperative  Airway and Oxygen Therapy: Patient Spontanous Breathing and Patient connected to supplemental oxygen  Post-op Assessment: Post-op Vital signs reviewed, Patient's Cardiovascular Status Stable, Respiratory Function Stable, Patent Airway and No signs of Nausea or vomiting  Post-op Vital Signs: Reviewed and stable  Complications: No apparent anesthesia complications

## 2017-08-04 NOTE — Op Note (Signed)
..  08/04/2017  7:43 AM    Lisa Wilkerson  [id number]   Pre-Op Dx:  EUSTACHIAN TUBE DYSFUNCTION  Post-op Dx: EUSTACHIAN TUBE DYSFUNCTION  Proc:Bilateral myringotomy with Butterfly tubes  Surg: [name]  Anes:  General by mask  EBL:  None  Comp:  None  Findings:  Right serous otitis media, left retraction, tubes placed posterior inferiorly bilaterally  Procedure: With the patient in a comfortable supine position, general mask anesthesia was administered.  At an appropriate level, microscope and speculum were used to examine and clean the RIGHT ear canal.  The findings were as described above.  An posterior inferior radial myringotomy incision was sharply executed.  Middle ear contents were suctioned clear with a size 5 otologic suction.  A PE tube was placed without difficulty using a Rosen pick and Animal nutritionist.  Ciprodex otic solution was instilled into the external canal, and insufflated into the middle ear.  A cotton ball was placed at the external meatus. Hemostasis was observed.  This side was completed.  After completing the RIGHT side, the LEFT side was done in identical fashion.    Following this  The patient was returned to anesthesia, awakened, and transferred to recovery in stable condition.  Dispo:  PACU to home  Plan: Routine drop use and water precautions.  Recheck my office in 6 months.Marland Kitchen   [name] 7:43 AM 08/04/2017

## 2017-08-06 ENCOUNTER — Encounter: Payer: Self-pay | Admitting: Otolaryngology

## 2017-08-17 DIAGNOSIS — L400 Psoriasis vulgaris: Secondary | ICD-10-CM | POA: Diagnosis not present

## 2017-08-17 DIAGNOSIS — L570 Actinic keratosis: Secondary | ICD-10-CM | POA: Diagnosis not present

## 2017-08-17 DIAGNOSIS — D485 Neoplasm of uncertain behavior of skin: Secondary | ICD-10-CM | POA: Diagnosis not present

## 2017-09-20 ENCOUNTER — Encounter: Payer: Self-pay | Admitting: Otolaryngology

## 2017-11-02 DIAGNOSIS — R7303 Prediabetes: Secondary | ICD-10-CM | POA: Diagnosis not present

## 2017-11-02 DIAGNOSIS — E782 Mixed hyperlipidemia: Secondary | ICD-10-CM | POA: Diagnosis not present

## 2017-11-02 DIAGNOSIS — I10 Essential (primary) hypertension: Secondary | ICD-10-CM | POA: Diagnosis not present

## 2017-11-02 DIAGNOSIS — N183 Chronic kidney disease, stage 3 (moderate): Secondary | ICD-10-CM | POA: Diagnosis not present

## 2017-11-02 DIAGNOSIS — F17200 Nicotine dependence, unspecified, uncomplicated: Secondary | ICD-10-CM | POA: Diagnosis not present

## 2017-11-09 DIAGNOSIS — Z0000 Encounter for general adult medical examination without abnormal findings: Secondary | ICD-10-CM | POA: Diagnosis not present

## 2017-11-09 DIAGNOSIS — Z23 Encounter for immunization: Secondary | ICD-10-CM | POA: Diagnosis not present

## 2017-11-09 DIAGNOSIS — R7303 Prediabetes: Secondary | ICD-10-CM | POA: Diagnosis not present

## 2017-11-09 DIAGNOSIS — N183 Chronic kidney disease, stage 3 (moderate): Secondary | ICD-10-CM | POA: Diagnosis not present

## 2017-11-09 DIAGNOSIS — F17200 Nicotine dependence, unspecified, uncomplicated: Secondary | ICD-10-CM | POA: Diagnosis not present

## 2017-11-09 DIAGNOSIS — I10 Essential (primary) hypertension: Secondary | ICD-10-CM | POA: Diagnosis not present

## 2017-11-09 DIAGNOSIS — E782 Mixed hyperlipidemia: Secondary | ICD-10-CM | POA: Diagnosis not present

## 2017-11-23 DIAGNOSIS — L400 Psoriasis vulgaris: Secondary | ICD-10-CM | POA: Diagnosis not present

## 2017-11-23 DIAGNOSIS — D485 Neoplasm of uncertain behavior of skin: Secondary | ICD-10-CM | POA: Diagnosis not present

## 2017-11-23 DIAGNOSIS — C44629 Squamous cell carcinoma of skin of left upper limb, including shoulder: Secondary | ICD-10-CM | POA: Diagnosis not present

## 2017-12-20 DIAGNOSIS — L578 Other skin changes due to chronic exposure to nonionizing radiation: Secondary | ICD-10-CM | POA: Diagnosis not present

## 2017-12-20 DIAGNOSIS — L570 Actinic keratosis: Secondary | ICD-10-CM | POA: Diagnosis not present

## 2017-12-20 DIAGNOSIS — C44629 Squamous cell carcinoma of skin of left upper limb, including shoulder: Secondary | ICD-10-CM | POA: Diagnosis not present

## 2018-01-17 DIAGNOSIS — I10 Essential (primary) hypertension: Secondary | ICD-10-CM | POA: Diagnosis not present

## 2018-01-17 DIAGNOSIS — F17200 Nicotine dependence, unspecified, uncomplicated: Secondary | ICD-10-CM | POA: Diagnosis not present

## 2018-01-17 DIAGNOSIS — I2510 Atherosclerotic heart disease of native coronary artery without angina pectoris: Secondary | ICD-10-CM | POA: Diagnosis not present

## 2018-01-17 DIAGNOSIS — E782 Mixed hyperlipidemia: Secondary | ICD-10-CM | POA: Diagnosis not present

## 2018-01-17 DIAGNOSIS — I6523 Occlusion and stenosis of bilateral carotid arteries: Secondary | ICD-10-CM | POA: Diagnosis not present

## 2018-01-17 DIAGNOSIS — Z8673 Personal history of transient ischemic attack (TIA), and cerebral infarction without residual deficits: Secondary | ICD-10-CM | POA: Diagnosis not present

## 2018-02-06 ENCOUNTER — Emergency Department: Payer: PPO

## 2018-02-06 ENCOUNTER — Other Ambulatory Visit: Payer: Self-pay

## 2018-02-06 ENCOUNTER — Inpatient Hospital Stay
Admission: EM | Admit: 2018-02-06 | Discharge: 2018-02-08 | DRG: 871 | Disposition: A | Discharge status: Home / Self Care | Payer: PPO | Attending: Internal Medicine | Admitting: Internal Medicine

## 2018-02-06 ENCOUNTER — Encounter: Payer: Self-pay | Admitting: Intensive Care

## 2018-02-06 DIAGNOSIS — A4101 Sepsis due to Methicillin susceptible Staphylococcus aureus: Principal | ICD-10-CM | POA: Diagnosis present

## 2018-02-06 DIAGNOSIS — Z85828 Personal history of other malignant neoplasm of skin: Secondary | ICD-10-CM | POA: Diagnosis not present

## 2018-02-06 DIAGNOSIS — N183 Chronic kidney disease, stage 3 (moderate): Secondary | ICD-10-CM | POA: Diagnosis present

## 2018-02-06 DIAGNOSIS — J441 Chronic obstructive pulmonary disease with (acute) exacerbation: Secondary | ICD-10-CM | POA: Diagnosis not present

## 2018-02-06 DIAGNOSIS — J440 Chronic obstructive pulmonary disease with acute lower respiratory infection: Secondary | ICD-10-CM | POA: Diagnosis not present

## 2018-02-06 DIAGNOSIS — Z7902 Long term (current) use of antithrombotics/antiplatelets: Secondary | ICD-10-CM

## 2018-02-06 DIAGNOSIS — J189 Pneumonia, unspecified organism: Secondary | ICD-10-CM | POA: Diagnosis not present

## 2018-02-06 DIAGNOSIS — R0602 Shortness of breath: Secondary | ICD-10-CM

## 2018-02-06 DIAGNOSIS — J181 Lobar pneumonia, unspecified organism: Secondary | ICD-10-CM | POA: Diagnosis present

## 2018-02-06 DIAGNOSIS — I2510 Atherosclerotic heart disease of native coronary artery without angina pectoris: Secondary | ICD-10-CM | POA: Diagnosis not present

## 2018-02-06 DIAGNOSIS — R079 Chest pain, unspecified: Secondary | ICD-10-CM | POA: Diagnosis not present

## 2018-02-06 DIAGNOSIS — Z720 Tobacco use: Secondary | ICD-10-CM | POA: Diagnosis not present

## 2018-02-06 DIAGNOSIS — I252 Old myocardial infarction: Secondary | ICD-10-CM

## 2018-02-06 DIAGNOSIS — R05 Cough: Secondary | ICD-10-CM | POA: Diagnosis not present

## 2018-02-06 DIAGNOSIS — Z885 Allergy status to narcotic agent status: Secondary | ICD-10-CM | POA: Diagnosis not present

## 2018-02-06 DIAGNOSIS — I129 Hypertensive chronic kidney disease with stage 1 through stage 4 chronic kidney disease, or unspecified chronic kidney disease: Secondary | ICD-10-CM | POA: Diagnosis present

## 2018-02-06 DIAGNOSIS — Z79899 Other long term (current) drug therapy: Secondary | ICD-10-CM | POA: Diagnosis not present

## 2018-02-06 DIAGNOSIS — J209 Acute bronchitis, unspecified: Secondary | ICD-10-CM | POA: Diagnosis present

## 2018-02-06 DIAGNOSIS — J9601 Acute respiratory failure with hypoxia: Secondary | ICD-10-CM | POA: Diagnosis present

## 2018-02-06 DIAGNOSIS — I739 Peripheral vascular disease, unspecified: Secondary | ICD-10-CM | POA: Diagnosis not present

## 2018-02-06 DIAGNOSIS — A419 Sepsis, unspecified organism: Secondary | ICD-10-CM

## 2018-02-06 DIAGNOSIS — Z8673 Personal history of transient ischemic attack (TIA), and cerebral infarction without residual deficits: Secondary | ICD-10-CM | POA: Diagnosis not present

## 2018-02-06 DIAGNOSIS — I358 Other nonrheumatic aortic valve disorders: Secondary | ICD-10-CM | POA: Diagnosis not present

## 2018-02-06 DIAGNOSIS — R0902 Hypoxemia: Secondary | ICD-10-CM | POA: Diagnosis not present

## 2018-02-06 DIAGNOSIS — Z7982 Long term (current) use of aspirin: Secondary | ICD-10-CM | POA: Diagnosis not present

## 2018-02-06 LAB — CBC WITH DIFFERENTIAL/PLATELET
ABS IMMATURE GRANULOCYTES: 0.08 10*3/uL — AB (ref 0.00–0.07)
Basophils Absolute: 0.1 10*3/uL (ref 0.0–0.1)
Basophils Relative: 1 %
Eosinophils Absolute: 0 10*3/uL (ref 0.0–0.5)
Eosinophils Relative: 0 %
HCT: 43.7 % (ref 36.0–46.0)
HEMOGLOBIN: 13.9 g/dL (ref 12.0–15.0)
IMMATURE GRANULOCYTES: 1 %
Lymphocytes Relative: 9 %
Lymphs Abs: 0.9 10*3/uL (ref 0.7–4.0)
MCH: 28.1 pg (ref 26.0–34.0)
MCHC: 31.8 g/dL (ref 30.0–36.0)
MCV: 88.3 fL (ref 80.0–100.0)
MONO ABS: 0.8 10*3/uL (ref 0.1–1.0)
MONOS PCT: 8 %
NEUTROS ABS: 8.5 10*3/uL — AB (ref 1.7–7.7)
NEUTROS PCT: 81 %
Platelets: 294 10*3/uL (ref 150–400)
RBC: 4.95 MIL/uL (ref 3.87–5.11)
RDW: 15.5 % (ref 11.5–15.5)
WBC: 10.4 10*3/uL (ref 4.0–10.5)
nRBC: 0 % (ref 0.0–0.2)

## 2018-02-06 LAB — URINALYSIS, COMPLETE (UACMP) WITH MICROSCOPIC
BACTERIA UA: NONE SEEN
Bilirubin Urine: NEGATIVE
GLUCOSE, UA: NEGATIVE mg/dL
Hgb urine dipstick: NEGATIVE
Ketones, ur: 5 mg/dL — AB
LEUKOCYTES UA: NEGATIVE
NITRITE: NEGATIVE
PH: 6 (ref 5.0–8.0)
Protein, ur: 100 mg/dL — AB
SPECIFIC GRAVITY, URINE: 1.014 (ref 1.005–1.030)

## 2018-02-06 LAB — BLOOD GAS, VENOUS
ACID-BASE DEFICIT: 0.6 mmol/L (ref 0.0–2.0)
BICARBONATE: 25.8 mmol/L (ref 20.0–28.0)
FIO2: 0.28
PH VEN: 7.33 (ref 7.250–7.430)
Patient temperature: 37
pCO2, Ven: 49 mmHg (ref 44.0–60.0)
pO2, Ven: <31 mmHg — CL (ref 32.0–45.0)

## 2018-02-06 LAB — COMPREHENSIVE METABOLIC PANEL
ALK PHOS: 124 U/L (ref 38–126)
ALT: 18 U/L (ref 0–44)
AST: 30 U/L (ref 15–41)
Albumin: 3.7 g/dL (ref 3.5–5.0)
Anion gap: 15 (ref 5–15)
BUN: 21 mg/dL (ref 8–23)
CALCIUM: 9.2 mg/dL (ref 8.9–10.3)
CO2: 24 mmol/L (ref 22–32)
Chloride: 96 mmol/L — ABNORMAL LOW (ref 98–111)
Creatinine, Ser: 1.63 mg/dL — ABNORMAL HIGH (ref 0.44–1.00)
GFR calc Af Amer: 35 mL/min — ABNORMAL LOW (ref >60)
GFR calc non Af Amer: 30 mL/min — ABNORMAL LOW (ref >60)
GLUCOSE: 113 mg/dL — AB (ref 70–99)
Potassium: 3.9 mmol/L (ref 3.5–5.1)
SODIUM: 135 mmol/L (ref 135–145)
Total Bilirubin: 0.6 mg/dL (ref 0.3–1.2)
Total Protein: 8.2 g/dL — ABNORMAL HIGH (ref 6.5–8.1)

## 2018-02-06 LAB — PROTIME-INR
INR: 0.95
PROTHROMBIN TIME: 12.6 s (ref 11.4–15.2)

## 2018-02-06 LAB — PROCALCITONIN: PROCALCITONIN: 0.18 ng/mL

## 2018-02-06 LAB — BRAIN NATRIURETIC PEPTIDE: B NATRIURETIC PEPTIDE 5: 99 pg/mL (ref 0.0–100.0)

## 2018-02-06 LAB — LACTIC ACID, PLASMA
Lactic Acid, Venous: 2.7 mmol/L (ref 0.5–1.9)
Lactic Acid, Venous: 2.3 mmol/L (ref 0.5–1.9)

## 2018-02-06 LAB — TROPONIN I

## 2018-02-06 MED ORDER — SODIUM CHLORIDE 0.9 % IV SOLN
500.0000 mg | INTRAVENOUS | Status: DC
  Administered 2018-02-06 – 2018-02-07 (×2): 500 mg via INTRAVENOUS
  Filled 2018-02-06 (×3): qty 500

## 2018-02-06 MED ORDER — SODIUM CHLORIDE 0.9 % IV BOLUS
1000.0000 mL | Freq: Once | INTRAVENOUS | Status: AC
  Administered 2018-02-06: 1000 mL via INTRAVENOUS

## 2018-02-06 MED ORDER — ALBUTEROL SULFATE (2.5 MG/3ML) 0.083% IN NEBU: 2.5000 mg | INHALATION_SOLUTION | RESPIRATORY_TRACT | Status: DC | PRN

## 2018-02-06 MED ORDER — AMLODIPINE BESYLATE 10 MG PO TABS
10.0000 mg | ORAL_TABLET | Freq: Every day | ORAL | Status: DC
  Administered 2018-02-07 – 2018-02-08 (×2): 10 mg via ORAL
  Filled 2018-02-06 (×2): qty 1

## 2018-02-06 MED ORDER — ONDANSETRON HCL 4 MG/2ML IJ SOLN: 4.0000 mg | Freq: Four times a day (QID) | INTRAMUSCULAR | Status: DC | PRN

## 2018-02-06 MED ORDER — ISOSORBIDE MONONITRATE ER 30 MG PO TB24
30.0000 mg | ORAL_TABLET | Freq: Every day | ORAL | Status: DC
  Administered 2018-02-07 – 2018-02-08 (×2): 30 mg via ORAL
  Filled 2018-02-06 (×2): qty 1

## 2018-02-06 MED ORDER — ASPIRIN EC 81 MG PO TBEC
81.0000 mg | DELAYED_RELEASE_TABLET | Freq: Every day | ORAL | Status: DC
  Administered 2018-02-07: 81 mg via ORAL
  Filled 2018-02-06 (×2): qty 1

## 2018-02-06 MED ORDER — LOSARTAN POTASSIUM 50 MG PO TABS
50.0000 mg | ORAL_TABLET | Freq: Every day | ORAL | Status: DC
  Filled 2018-02-06: qty 1

## 2018-02-06 MED ORDER — ACETAMINOPHEN 325 MG PO TABS
650.0000 mg | ORAL_TABLET | Freq: Once | ORAL | Status: AC | PRN
  Administered 2018-02-06: 650 mg via ORAL
  Filled 2018-02-06: qty 2

## 2018-02-06 MED ORDER — SODIUM CHLORIDE 0.9 % IV SOLN
2.0000 g | INTRAVENOUS | Status: DC
  Administered 2018-02-06 – 2018-02-07 (×2): 2 g via INTRAVENOUS
  Filled 2018-02-06: qty 2
  Filled 2018-02-06 (×2): qty 20

## 2018-02-06 MED ORDER — IPRATROPIUM-ALBUTEROL 0.5-2.5 (3) MG/3ML IN SOLN
3.0000 mL | Freq: Once | RESPIRATORY_TRACT | Status: AC
  Administered 2018-02-06: 3 mL via RESPIRATORY_TRACT
  Filled 2018-02-06: qty 3

## 2018-02-06 MED ORDER — ACETAMINOPHEN 325 MG PO TABS: 650.0000 mg | ORAL_TABLET | Freq: Four times a day (QID) | ORAL | Status: DC | PRN

## 2018-02-06 MED ORDER — PNEUMOCOCCAL VAC POLYVALENT 25 MCG/0.5ML IJ INJ
0.5000 mL | INJECTION | INTRAMUSCULAR | Status: DC
  Filled 2018-02-06: qty 0.5

## 2018-02-06 MED ORDER — ONDANSETRON HCL 4 MG PO TABS: 4.0000 mg | ORAL_TABLET | Freq: Four times a day (QID) | ORAL | Status: DC | PRN

## 2018-02-06 MED ORDER — CLOPIDOGREL BISULFATE 75 MG PO TABS
75.0000 mg | ORAL_TABLET | Freq: Every day | ORAL | Status: DC
  Administered 2018-02-07 – 2018-02-08 (×2): 75 mg via ORAL
  Filled 2018-02-06 (×2): qty 1

## 2018-02-06 MED ORDER — METHYLPREDNISOLONE SODIUM SUCC 125 MG IJ SOLR
60.0000 mg | Freq: Two times a day (BID) | INTRAMUSCULAR | Status: DC
  Administered 2018-02-07 – 2018-02-08 (×3): 60 mg via INTRAVENOUS
  Filled 2018-02-06 (×3): qty 2

## 2018-02-06 MED ORDER — METHYLPREDNISOLONE SODIUM SUCC 125 MG IJ SOLR
125.0000 mg | Freq: Once | INTRAMUSCULAR | Status: AC
  Administered 2018-02-06: 125 mg via INTRAVENOUS
  Filled 2018-02-06: qty 2

## 2018-02-06 MED ORDER — LACTATED RINGERS IV SOLN
INTRAVENOUS | Status: DC
  Administered 2018-02-06: 19:00:00 via INTRAVENOUS

## 2018-02-06 MED ORDER — ACETAMINOPHEN 650 MG RE SUPP: 650.0000 mg | Freq: Four times a day (QID) | RECTAL | Status: DC | PRN

## 2018-02-06 MED ORDER — TRAMADOL HCL 50 MG PO TABS: 50.0000 mg | ORAL_TABLET | Freq: Four times a day (QID) | ORAL | Status: DC | PRN

## 2018-02-06 MED ORDER — SODIUM CHLORIDE 0.9% FLUSH: 3.0000 mL | INTRAVENOUS | Status: DC | PRN

## 2018-02-06 MED ORDER — POLYETHYLENE GLYCOL 3350 17 G PO PACK: 17.0000 g | PACK | Freq: Every day | ORAL | Status: DC | PRN

## 2018-02-06 MED ORDER — ATORVASTATIN CALCIUM 20 MG PO TABS
40.0000 mg | ORAL_TABLET | Freq: Every day | ORAL | Status: DC
  Administered 2018-02-06 – 2018-02-07 (×2): 40 mg via ORAL
  Filled 2018-02-06 (×3): qty 2

## 2018-02-06 MED ORDER — INFLUENZA VAC SPLIT HIGH-DOSE 0.5 ML IM SUSY
0.5000 mL | PREFILLED_SYRINGE | INTRAMUSCULAR | Status: DC
  Filled 2018-02-06: qty 0.5

## 2018-02-06 MED ORDER — SODIUM CHLORIDE 0.9% FLUSH
3.0000 mL | Freq: Two times a day (BID) | INTRAVENOUS | Status: DC
  Administered 2018-02-06 – 2018-02-08 (×4): 3 mL via INTRAVENOUS

## 2018-02-06 MED ORDER — IPRATROPIUM-ALBUTEROL 0.5-2.5 (3) MG/3ML IN SOLN
3.0000 mL | Freq: Four times a day (QID) | RESPIRATORY_TRACT | Status: DC
  Administered 2018-02-06 – 2018-02-07 (×2): 3 mL via RESPIRATORY_TRACT
  Filled 2018-02-06 (×2): qty 3

## 2018-02-06 MED ORDER — HYDRALAZINE HCL 50 MG PO TABS
25.0000 mg | ORAL_TABLET | Freq: Two times a day (BID) | ORAL | Status: DC
  Filled 2018-02-06 (×2): qty 1

## 2018-02-06 MED ORDER — ACETAMINOPHEN 325 MG PO TABS
325.0000 mg | ORAL_TABLET | Freq: Once | ORAL | Status: AC
  Administered 2018-02-06: 325 mg via ORAL
  Filled 2018-02-06: qty 1

## 2018-02-06 MED ORDER — ENOXAPARIN SODIUM 30 MG/0.3ML ~~LOC~~ SOLN
30.0000 mg | SUBCUTANEOUS | Status: DC
  Administered 2018-02-06: 30 mg via SUBCUTANEOUS
  Filled 2018-02-06: qty 0.3

## 2018-02-06 NOTE — ED Notes (Signed)
Verbal order for NS bolus from Dr. Archie Balboa after lactic acid results.  Dr. Archie Balboa also made aware of lactic of 2.7

## 2018-02-06 NOTE — Progress Notes (Signed)
Advance care planning  Purpose of Encounter Pneumonia, code status discussion  Parties in Attendance Patient and daughter at bedside  Patients Decisional capacity Patient is alert and oriented.  Able to make decisions per  Discussed with patient regarding her Pneumonia, bronchitis and need for admission.  Does not have documented HCPOA. Encouraged to have paperwork finished this admission.  She is full code  Time spent - 17 minutes

## 2018-02-06 NOTE — ED Provider Notes (Addendum)
[facility] Emergency Department Provider Note  ____________________________________________  Time seen: Approximately 5:29 PM  I have reviewed the triage vital signs and the nursing notes.   HISTORY  Chief Complaint No chief complaint on file.    HPI Lisa Wilkerson is a 72 y.o. female with a history of COPD not on oxygen, ongoing tobacco abuse, CAD status post MI, HTN, HL, presenting with cough, fevers and shortness of breath.  The patient reports that yesterday she woke up with fevers to 101.0 and chills, as well as a cough.  She has had increasing shortness of breath and today went to urgent care was found to have O2 sats of 83% on room air.  Her O2 sats have normalized to 98% on 2 L nasal cannula.  She denies any nausea vomiting or diarrhea, chest pain, lower extremity swelling.  Past Medical History:  Diagnosis Date  . Arthritis   . Basal cell carcinoma of skin   . Chronic kidney disease    Stage 3 Chronic Kidney Disease  . Coronary artery disease   . Hyperlipidemia   . Hypertension   . Myocardial infarction ([place]) 07/2009  . Peripheral vascular disease ([place])   . Psoriasis   . Sleep apnea    no CPAP  . Squamous cell skin cancer   . TIA (transient ischemic attack) 2014   no deficits  . Vertigo    no issues for several years  . Wears dentures    full upper and lower    Patient Active Problem List   Diagnosis Date Noted  . Hyperlipidemia 04/03/2016  . Essential hypertension, benign 04/03/2016  . Carotid stenosis 11/28/2015    Past Surgical History:  Procedure Laterality Date  . CARDIAC CATHETERIZATION  2011  . ENDARTERECTOMY Left 11/28/2015   Procedure: ENDARTERECTOMY CAROTID;  Surgeon: Algernon Huxley, MD;  Location: [facility] ORS;  Service: Vascular;  Laterality: Left;  . EYE SURGERY Bilateral    Cataract Extraction with IOL  . MYRINGOTOMY WITH TUBE PLACEMENT Bilateral 08/19/2016   Procedure: MYRINGOTOMY WITH TUBE PLACEMENT;  Surgeon: Carloyn Manner, MD;  Location: [place];  Service: ENT;  Laterality: Bilateral;  sleep apnea  . MYRINGOTOMY WITH TUBE PLACEMENT Bilateral 08/04/2017   Procedure: MYRINGOTOMY WITH TUBE PLACEMENT           BUTTERFLY TUBES;  Surgeon: Carloyn Manner, MD;  Location: [place];  Service: ENT;  Laterality: Bilateral;  . UVULOPALATOPHARYNGOPLASTY  2000    Current Outpatient Rx  . Order #: [id number] Class: Print  . Order #: [id number] Class: Historical Med  . Order #: [id number] Class: Historical Med  . Order #: [id number] Class: Historical Med  . Order #: [id number] Class: Historical Med  . Order #: [id number] Class: Historical Med  . Order #: [id number] Class: Historical Med  . Order #: [id number] Class: Historical Med  . Order #: [id number] Class: Historical Med  . Order #: [id number] Class: Historical Med  . Order #: [id number] Class: Historical Med  . Order #: [id number] Class: Historical Med  . Order #: [id number] Class: Normal  . Order #: [id number] Class: Historical Med  . Order #: [id number] Class: Historical Med  . Order #: [id number] Class: Historical Med    Allergies Codeine  History reviewed. No pertinent family history.  Social History Social History   Tobacco Use  . Smoking status: Current Every Day Smoker    Packs/day: 0.50    Years: 40.00    Pack years: 20.00    Types: Cigarettes  . Smokeless tobacco: Never Used  . Tobacco comment: down  to 4 cigs/day  Substance Use Topics  . Alcohol use: No  . Drug use: No    Review of Systems Constitutional: Positive fever/chills.  No lightheadedness or syncope.  Positive general malaise. Eyes: No visual changes.  No eye discharge per ENT: No sore throat. No congestion or rhinorrhea.  No ear pain. Cardiovascular: Denies chest pain. Denies palpitations. Respiratory: Positive shortness of breath.  Positive cough. Gastrointestinal: No abdominal pain.  No nausea, no vomiting.  No diarrhea.  No constipation. Genitourinary: Negative for  dysuria. Musculoskeletal: Negative for back pain.  No lower extremity swelling.kin: Negative for rash. Neurological: Negative for headaches. No focal numbness, tingling or weakness.     ____________________________________________   PHYSICAL EXAM:  VITAL SIGNS: ED Triage Vitals  Enc Vitals Group     BP 02/06/18 1440 90/66     Pulse Rate 02/06/18 1440 86     Resp 02/06/18 1440 (!) 22     Temp 02/06/18 1440 (!) 101.3 F (38.5 C)     Temp Source 02/06/18 1440 Oral     SpO2 02/06/18 1440 90 %     Weight 02/06/18 1449 153 lb 12.8 oz (69.8 kg)     Height 02/06/18 1449 5\' 3"  (1.6 m)     Head Circumference --      Peak Flow --      Pain Score 02/06/18 1449 5     Pain Loc --      Pain Edu? --      Excl. in [place]? --     Constitutional: Alert and oriented. Answers questions appropriately.  Chronically ill-appearing. Eyes: Conjunctivae are normal.  EOMI. No scleral icterus.  No eye discharge. Head: Atraumatic. Nose: No congestion/rhinnorhea. Mouth/Throat: Mucous membranes are moist.  Neck: No stridor.  Supple.  No meningismus. Cardiovascular: Normal rate, regular rhythm. No murmurs, rubs or gallops.  Respiratory: She has a normal respiratory rate with a prolonged expiratory phase.  She has end expiratory wheezing without rales or rhonchi.  Her O2 sats are 98% on 2 L nasal cannula on my examination. Gastrointestinal: Soft, nontender and nondistended.  No guarding or rebound.  No peritoneal signs. Musculoskeletal: No LE edema. No ttp in the calves or palpable cords.  Negative Homan's sign. Neurologic:  A&Ox3.  Speech is clear.  Face and smile are symmetric.  EOMI.  Moves all extremities well. Skin:  Skin is warm, dry and intact. No rash noted. Psychiatric: Mood and affect are normal. Speech and behavior are normal.  Normal judgement  ____________________________________________   LABS (all labs ordered are listed, but only abnormal results are displayed)  Labs Reviewed   COMPREHENSIVE METABOLIC PANEL - Abnormal; Notable for the following components:      Result Value   Chloride 96 (*)    Glucose, Bld 113 (*)    Creatinine, Ser 1.63 (*)    Total Protein 8.2 (*)    GFR calc non Af Amer 30 (*)    GFR calc Af Amer 35 (*)    All other components within normal limits  LACTIC ACID, PLASMA - Abnormal; Notable for the following components:   Lactic Acid, Venous 2.7 (*)    All other components within normal limits  LACTIC ACID, PLASMA - Abnormal; Notable for the following components:   Lactic Acid, Venous 2.3 (*)    All other components within normal limits  CBC WITH DIFFERENTIAL/PLATELET - Abnormal; Notable for the following components:   Neutro Abs 8.5 (*)    Abs Immature Granulocytes 0.08 (*)  All other components within normal limits  CULTURE, BLOOD (ROUTINE X 2)  CULTURE, BLOOD (ROUTINE X 2)  PROTIME-INR  URINALYSIS, COMPLETE (UACMP) WITH MICROSCOPIC  TROPONIN I  BLOOD GAS, VENOUS  BRAIN NATRIURETIC PEPTIDE  INFLUENZA PANEL BY PCR (TYPE A & B)   ____________________________________________  EKG  ED ECG REPORT I, Anne-Caroline Mariea Clonts, the attending physician, personally viewed and interpreted this ECG.   Date: 02/06/2018  EKG Time: 245  Rate: 91  Rhythm: normal sinus rhythm  Axis: normal  Intervals:none  ST&T Change: No STEMI  ____________________________________________  RADIOLOGY  Dg Chest 2 View  Result Date: 02/06/2018 CLINICAL DATA:  Cough and joint pain. EXAM: CHEST - 2 VIEW COMPARISON:  Multiple chest x-rays since June 05, 2010 FINDINGS: Opacity in the lateral right lung base is similar since 2012, likely scarring. No pneumothorax. No nodules or masses. Blunting of the right costophrenic angle is stable, either a tiny effusion or pleural thickening. No other acute abnormalities. IMPRESSION: 1. Probable scarring or chronic atelectasis in the right lung. 2. Pleural thickening versus a small pleural effusion on the right.  Electronically Signed   By: Dorise Bullion III M.D   On: 02/06/2018 16:02    ____________________________________________   PROCEDURES  Procedure(s) performed: None  Procedures  Critical Care performed: Yes, see critical care note(s) ____________________________________________   INITIAL IMPRESSION / ASSESSMENT AND PLAN / ED COURSE  Pertinent labs & imaging results that were available during my care of the patient were reviewed by me and considered in my medical decision making (see chart for details).  72 y.o. female with a history of COPD and ongoing tobacco abuse, CAD status post MI, presenting with fever, cough and shortness of breath with new hypoxia and oxygen requirement.  At this time, the patient's oxygenation is controlled with 2 L nasal cannula and we will continue to monitor this.  A VBG is pending.  She did come up with a temperature of 101.3 and has received Tylenol; we will recheck this as well.  The patient's chest x-ray shows old scarring versus small pleural effusion on the right, but I am concerned about a radiographic lag for possible pneumonia.  She will receive empiric azithromycin and ceftriaxone for community-acquired pneumonia.  An influenza swab is pending although the patient states she had an episode of result at the clinic today.  I am unable to see this result when I review her chart.  Patient may also have a viral URI resulting in a COPD exacerbation.  Given her new oxygen requirement, we will plan to admit the patient to the hospital for continued evaluation and treatment.  ----------------------------------------- 5:39 PM on 02/06/2018 -----------------------------------------  I have reviewed the patient's chart which shows a negative influenza testing from clinic.  CRITICAL CARE Performed by: Eula Listen   Total critical care time: 35 minutes  Critical care time was exclusive of separately billable procedures and treating other  patients.  Critical care was necessary to treat or prevent imminent or life-threatening deterioration.  Critical care was time spent personally by me on the following activities: development of treatment plan with patient and/or surrogate as well as nursing, discussions with consultants, evaluation of patient's response to treatment, examination of patient, obtaining history from patient or surrogate, ordering and performing treatments and interventions, ordering and review of laboratory studies, ordering and review of radiographic studies, pulse oximetry and re-evaluation of patient's condition.   ____________________________________________  FINAL CLINICAL IMPRESSION(S) / ED DIAGNOSES  Final diagnoses:  Sepsis, due to unspecified  organism, unspecified whether acute organ dysfunction present ([place])  COPD exacerbation ([place])  Hypoxia  Community acquired pneumonia of right lower lobe of lung ([place])         NEW MEDICATIONS STARTED DURING THIS VISIT:  New Prescriptions   No medications on file      Eula Listen, MD 02/06/18 1737    Eula Listen, MD 02/06/18 1739

## 2018-02-06 NOTE — H&P (Addendum)
[facility] [place] NAME: Lisa Wilkerson    MR#:  [id number]  DATE OF BIRTH:  01/16/1946  DATE OF ADMISSION:  02/06/2018  PRIMARY CARE PHYSICIAN: Dion Body, MD   REQUESTING/REFERRING PHYSICIAN: Dr. Mariea Clonts  CHIEF COMPLAINT:  No chief complaint on file.  Fever, SOB HISTORY OF PRESENT ILLNESS:  Lisa Wilkerson  is a 72 y.o. female with a known history of hypertension, CKD 3, tobacco abuse presents to the emergency room from urgent care where she went for fever, shortness of breath and myalgias.  Patient was found to be hypoxic in the 80s on room air with temperature 101.3.  Chest x-ray clear.  Sepsis with lactic acid of 2.7.  Patient is being admitted to the hospitalist service. No sick contacts.  Influenza checked at urgent clinic is negative.  PAST MEDICAL HISTORY:   Past Medical History:  Diagnosis Date  . Arthritis   . Basal cell carcinoma of skin   . Chronic kidney disease    Stage 3 Chronic Kidney Disease  . Coronary artery disease   . Hyperlipidemia   . Hypertension   . Myocardial infarction ([place]) 07/2009  . Peripheral vascular disease ([place])   . Psoriasis   . Sleep apnea    no CPAP  . Squamous cell skin cancer   . TIA (transient ischemic attack) 2014   no deficits  . Vertigo    no issues for several years  . Wears dentures    full upper and lower    PAST SURGICAL HISTORY:   Past Surgical History:  Procedure Laterality Date  . CARDIAC CATHETERIZATION  2011  . ENDARTERECTOMY Left 11/28/2015   Procedure: ENDARTERECTOMY CAROTID;  Surgeon: Algernon Huxley, MD;  Location: [facility] ORS;  Service: Vascular;  Laterality: Left;  . EYE SURGERY Bilateral    Cataract Extraction with IOL  . MYRINGOTOMY WITH TUBE PLACEMENT Bilateral 08/19/2016   Procedure: MYRINGOTOMY WITH TUBE PLACEMENT;  Surgeon: Carloyn Manner, MD;  Location: [place];  Service: ENT;  Laterality: Bilateral;  sleep apnea  . MYRINGOTOMY WITH TUBE  PLACEMENT Bilateral 08/04/2017   Procedure: MYRINGOTOMY WITH TUBE PLACEMENT           BUTTERFLY TUBES;  Surgeon: Carloyn Manner, MD;  Location: [place];  Service: ENT;  Laterality: Bilateral;  . UVULOPALATOPHARYNGOPLASTY  2000    SOCIAL HISTORY:   Social History   Tobacco Use  . Smoking status: Current Every Day Smoker    Packs/day: 0.50    Years: 40.00    Pack years: 20.00    Types: Cigarettes  . Smokeless tobacco: Never Used  . Tobacco comment: down to 4 cigs/day  Substance Use Topics  . Alcohol use: No    FAMILY HISTORY:  History reviewed. No pertinent family history.  DRUG ALLERGIES:   Allergies  Allergen Reactions  . Codeine Other (See Comments)    She states it paralyzes her and she can't move.    REVIEW OF SYSTEMS:   Review of Systems  Constitutional: Positive for chills, fever and malaise/fatigue.  HENT: Negative for sore throat.   Eyes: Negative for blurred vision, double vision and pain.  Respiratory: Positive for cough, shortness of breath and wheezing. Negative for hemoptysis.   Cardiovascular: Negative for chest pain, palpitations, orthopnea and leg swelling.  Gastrointestinal: Negative for abdominal pain, constipation, diarrhea, heartburn, nausea and vomiting.  Genitourinary: Negative for dysuria and hematuria.  Musculoskeletal: Negative for back pain and joint pain.  Skin: Negative  for rash.  Neurological: Negative for sensory change, speech change, focal weakness and headaches.  Endo/Heme/Allergies: Does not bruise/bleed easily.  Psychiatric/Behavioral: Negative for depression. The patient is not nervous/anxious.     MEDICATIONS AT HOME:   Prior to Admission medications   Medication Sig Start Date End Date Taking? Authorizing Provider  albuterol (PROVENTIL HFA;VENTOLIN HFA) 108 (90 Base) MCG/ACT inhaler Inhale 2 puffs into the lungs every 6 (six) hours as needed for wheezing or shortness of breath. 07/07/16   Loney Hering, MD   amLODipine (NORVASC) 10 MG tablet Take 10 mg by mouth daily.    [provider]  aspirin 81 MG tablet Take 81 mg by mouth daily.    [provider]  atorvastatin (LIPITOR) 40 MG tablet Take 40 mg by mouth daily. 10/16/15   [provider]  clobetasol ointment (TEMOVATE) 1.91 % Apply 1 application topically 2 (two) times daily as needed.     [provider]  clopidogrel (PLAVIX) 75 MG tablet Take 75 mg by mouth daily. 10/16/15   [provider]  clotrimazole-betamethasone (LOTRISONE) cream Apply 1 application topically 2 (two) times daily as needed.     [provider]  fluticasone (FLONASE) 50 MCG/ACT nasal spray instill 2 sprays into each nostril once daily if needed for RHINITIS. 10/16/15   [provider]  hydrALAZINE (APRESOLINE) 25 MG tablet Take 25 mg by mouth 2 (two) times daily.    [provider]  hydrocortisone 2.5 % ointment Apply topically. 02/17/13   [provider]  isosorbide mononitrate (IMDUR) 30 MG 24 hr tablet Take 30 mg by mouth daily. 10/16/15   [provider]  losartan (COZAAR) 50 MG tablet Take 50 mg by mouth daily.    [provider]  ondansetron (ZOFRAN) 4 MG tablet Take 1 tablet (4 mg total) by mouth every 8 (eight) hours as needed for nausea or vomiting. Patient not taking: Reported on 08/02/2017 08/19/16   Carloyn Manner, MD  Secukinumab (COSENTYX) 150 MG/ML SOSY Inject into the skin.    [provider]  tildrakizumab-asmn (ILUMYA) 100 MG/ML subcutaneous injection Inject 100 mg into the skin every 4 (four) months.    [provider]  traMADol (ULTRAM) 50 MG tablet Take 50 mg by mouth every 6 (six) hours as needed for moderate pain.    [provider]     VITAL SIGNS:  Blood pressure 132/82, pulse 71, temperature (!) 101.3 F (38.5 C), temperature source Oral, resp. rate (!) 24, height 5\' 3"  (1.6 m), weight 69.8 kg, SpO2 99 %.  PHYSICAL  EXAMINATION:  Physical Exam  GENERAL:  72 y.o.-year-old patient lying in the bed with conversational dyspnea EYES: Pupils equal, round, reactive to light and accommodation. No scleral icterus. Extraocular muscles intact.  HEENT: Head atraumatic, normocephalic. Oropharynx and nasopharynx clear. No oropharyngeal erythema, moist oral mucosa  NECK:  Supple, no jugular venous distention. No thyroid enlargement, no tenderness.  LUNGS: Decreased air entry bilaterally with expiratory wheezing CARDIOVASCULAR: S1, S2 normal. No murmurs, rubs, or gallops.  ABDOMEN: Soft, nontender, nondistended. Bowel sounds present. No organomegaly or mass.  EXTREMITIES: No pedal edema, cyanosis, or clubbing. + 2 pedal & radial pulses b/l.   NEUROLOGIC: Cranial nerves II through XII are intact. No focal Motor or sensory deficits appreciated b/l PSYCHIATRIC: The patient is alert and oriented x 3. Good affect.  SKIN: No obvious rash, lesion, or ulcer.   LABORATORY PANEL:   CBC Recent Labs  Lab 02/06/18 1455  WBC  10.4  HGB 13.9  HCT 43.7  PLT 294   ------------------------------------------------------------------------------------------------------------------  Chemistries  Recent Labs  Lab 02/06/18 1455  NA 135  K 3.9  CL 96*  CO2 24  GLUCOSE 113*  BUN 21  CREATININE 1.63*  CALCIUM 9.2  AST 30  ALT 18  ALKPHOS 124  BILITOT 0.6   ------------------------------------------------------------------------------------------------------------------  Cardiac Enzymes No results for input(s): TROPONINI in the last 168 hours. ------------------------------------------------------------------------------------------------------------------  RADIOLOGY:  Dg Chest 2 View  Result Date: 02/06/2018 CLINICAL DATA:  Cough and joint pain. EXAM: CHEST - 2 VIEW COMPARISON:  Multiple chest x-rays since June 05, 2010 FINDINGS: Opacity in the lateral right lung base is similar since 2012, likely scarring. No  pneumothorax. No nodules or masses. Blunting of the right costophrenic angle is stable, either a tiny effusion or pleural thickening. No other acute abnormalities. IMPRESSION: 1. Probable scarring or chronic atelectasis in the right lung. 2. Pleural thickening versus a small pleural effusion on the right. Electronically Signed   By: Dorise Bullion III M.D   On: 02/06/2018 16:02     IMPRESSION AND PLAN:   * Acute bronchitis with acute hypoxic respiratory failure and sepsis present on admission Chest x-ray with no infiltrates. Check procalcitonin level Started on ceftriaxone and azithromycin.  Solu-Medrol.  Scheduled nebulizers. Sputum cultures. Wean oxygen as tolerated Repeat cxr in am  * Hypertension.  Continue home medications.   * Acute kidney injury over CKD stage III due to sepsis.  IV fluids.  Repeat labs in the morning.  Monitor input and output.  * Tobacco abuse- counseled to quit . 39minutes  *DVT prophylaxis with Lovenox  All the records are reviewed and case discussed with ED provider. Management plans discussed with the patient, family and they are in agreement.  CODE STATUS: Full code  TOTAL TIME TAKING CARE OF THIS PATIENT: 40 minutes.   Neita Carp M.D on 02/06/2018 at 5:46 PM  Between 7am to 6pm - Pager - [phone]  After 6pm go to [email or web address] - password [email or web address] [facility]  Office  [phone]  CC: Primary care physician; Dion Body, MD  Note: This dictation was prepared with Dragon dictation along with smaller phrase technology. Any transcriptional errors that result from this process are unintentional.

## 2018-02-06 NOTE — ED Notes (Signed)
First Nurse Note: Pt to ED from [facility] in. Pt dx with pneumonia. Per [place] when pt came in SpO2 was 82-83%. Pt used inhaler with improvement in SpO2 to 90's. Pt is in NAD at this time.

## 2018-02-06 NOTE — ED Triage Notes (Addendum)
Patient c/o cough, joint pain, headache, pains in neck and sob since Wednesday 10/30. Patient also reports fever that started Friday night. Febrile 101.3 upon arrival. [place] informed patient she has pneumonia after completing Xray. Oxygen sats 89-90% on RA. Patient placed on 2L O2.  1 set blood cultures sent to lab

## 2018-02-07 ENCOUNTER — Inpatient Hospital Stay: Payer: PPO

## 2018-02-07 ENCOUNTER — Inpatient Hospital Stay: Admit: 2018-02-07 | Payer: PPO

## 2018-02-07 LAB — BASIC METABOLIC PANEL
Anion gap: 8 (ref 5–15)
BUN: 23 mg/dL (ref 8–23)
CHLORIDE: 103 mmol/L (ref 98–111)
CO2: 26 mmol/L (ref 22–32)
CREATININE: 1.25 mg/dL — AB (ref 0.44–1.00)
Calcium: 8.5 mg/dL — ABNORMAL LOW (ref 8.9–10.3)
GFR calc non Af Amer: 42 mL/min — ABNORMAL LOW (ref >60)
GFR, EST AFRICAN AMERICAN: 49 mL/min — AB (ref >60)
Glucose, Bld: 135 mg/dL — ABNORMAL HIGH (ref 70–99)
Potassium: 3.8 mmol/L (ref 3.5–5.1)
SODIUM: 137 mmol/L (ref 135–145)

## 2018-02-07 LAB — BLOOD CULTURE ID PANEL (REFLEXED)
ACINETOBACTER BAUMANNII: NOT DETECTED
CANDIDA ALBICANS: NOT DETECTED
Candida glabrata: NOT DETECTED
Candida krusei: NOT DETECTED
Candida parapsilosis: NOT DETECTED
Candida tropicalis: NOT DETECTED
ENTEROBACTERIACEAE SPECIES: NOT DETECTED
ENTEROCOCCUS SPECIES: NOT DETECTED
Enterobacter cloacae complex: NOT DETECTED
Escherichia coli: NOT DETECTED
HAEMOPHILUS INFLUENZAE: NOT DETECTED
KLEBSIELLA OXYTOCA: NOT DETECTED
Klebsiella pneumoniae: NOT DETECTED
LISTERIA MONOCYTOGENES: NOT DETECTED
Methicillin resistance: NOT DETECTED
NEISSERIA MENINGITIDIS: NOT DETECTED
PSEUDOMONAS AERUGINOSA: NOT DETECTED
Proteus species: NOT DETECTED
STAPHYLOCOCCUS AUREUS BCID: NOT DETECTED
STREPTOCOCCUS AGALACTIAE: NOT DETECTED
STREPTOCOCCUS PYOGENES: NOT DETECTED
Serratia marcescens: NOT DETECTED
Staphylococcus species: DETECTED — AB
Streptococcus pneumoniae: NOT DETECTED
Streptococcus species: NOT DETECTED

## 2018-02-07 LAB — CBC
HEMATOCRIT: 38.8 % (ref 36.0–46.0)
Hemoglobin: 12.4 g/dL (ref 12.0–15.0)
MCH: 27.7 pg (ref 26.0–34.0)
MCHC: 32 g/dL (ref 30.0–36.0)
MCV: 86.8 fL (ref 80.0–100.0)
NRBC: 0 % (ref 0.0–0.2)
Platelets: 254 10*3/uL (ref 150–400)
RBC: 4.47 MIL/uL (ref 3.87–5.11)
RDW: 15.2 % (ref 11.5–15.5)
WBC: 5.4 10*3/uL (ref 4.0–10.5)

## 2018-02-07 LAB — LACTIC ACID, PLASMA: Lactic Acid, Venous: 1 mmol/L (ref 0.5–1.9)

## 2018-02-07 MED ORDER — LOSARTAN POTASSIUM 50 MG PO TABS
100.0000 mg | ORAL_TABLET | Freq: Every day | ORAL | Status: DC
  Administered 2018-02-07: 22:00:00 100 mg via ORAL
  Filled 2018-02-07: qty 2

## 2018-02-07 MED ORDER — ENOXAPARIN SODIUM 40 MG/0.4ML ~~LOC~~ SOLN
40.0000 mg | SUBCUTANEOUS | Status: DC
  Administered 2018-02-07: 40 mg via SUBCUTANEOUS
  Filled 2018-02-07: qty 0.4

## 2018-02-07 MED ORDER — IPRATROPIUM-ALBUTEROL 0.5-2.5 (3) MG/3ML IN SOLN
3.0000 mL | Freq: Three times a day (TID) | RESPIRATORY_TRACT | Status: DC
  Administered 2018-02-07 – 2018-02-08 (×3): 3 mL via RESPIRATORY_TRACT
  Filled 2018-02-07 (×4): qty 3

## 2018-02-07 MED ORDER — LOSARTAN POTASSIUM 50 MG PO TABS: 50.0000 mg | ORAL_TABLET | Freq: Every day | ORAL | Status: DC

## 2018-02-07 NOTE — Progress Notes (Signed)
PHARMACY - PHYSICIAN COMMUNICATION CRITICAL VALUE ALERT - BLOOD CULTURE IDENTIFICATION (BCID)  Lisa Wilkerson is an 72 y.o. female who presented to [facility] on 02/06/2018 with a chief complaint of SOB due to acute bronchitis  Assessment: Tmax 101.3, WBC 5.4    Name of physician (or Provider) Contacted: [place]  Current antibiotics: Ceftriaxone and Azithromycin  Changes to prescribed antibiotics recommended:  Continue with current antibiotics for now.  Results for orders placed or performed during the hospital encounter of 02/06/18  Blood Culture ID Panel (Reflexed) (Collected: 02/06/2018  4:43 PM)  Result Value Ref Range   Enterococcus species NOT DETECTED NOT DETECTED   Listeria monocytogenes NOT DETECTED NOT DETECTED   Staphylococcus species DETECTED (A) NOT DETECTED   Staphylococcus aureus (BCID) NOT DETECTED NOT DETECTED   Methicillin resistance NOT DETECTED NOT DETECTED   Streptococcus species NOT DETECTED NOT DETECTED   Streptococcus agalactiae NOT DETECTED NOT DETECTED   Streptococcus pneumoniae NOT DETECTED NOT DETECTED   Streptococcus pyogenes NOT DETECTED NOT DETECTED   Acinetobacter baumannii NOT DETECTED NOT DETECTED   Enterobacteriaceae species NOT DETECTED NOT DETECTED   Enterobacter cloacae complex NOT DETECTED NOT DETECTED   Escherichia coli NOT DETECTED NOT DETECTED   Klebsiella oxytoca NOT DETECTED NOT DETECTED   Klebsiella pneumoniae NOT DETECTED NOT DETECTED   Proteus species NOT DETECTED NOT DETECTED   Serratia marcescens NOT DETECTED NOT DETECTED   Haemophilus influenzae NOT DETECTED NOT DETECTED   Neisseria meningitidis NOT DETECTED NOT DETECTED   Pseudomonas aeruginosa NOT DETECTED NOT DETECTED   Candida albicans NOT DETECTED NOT DETECTED   Candida glabrata NOT DETECTED NOT DETECTED   Candida krusei NOT DETECTED NOT DETECTED   Candida parapsilosis NOT DETECTED NOT DETECTED   Candida tropicalis NOT DETECTED NOT DETECTED    Paulina Fusi, PharmD,  BCPS 02/07/2018 12:07 PM

## 2018-02-07 NOTE — Progress Notes (Signed)
BCID Call to clinical Pharmacist  Bcx:  2 of 4 bottles positive for Gram positive cocci- Staph SPECIES, Mec A negative.  On CTX/Azith  Called to Paulina Fusi Clinical Pharmacist ~[phone]  Chinita Greenland PharmD Clinical Pharmacist 02/07/2018

## 2018-02-07 NOTE — Plan of Care (Signed)
Pt updated on blood culture results and plan of care. Pt states and exhibits understanding. Pt. Medication schedule addressed today and adjusted to match home regimen per pt request. Pt has no complaints of pain and VS stable. All questions and concerns addressed at current.

## 2018-02-07 NOTE — Progress Notes (Signed)
Anticoagulation monitoring(Lovenox):  72yo  female ordered Lovenox 30 mg Q24h for DVT prevention. Dose was reduced for estimated CrCl less than 80ml/min on admission.  Filed Weights   02/06/18 1449 02/06/18 1848 02/07/18 0500  Weight: 153 lb 12.8 oz (69.8 kg) 141 lb 12.1 oz (64.3 kg) 152 lb 1.9 oz (69 kg)   BMI     Lab Results  Component Value Date   CREATININE 1.25 (H) 02/07/2018   CREATININE 1.63 (H) 02/06/2018   CREATININE 1.26 (H) 08/03/2016   Estimated Creatinine Clearance: 37.9 mL/min (A) (by C-G formula based on SCr of 1.25 mg/dL (H)). Hemoglobin & Hematocrit     Component Value Date/Time   HGB 12.4 02/07/2018 0541   HCT 38.8 02/07/2018 0541     Per Protocol for Patient with estCrcl > 30 ml/min and BMI < 40, will transition to Lovenox 40 mg Q24h.     Paulina Fusi, PharmD, BCPS 02/07/2018 1:58 PM

## 2018-02-07 NOTE — Progress Notes (Signed)
[place] [facility] [place] NAME: Lisa Wilkerson    MR#:  [id number]  DATE OF BIRTH:  [birth year]  SUBJECTIVE:  CHIEF COMPLAINT:  No chief complaint on file.  SOB better. Dry cough.  Off O2  REVIEW OF SYSTEMS:    Review of Systems  Constitutional: Positive for chills, fever and malaise/fatigue.  HENT: Negative for sore throat.   Eyes: Negative for blurred vision, double vision and pain.  Respiratory: Positive for cough and shortness of breath. Negative for hemoptysis and wheezing.   Cardiovascular: Negative for chest pain, palpitations, orthopnea and leg swelling.  Gastrointestinal: Negative for abdominal pain, constipation, diarrhea, heartburn, nausea and vomiting.  Genitourinary: Negative for dysuria and hematuria.  Musculoskeletal: Negative for back pain and joint pain.  Skin: Negative for rash.  Neurological: Negative for sensory change, speech change, focal weakness and headaches.  Endo/Heme/Allergies: Does not bruise/bleed easily.  Psychiatric/Behavioral: Negative for depression. The patient is not nervous/anxious.     DRUG ALLERGIES:   Allergies  Allergen Reactions  . Codeine Other (See Comments)    She states it paralyzes her and she can't move.    VITALS:  Blood pressure (!) 172/80, pulse 74, temperature 97.6 F (36.4 C), temperature source Oral, resp. rate 18, height 5\' 3"  (1.6 m), weight 69 kg, SpO2 93 %.  PHYSICAL EXAMINATION:   Physical Exam  GENERAL:  72 y.o.-year-old patient lying in the bed with no acute distress.  EYES: Pupils equal, round, reactive to light and accommodation. No scleral icterus. Extraocular muscles intact.  HEENT: Head atraumatic, normocephalic. Oropharynx and nasopharynx clear.  NECK:  Supple, no jugular venous distention. No thyroid enlargement, no tenderness.  LUNGS: Normal breath sounds bilaterally, no wheezing, rales, rhonchi. No use of accessory muscles of respiration.  CARDIOVASCULAR: S1, S2  normal. No murmurs, rubs, or gallops.  ABDOMEN: Soft, nontender, nondistended. Bowel sounds present. No organomegaly or mass.  EXTREMITIES: No cyanosis, clubbing or edema b/l.    NEUROLOGIC: Cranial nerves II through XII are intact. No focal Motor or sensory deficits b/l.   PSYCHIATRIC: The patient is alert and oriented x 3.  SKIN: No obvious rash, lesion, or ulcer.   LABORATORY PANEL:   CBC Recent Labs  Lab 02/07/18 0541  WBC 5.4  HGB 12.4  HCT 38.8  PLT 254   ------------------------------------------------------------------------------------------------------------------ Chemistries  Recent Labs  Lab 02/06/18 1455 02/07/18 0541  NA 135 137  K 3.9 3.8  CL 96* 103  CO2 24 26  GLUCOSE 113* 135*  BUN 21 23  CREATININE 1.63* 1.25*  CALCIUM 9.2 8.5*  AST 30  --   ALT 18  --   ALKPHOS 124  --   BILITOT 0.6  --    ------------------------------------------------------------------------------------------------------------------  Cardiac Enzymes Recent Labs  Lab 02/06/18 1455  TROPONINI <0.03   ------------------------------------------------------------------------------------------------------------------  RADIOLOGY:  Dg Chest 2 View  Result Date: 02/07/2018 CLINICAL DATA:  Hypoxia, chest pain. History of pneumonia, previous MI, current smoker. EXAM: CHEST - 2 VIEW COMPARISON:  PA and lateral chest x-ray of February 06, 2018 FINDINGS: The lungs are hyperinflated with hemidiaphragm flattening. There is a small right and trace left pleural effusion. There is no alveolar infiltrate. There is patchy increased density in the right mid and lower lung which is less conspicuous today. The heart is top-normal in size. The pulmonary vascularity is normal. There is calcification in the wall of the ascending and descending thoracic aorta. The bony thorax exhibits no acute abnormality. IMPRESSION: COPD-smoking related  changes. Subsegmental atelectasis or scarring in the right mid to  lower lung slightly less conspicuous today. Small bilateral pleural effusions greatest on the right. No alveolar pneumonia. Thoracic aortic atherosclerosis. Electronically Signed   By: David  Martinique M.D.   On: 02/07/2018 08:05   Dg Chest 2 View  Result Date: 02/06/2018 CLINICAL DATA:  Cough and joint pain. EXAM: CHEST - 2 VIEW COMPARISON:  Multiple chest x-rays since June 05, 2010 FINDINGS: Opacity in the lateral right lung base is similar since 2012, likely scarring. No pneumothorax. No nodules or masses. Blunting of the right costophrenic angle is stable, either a tiny effusion or pleural thickening. No other acute abnormalities. IMPRESSION: 1. Probable scarring or chronic atelectasis in the right lung. 2. Pleural thickening versus a small pleural effusion on the right. Electronically Signed   By: Dorise Bullion III M.D   On: 02/06/2018 16:02   ASSESSMENT AND PLAN:   * MSSA bacteremia-etiology unclear.  Had skin biopsy and cauterization 3 weeks back. On IV ceftriaxone and azithromycin for possible pneumonia. Repeat blood cx Echocardiogram ID consulted and sent message to Dr. Delaine Lame  *Acute bronchitis with acute hypoxic respiratory failure Weaned off oxygen.  No wheezing today.  Nebulizers as needed  *Sepsis present on admission has resolved  * HTN Continue home meds  * DVT prophylaxis Lovenox  All the records are reviewed and case discussed with Care Management/Social Worker Management plans discussed with the patient, family and they are in agreement.  CODE STATUS: FULL CODE  DVT Prophylaxis: SCDs  TOTAL TIME TAKING CARE OF THIS PATIENT: 35 minutes.   POSSIBLE D/C IN 2-3 DAYS, DEPENDING ON CLINICAL CONDITION.  Leia Alf Toan Mort M.D on 02/07/2018 at 1:24 PM  Between 7am to 6pm - Pager - [phone]  After 6pm go to [email or web address] - password [email or web address] [facility]  Office  [phone]  CC: Primary care physician; Dion Body, MD  Note: This  dictation was prepared with Dragon dictation along with smaller phrase technology. Any transcriptional errors that result from this process are unintentional.

## 2018-02-08 ENCOUNTER — Inpatient Hospital Stay
Admit: 2018-02-08 | Discharge: 2018-02-08 | Disposition: A | Discharge status: Home / Self Care | Payer: PPO | Attending: Internal Medicine | Admitting: Internal Medicine

## 2018-02-08 LAB — ECHOCARDIOGRAM COMPLETE
HEIGHTINCHES: 63 in
Weight: 2395.2 oz

## 2018-02-08 MED ORDER — PREDNISONE 10 MG (21) PO TBPK: ORAL_TABLET | ORAL | 0 refills | Status: DC

## 2018-02-08 MED ORDER — ALBUTEROL SULFATE HFA 108 (90 BASE) MCG/ACT IN AERS: 2.0000 | INHALATION_SPRAY | Freq: Four times a day (QID) | RESPIRATORY_TRACT | 2 refills | Status: DC | PRN

## 2018-02-08 MED ORDER — LEVOFLOXACIN 500 MG PO TABS: 250.0000 mg | ORAL_TABLET | Freq: Every day | ORAL | 0 refills | Status: DC

## 2018-02-08 MED ORDER — FLUTICASONE-SALMETEROL 100-50 MCG/DOSE IN AEPB: 1.0000 | INHALATION_SPRAY | Freq: Two times a day (BID) | RESPIRATORY_TRACT | 0 refills | Status: DC

## 2018-02-08 NOTE — Progress Notes (Signed)
Pt ambulated in hall w/out oxygen / stats 52 percent Collier Bullock Rn

## 2018-02-08 NOTE — Discharge Summary (Signed)
[place] [facility] [place] NAME: Lisa Wilkerson    MR#:  [id number]  DATE OF BIRTH:  11-02-1945  DATE OF ADMISSION:  02/06/2018 ADMITTING PHYSICIAN: Hillary Bow, MD  DATE OF DISCHARGE: 02/08/2018  3:55 PM  PRIMARY CARE PHYSICIAN: Dion Body, MD   ADMISSION DIAGNOSIS:  SOB (shortness of breath) [R06.02] Hypoxia [R09.02] COPD exacerbation (HCC) [J44.1] Community acquired pneumonia of right lower lobe of lung ([place]) [J18.1] Sepsis, due to unspecified organism, unspecified whether acute organ dysfunction present ([place]) [A41.9]  DISCHARGE DIAGNOSIS:  Active Problems:   Acute bronchitis   SECONDARY DIAGNOSIS:   Past Medical History:  Diagnosis Date  . Arthritis   . Basal cell carcinoma of skin   . Chronic kidney disease    Stage 3 Chronic Kidney Disease  . Coronary artery disease   . Hyperlipidemia   . Hypertension   . Myocardial infarction ([place]) 07/2009  . Peripheral vascular disease ([place])   . Psoriasis   . Sleep apnea    no CPAP  . Squamous cell skin cancer   . TIA (transient ischemic attack) 2014   no deficits  . Vertigo    no issues for several years  . Wears dentures    full upper and lower     ADMITTING HISTORY  HISTORY OF PRESENT ILLNESS:  Lisa Wilkerson  is a 72 y.o. female with a known history of hypertension, CKD 3, tobacco abuse presents to the emergency room from urgent care where she went for fever, shortness of breath and myalgias.  Patient was found to be hypoxic in the 80s on room air with temperature 101.3.  Chest x-ray clear.  Sepsis with lactic acid of 2.7.  Patient is being admitted to the hospitalist service. No sick contacts.  Influenza checked at urgent clinic is negative.   HOSPITAL COURSE:   *Acute bronchitis *Acute hypoxic respiratory failure *Sepsis *Hypertension *Acute kidney injury present on admission  Patient was admitted to medical floor and started on IV steroids, IV antibiotics and  oxygen support.  Scheduled nebulizers.  With this she improved well.  By day of discharge patient is off oxygen and saturating 96% on room air while ambulation.  Blood cultures initially had coagulase-negative staph which is a contaminant.  Afebrile.  Normal WBC.  Chest x-ray did not show any infiltrates.  Sepsis has resolved.  Patient is being discharged home with Levaquin, prednisone and inhalers.  She will follow-up with primary care physician Dr. Netty Starring in 1 week.  Also referral made to Dr. Raul Del of pulmonary as patient will need a pulmonary function test for diagnosis of COPD which she most likely has.  CONSULTS OBTAINED:    DRUG ALLERGIES:   Allergies  Allergen Reactions  . Codeine Other (See Comments)    She states it paralyzes her and she can't move.    DISCHARGE MEDICATIONS:   Allergies as of 02/08/2018      Reactions   Codeine Other (See Comments)   She states it paralyzes her and she can't move.      Medication List    STOP taking these medications   hydrALAZINE 25 MG tablet Commonly known as:  APRESOLINE     TAKE these medications   albuterol 108 (90 Base) MCG/ACT inhaler Commonly known as:  PROVENTIL HFA;VENTOLIN HFA Inhale 2 puffs into the lungs every 6 (six) hours as needed for wheezing or shortness of breath. What changed:  Another medication with the same name was added. Make sure  you understand how and when to take each.   albuterol 108 (90 Base) MCG/ACT inhaler Commonly known as:  PROVENTIL HFA;VENTOLIN HFA Inhale 2 puffs into the lungs every 6 (six) hours as needed for wheezing or shortness of breath. What changed:  You were already taking a medication with the same name, and this prescription was added. Make sure you understand how and when to take each.   amLODipine 10 MG tablet Commonly known as:  NORVASC Take 10 mg by mouth daily.   aspirin 81 MG tablet Take 81 mg by mouth daily.   atorvastatin 40 MG tablet Commonly known as:  LIPITOR Take  40 mg by mouth daily.   clobetasol ointment 0.05 % Commonly known as:  TEMOVATE Apply 1 application topically 2 (two) times daily as needed.   clopidogrel 75 MG tablet Commonly known as:  PLAVIX Take 75 mg by mouth daily.   clotrimazole-betamethasone cream Commonly known as:  LOTRISONE Apply 1 application topically 2 (two) times daily as needed.   COSENTYX 150 MG/ML Sosy Generic drug:  Secukinumab Inject into the skin.   fluticasone 50 MCG/ACT nasal spray Commonly known as:  FLONASE instill 2 sprays into each nostril once daily if needed for RHINITIS.   Fluticasone-Salmeterol 100-50 MCG/DOSE Aepb Commonly known as:  ADVAIR Inhale 1 puff into the lungs 2 (two) times daily.   hydrocortisone 2.5 % ointment Apply topically.   ILUMYA 100 MG/ML subcutaneous injection Generic drug:  tildrakizumab-asmn Inject 100 mg into the skin every 4 (four) months.   isosorbide mononitrate 30 MG 24 hr tablet Commonly known as:  IMDUR Take 30 mg by mouth daily.   levofloxacin 500 MG tablet Commonly known as:  LEVAQUIN Take 0.5 tablets (250 mg total) by mouth daily. Start taking on:  02/09/2018   losartan 50 MG tablet Commonly known as:  COZAAR Take 50 mg by mouth daily.   ondansetron 4 MG tablet Commonly known as:  ZOFRAN Take 1 tablet (4 mg total) by mouth every 8 (eight) hours as needed for nausea or vomiting.   predniSONE 10 MG (21) Tbpk tablet Commonly known as:  STERAPRED UNI-PAK 21 TAB 6 tabs day 1 and taper 10 mg a day - 6 days   traMADol 50 MG tablet Commonly known as:  ULTRAM Take 50 mg by mouth every 6 (six) hours as needed for moderate pain.       Today   VITAL SIGNS:  Blood pressure (!) 149/79, pulse 72, temperature 97.9 F (36.6 C), temperature source Oral, resp. rate 16, height 5\' 3"  (1.6 m), weight 67.9 kg, SpO2 92 %.  I/O:    Intake/Output Summary (Last 24 hours) at 02/08/2018 1640 Last data filed at 02/08/2018 1406 Gross per 24 hour  Intake 360 ml   Output -  Net 360 ml    PHYSICAL EXAMINATION:  Physical Exam  GENERAL:  72 y.o.-year-old patient lying in the bed with no acute distress.  LUNGS: Normal breath sounds bilaterally, no wheezing, rales,rhonchi or crepitation. No use of accessory muscles of respiration.  CARDIOVASCULAR: S1, S2 normal. No murmurs, rubs, or gallops.  ABDOMEN: Soft, non-tender, non-distended. Bowel sounds present. No organomegaly or mass.  NEUROLOGIC: Moves all 4 extremities. PSYCHIATRIC: The patient is alert and oriented x 3.  SKIN: No obvious rash, lesion, or ulcer.   DATA REVIEW:   CBC Recent Labs  Lab 02/07/18 0541  WBC 5.4  HGB 12.4  HCT 38.8  PLT 254    Chemistries  Recent Labs  Lab  02/06/18 1455 02/07/18 0541  NA 135 137  K 3.9 3.8  CL 96* 103  CO2 24 26  GLUCOSE 113* 135*  BUN 21 23  CREATININE 1.63* 1.25*  CALCIUM 9.2 8.5*  AST 30  --   ALT 18  --   ALKPHOS 124  --   BILITOT 0.6  --     Cardiac Enzymes Recent Labs  Lab 02/06/18 1455  TROPONINI <0.03    Microbiology Results  Results for orders placed or performed during the hospital encounter of 02/06/18  Culture, blood (Routine x 2)     Status: None (Preliminary result)   Collection Time: 02/06/18  2:55 PM  Result Value Ref Range Status   Specimen Description BLOOD L AC  Final   Special Requests   Final    BOTTLES DRAWN AEROBIC AND ANAEROBIC Blood Culture adequate volume   Culture   Final    NO GROWTH 2 DAYS Performed at [facility], [address] [place], [place] [address]    Report Status PENDING  Incomplete  Culture, blood (Routine x 2)     Status: Abnormal (Preliminary result)   Collection Time: 02/06/18  4:43 PM  Result Value Ref Range Status   Specimen Description   Final    BLOOD R AC Performed at [facility], [address] [place], [place] [address]    Special Requests   Final    BOTTLES DRAWN AEROBIC AND ANAEROBIC Blood Culture adequate volume Performed at [facility], [address] [place], [place] [address]    Culture  Setup Time   Final    GRAM POSITIVE COCCI IN BOTH AEROBIC AND ANAEROBIC BOTTLES CRITICAL RESULT CALLED TO, READ BACK BY AND VERIFIED WITH: KRISTIN MERRILL AT 1054 ION 02/07/2018 JJB    Culture (A)  Final    STAPHYLOCOCCUS SPECIES (COAGULASE NEGATIVE) THE SIGNIFICANCE OF ISOLATING THIS ORGANISM FROM A SINGLE SET OF BLOOD CULTURES WHEN MULTIPLE SETS ARE DRAWN IS UNCERTAIN. PLEASE NOTIFY THE MICROBIOLOGY DEPARTMENT WITHIN ONE WEEK IF SPECIATION AND SENSITIVITIES ARE REQUIRED. Performed at [facility], [place] [address], [place], [place]  [address]    Report Status PENDING  Incomplete  Blood Culture ID Panel (Reflexed)     Status: Abnormal   Collection Time: 02/06/18  4:43 PM  Result Value Ref Range Status   Enterococcus species NOT DETECTED NOT DETECTED Final   Listeria monocytogenes NOT DETECTED NOT DETECTED Final   Staphylococcus species DETECTED (A) NOT DETECTED Final    Comment: Methicillin (oxacillin) susceptible coagulase negative staphylococcus. Possible blood culture contaminant (unless isolated from more than one blood culture draw or clinical case suggests pathogenicity). No antibiotic treatment is indicated for blood  culture contaminants. CRITICAL RESULT CALLED TO, READ BACK BY AND VERIFIED WITH: KRISTIN MERRILL AT 1054 ON 02/07/2018 JJB    Staphylococcus aureus (BCID) NOT DETECTED NOT DETECTED Final   Methicillin resistance NOT DETECTED NOT DETECTED Final   Streptococcus species NOT DETECTED NOT DETECTED Final   Streptococcus agalactiae NOT DETECTED NOT DETECTED Final   Streptococcus pneumoniae NOT DETECTED NOT DETECTED Final   Streptococcus pyogenes NOT DETECTED NOT DETECTED Final   Acinetobacter baumannii NOT DETECTED NOT DETECTED Final   Enterobacteriaceae species NOT DETECTED NOT DETECTED Final   Enterobacter cloacae complex NOT DETECTED NOT DETECTED Final   Escherichia coli NOT DETECTED NOT  DETECTED Final   Klebsiella oxytoca NOT DETECTED NOT DETECTED Final   Klebsiella pneumoniae NOT DETECTED NOT DETECTED Final   Proteus species NOT  DETECTED NOT DETECTED Final   Serratia marcescens NOT DETECTED NOT DETECTED Final   Haemophilus influenzae NOT DETECTED NOT DETECTED Final   Neisseria meningitidis NOT DETECTED NOT DETECTED Final   Pseudomonas aeruginosa NOT DETECTED NOT DETECTED Final   Candida albicans NOT DETECTED NOT DETECTED Final   Candida glabrata NOT DETECTED NOT DETECTED Final   Candida krusei NOT DETECTED NOT DETECTED Final   Candida parapsilosis NOT DETECTED NOT DETECTED Final   Candida tropicalis NOT DETECTED NOT DETECTED Final    Comment: Performed at [facility], [address] [place], [place] [address]  CULTURE, BLOOD (ROUTINE X 2) w Reflex to ID Panel     Status: None (Preliminary result)   Collection Time: 02/07/18  1:38 PM  Result Value Ref Range Status   Specimen Description BLOOD BLOOD RIGHT HAND  Final   Special Requests   Final    BOTTLES DRAWN AEROBIC AND ANAEROBIC Blood Culture results may not be optimal due to an inadequate volume of blood received in culture bottles   Culture   Final    NO GROWTH < 24 HOURS Performed at [facility], [address] [place], [place] [address]    Report Status PENDING  Incomplete  CULTURE, BLOOD (ROUTINE X 2) w Reflex to ID Panel     Status: None (Preliminary result)   Collection Time: 02/07/18  1:46 PM  Result Value Ref Range Status   Specimen Description BLOOD BLOOD RIGHT WRIST  Final   Special Requests   Final    BOTTLES DRAWN AEROBIC AND ANAEROBIC Blood Culture adequate volume   Culture   Final    NO GROWTH < 24 HOURS Performed at [facility], [address], [place], [place] [address]    Report Status PENDING  Incomplete    RADIOLOGY:  Dg Chest 2 View  Result Date: 02/07/2018 CLINICAL DATA:  Hypoxia, chest pain. History of pneumonia, previous MI, current smoker. EXAM:  CHEST - 2 VIEW COMPARISON:  PA and lateral chest x-ray of February 06, 2018 FINDINGS: The lungs are hyperinflated with hemidiaphragm flattening. There is a small right and trace left pleural effusion. There is no alveolar infiltrate. There is patchy increased density in the right mid and lower lung which is less conspicuous today. The heart is top-normal in size. The pulmonary vascularity is normal. There is calcification in the wall of the ascending and descending thoracic aorta. The bony thorax exhibits no acute abnormality. IMPRESSION: COPD-smoking related changes. Subsegmental atelectasis or scarring in the right mid to lower lung slightly less conspicuous today. Small bilateral pleural effusions greatest on the right. No alveolar pneumonia. Thoracic aortic atherosclerosis. Electronically Signed   By: David  Martinique M.D.   On: 02/07/2018 08:05    Follow up with PCP in 1 week.  Management plans discussed with the patient, family and they are in agreement.  CODE STATUS:     Code Status Orders  (From admission, onward)         Start     Ordered   02/06/18 1744  Full code  Continuous     02/06/18 1744        Code Status History    Date Active Date Inactive Code Status Order ID Comments User Context   11/28/2015 1735 11/29/2015 1630 Full Code [id number]  Algernon Huxley, MD Inpatient      TOTAL TIME TAKING CARE OF THIS PATIENT ON DAY OF DISCHARGE: more than 30 minutes.   Neita Carp M.D on  02/08/2018 at 4:40 PM  Between 7am to 6pm - Pager - [phone]  After 6pm go to [email or web address] - password [email or web address] [place] [facility]  Office  [phone]  CC: Primary care physician; Dion Body, MD  Note: This dictation was prepared with Dragon dictation along with smaller phrase technology. Any transcriptional errors that result from this process are unintentional.

## 2018-02-08 NOTE — Discharge Instructions (Signed)
Resume diet and activity as before ° ° °

## 2018-02-08 NOTE — Progress Notes (Signed)
*  PRELIMINARY RESULTS* Echocardiogram 2D Echocardiogram has been performed.  Lisa Wilkerson 02/08/2018, 9:12 AM

## 2018-02-09 LAB — CULTURE, BLOOD (ROUTINE X 2): Special Requests: ADEQUATE

## 2018-02-11 LAB — CULTURE, BLOOD (ROUTINE X 2)
Culture: NO GROWTH
SPECIAL REQUESTS: ADEQUATE

## 2018-02-12 LAB — CULTURE, BLOOD (ROUTINE X 2)
CULTURE: NO GROWTH
Culture: NO GROWTH
Special Requests: ADEQUATE

## 2018-02-14 DIAGNOSIS — J209 Acute bronchitis, unspecified: Secondary | ICD-10-CM | POA: Diagnosis not present

## 2018-02-15 DIAGNOSIS — J189 Pneumonia, unspecified organism: Secondary | ICD-10-CM | POA: Diagnosis not present

## 2018-02-15 DIAGNOSIS — G4733 Obstructive sleep apnea (adult) (pediatric): Secondary | ICD-10-CM | POA: Diagnosis not present

## 2018-02-15 DIAGNOSIS — F17200 Nicotine dependence, unspecified, uncomplicated: Secondary | ICD-10-CM | POA: Diagnosis not present

## 2018-02-15 DIAGNOSIS — R0609 Other forms of dyspnea: Secondary | ICD-10-CM | POA: Diagnosis not present

## 2018-02-15 DIAGNOSIS — L578 Other skin changes due to chronic exposure to nonionizing radiation: Secondary | ICD-10-CM | POA: Diagnosis not present

## 2018-02-15 DIAGNOSIS — J449 Chronic obstructive pulmonary disease, unspecified: Secondary | ICD-10-CM | POA: Diagnosis not present

## 2018-02-15 DIAGNOSIS — L400 Psoriasis vulgaris: Secondary | ICD-10-CM | POA: Diagnosis not present

## 2018-02-15 DIAGNOSIS — Z85828 Personal history of other malignant neoplasm of skin: Secondary | ICD-10-CM | POA: Diagnosis not present

## 2018-02-21 ENCOUNTER — Other Ambulatory Visit: Payer: Self-pay

## 2018-02-21 NOTE — Patient Outreach (Signed)
[facility] [facility]) Care Management  02/21/2018  Lisa Wilkerson Dec 19, 1945 [id number]  TELEPHONE SCREENING Referral date: 02/09/18 Referral source: HTA referral Referral reason: medication assistance Insurance: HTA Attempt #2  Telephone call to patient regarding referral. Unable to reach patient. HIPAA compliant voice message left with call back phone number.   PLAN: RNCM will attempt 2nd telephone call to patient within 4 business days. RNCM will send outreach letter.   Quinn Plowman RN,BSN, [place] Telephonic  [phone]

## 2018-02-25 ENCOUNTER — Other Ambulatory Visit: Payer: Self-pay

## 2018-02-25 NOTE — Patient Outreach (Signed)
[place] [facility]) Care Management  02/25/2018  TROI BECHTOLD July 16, 1945 [id number]  TELEPHONE SCREENING Referral date: 02/09/18 Referral source: HTA referral Referral reason: medication assistance Insurance: HTA Attempt #2  Telephone call to patient regarding.  Unable to reach patient. HIPAA compliant voice message left with call back phone number.   PLAN: RNCM will attempt #3rd telephone call to patient within 4 business days.   Quinn Plowman RN,BSN, [place] Telephonic  [phone]

## 2018-03-01 ENCOUNTER — Other Ambulatory Visit: Payer: Self-pay

## 2018-03-01 DIAGNOSIS — I10 Essential (primary) hypertension: Secondary | ICD-10-CM | POA: Diagnosis not present

## 2018-03-01 DIAGNOSIS — N183 Chronic kidney disease, stage 3 (moderate): Secondary | ICD-10-CM | POA: Diagnosis not present

## 2018-03-01 DIAGNOSIS — E782 Mixed hyperlipidemia: Secondary | ICD-10-CM | POA: Diagnosis not present

## 2018-03-01 DIAGNOSIS — R7303 Prediabetes: Secondary | ICD-10-CM | POA: Diagnosis not present

## 2018-03-01 NOTE — Patient Outreach (Addendum)
[place] [facility]) Care Management  03/01/2018  KEHINDE TOTZKE July 27, 1945 [id number]  TELEPHONE SCREENING Referral date:02/09/18 Referral source:HTA referral Referral reason:medication assistance Insurance:HTA  Telephone call to patient regarding HTA referral. HIPAA verified with patient. Explained reason for call. Patient states  Her doctor recently put her on inhalers for  COPD.   Patient states she is having difficulty affording her Advair and Spiriva.  Patient states the Advair is $45 per month. She reports her son assisted her with getting the Advair recently.  Patient states she has not been able to purchase the Spiriva.    RNCM discussed and offered [facility] care management services to patient. Patient states she feels she is managing her COPD well at this time therefore not needing a nurse to follow up. Patient verbally agreed to follow up with [facility] pharmacist for medication assistance.  Patient states she is unable to complete this call at this time. She states she is currently at her doctors office. Patient request call back to complete referral process.    PLAN: RNCM will attempt 2nd telephone outreach to patient within 4 business days to complete screening call and refer to [facility] care management pharmacist.   Quinn Plowman RN,BSN,CCM [facility] Telephonic  [phone]

## 2018-03-02 ENCOUNTER — Other Ambulatory Visit: Payer: Self-pay

## 2018-03-02 NOTE — Patient Outreach (Signed)
[place] [facility]) Care Management  03/02/2018  NAKIEA METZNER 1945/10/04 [id number]  TELEPHONE SCREENING and TRANSITION OF CARE  Referral date:02/09/18 Referral source:HTA referral Referral reason:medication assistance Insurance:HTA  Telephone call to patient for follow up. HIPAA verified.  Patient verbally agreed to follow up with [facility] pharmacist for medication assistance.  Patient  States she was in the hospital within the past month for pneumonia. Patient states she has completed her antibiotics. Patient denies any ongoing symptoms or concerns.  RNCM discussed and offered ongoing transition of care follow up.  Patient declined. Patient states she is doing fine and managing her care.   PLAN: RNCM will refer patient to [facility] pharmacist.   Quinn Plowman RN,BSN,CCM [facility] Telephonic  [phone]

## 2018-03-07 ENCOUNTER — Other Ambulatory Visit: Payer: Self-pay | Admitting: Pharmacist

## 2018-03-07 NOTE — Patient Outreach (Signed)
[facility]) Care Management  [place]   03/07/2018  Lisa Wilkerson [birth year] [id number]   Reason for referral: medication assistance, medication management  Referral source: HTA concierge.  Current insurance: HTA   PMHx: Recent hospitalization for acute bronchitis 02/06/2018 - 02/08/2018, carotid artery plaque s/p endarterectomy, hx TIA, HTN, HLD, CAD, CKD-III, tobacco abuse, diet controlled diabetes, hx bilateral ear tube placement 5/'18, plaque psoriasis  Outreach: Successful call to Lisa Wilkerson today. HIPAA identifiers verified.  Patient agreeable to review medications telephonically.  She reports she manages her medications and has no problems with adherence.  She is interested in learning about possibly medication assistance programs for her inhalers.    Objective: Lab Results  Component Value Date   CREATININE 1.25 (H) 02/07/2018   CREATININE 1.63 (H) 02/06/2018   CREATININE 1.26 (H) 08/03/2016    No results found for: HGBA1C  Lipid Panel  No results found for: CHOL, TRIG, HDL, CHOLHDL, VLDL, LDLCALC, LDLDIRECT  BP Readings from Last 3 Encounters:  02/08/18 (!) 149/79  08/04/17 112/78  04/02/17 (!) 174/104    Allergies  Allergen Reactions  . Codeine Other (See Comments)    She states it paralyzes her and she can't move.    Medications Reviewed Today    Reviewed by Lisa Wilkerson, RPH (Pharmacist) on 03/07/18 at 1523  Med List Status: <None>  Medication Order Taking? Sig Documenting Provider Last Dose Status Informant  albuterol (PROVENTIL HFA;VENTOLIN HFA) 108 (90 Base) MCG/ACT inhaler [id number] Yes Inhale 2 puffs into the lungs every 6 (six) hours as needed for wheezing or shortness of breath. Loney Hering, MD Taking Active            Med Note [facility] Wilkerson, Willey Blade   Wed Aug 04, 2017  6:50 AM) Last used 6+ months ago  amLODipine (NORVASC) 10 MG tablet [id number] Yes Take 10 mg by mouth daily. [provider] Taking  Active   aspirin 81 MG tablet [id number] Yes Take 81 mg by mouth daily. [provider] Taking Active Self  atorvastatin (LIPITOR) 40 MG tablet [id number] Yes Take 40 mg by mouth daily. [provider] Taking Active Self           Med Note Arvella Nigh, [place] A   Wed Nov 20, 2015 10:03 AM)    clobetasol ointment (TEMOVATE) 0.05 % [id number] Yes Apply 1 application topically 2 (two) times daily as needed.  [provider] Taking Active Self           Med Note Arvella Nigh, Lilia Pro A   Wed Nov 20, 2015 10:03 AM)    clopidogrel (PLAVIX) 75 MG tablet [id number] Yes Take 75 mg by mouth daily. [provider] Taking Active Self           Med Note Larwance Sachs A   Wed Nov 20, 2015 10:03 AM)    clotrimazole-betamethasone Donalynn Furlong) cream [id number] Yes Apply 1 application topically 2 (two) times daily as needed.  [provider] Taking Active Self           Med Note Arvella Nigh, [place] A   Wed Nov 20, 2015 10:04 AM)    fluticasone (FLONASE) 50 MCG/ACT nasal spray [id number] Yes instill 2 sprays into each nostril once daily if needed for RHINITIS. [provider] Taking Active Self           Med Note Arvella Nigh, [place] A   Wed Nov 20, 2015 10:04 AM)    Fluticasone-Salmeterol (ADVAIR DISKUS) 100-50 MCG/DOSE AEPB  [id number] Yes Inhale 1 puff into the lungs 2 (two) times daily. Hillary Bow, MD Taking Active   isosorbide mononitrate (IMDUR) 30 MG 24 hr tablet [id number] Yes Take 30 mg by mouth daily. [provider] Taking Active Self           Med Note Arvella Nigh, [place] A   Wed Nov 20, 2015 10:04 AM)    losartan (COZAAR) 50 MG tablet [id number] Yes Take 50 mg by mouth daily. [provider] Taking Active   tildrakizumab-asmn Judeth Porch) 100 MG/ML subcutaneous injection [id number] Yes Inject 100 mg into the skin every 4 (four) months. [provider] Taking Active            Med Note Iva Lento, Louann Liv Mar 07, 2018  3:22 PM)    tiotropium  (SPIRIVA) 18 MCG inhalation capsule [id number] No Place 18 mcg into inhaler and inhale daily. [provider] Not Taking Active   traMADol (ULTRAM) 50 MG tablet [id number] Yes Take 50 mg by mouth every 6 (six) hours as needed for moderate pain. [provider] Taking Active Self          ASSESSMENT: Date Discharged from Hospital: 02/08/2018 Date Medication Reconciliation Performed: 03/07/2018   Medications Discontinued at Discharge:   Hydralazine  New Medications at Discharge:   Advair  Levofloxacin  Prednisone  Medications with Dose Adjustments at Discharge: . Albuterol  Patient was recently discharged from hospital and all medications have been reviewed.  Drugs sorted by system:  Cardiovascular: amlodipine, aspirin 38m, atorvastatin, clopidogrel, isosorbide mononitrate, losartan  Pulmonary/Allergy: tiotropium, fluticasone-salmeterol, albuterol, fluticasone NS  Topical:clobetasol oint, clotrimazole-betamethasone cream  Pain:tramadol  Miscellaneous: tildrakizumab-asmn  Medication Review Findings:  . Has completed course of levaquin and predisone post-discharge . Not taking Spiriva due to co-pay $45, cannot afford  Medication Assistance Findings:  Extra Help:   _0  Already receiving Full Extra Help  _1  Already receiving Partial Extra Help  _2  Eligible based on reported income and assets  _3  Not Eligible based on reported income and assets  Patient Assistance Programs: 1) Spiriva made by [employer] Income requirement met: _4  Yes _5  No _6  Unknown o Out-of-pocket prescription expenditure met:   _7  Yes _8  No  _9  Unknown  <<TXMIWOEHOZYYQMGN>_0<\/IBBCWUGQBVQXIHWT>_88 Not applicable - Patient agreeable to apply        2)  Advair made by [employer] Income requirement met: _11  Yes _12  No  _13  Unknown o Out-of-pocket prescription expenditure met:  _14  Yes _15  No   _16  Unknown <<EKCMKLKJZPHXTAVW>_9<\/VXYIAXKPVVZSMOLM>_78 Not applicable - Option to substitute to DSilverdalemade by [employer]Vwhich does not have an out-of-pocket expenditure  requirement.   - Patient agreeable to discuss substitution with PCP tomorrow during office visit - [employer] also makes an albuterol inhaler that patient can apply for at the same time   Additional medication assistance options reviewed with patient as warranted:  No other options identified  Plan: Patient has PCP appt tomorrow and will [facility] and applying for Spiriva + Proventil.   Will f/u with patient tomorrow afternoon for decision.   CRalene Bathe PharmD, [phone]

## 2018-03-08 ENCOUNTER — Ambulatory Visit: Payer: Self-pay | Admitting: Pharmacist

## 2018-03-08 ENCOUNTER — Other Ambulatory Visit: Payer: Self-pay | Admitting: Pharmacist

## 2018-03-08 DIAGNOSIS — R7303 Prediabetes: Secondary | ICD-10-CM | POA: Diagnosis not present

## 2018-03-08 DIAGNOSIS — Z23 Encounter for immunization: Secondary | ICD-10-CM | POA: Diagnosis not present

## 2018-03-08 DIAGNOSIS — N183 Chronic kidney disease, stage 3 (moderate): Secondary | ICD-10-CM | POA: Diagnosis not present

## 2018-03-08 DIAGNOSIS — I10 Essential (primary) hypertension: Secondary | ICD-10-CM | POA: Diagnosis not present

## 2018-03-08 DIAGNOSIS — E782 Mixed hyperlipidemia: Secondary | ICD-10-CM | POA: Diagnosis not present

## 2018-03-08 NOTE — Patient Outreach (Signed)
[place] [facility]) Care Management  [place] 03/08/2018  Lisa Wilkerson May 09, 1945 [id number]  Reason for call: F/u with patient after PCP visit regarding decision with inhaler substitution  Patient reports that her PCP recommended that inhaler substitution decision be made by her pulmonologist, Dr. Raul Del.   Plan: Will outreach Dr. Raul Del regarding inhalers and Advair --> Dulera substitution  [name], PharmD, [place] [phone]

## 2018-03-09 ENCOUNTER — Other Ambulatory Visit: Payer: Self-pay | Admitting: Pharmacist

## 2018-03-09 ENCOUNTER — Other Ambulatory Visit: Payer: Self-pay | Admitting: Pharmacy Technician

## 2018-03-09 NOTE — Patient Outreach (Signed)
[place] [facility]) Care Management  03/09/2018  Lisa Wilkerson [birth year] [id number]                                                  Medication Assistance Referral  Referral From: [place] Patient application portion:  Mail  Provider application portion:  Mail to Dr. Wallene Huh  Medication/Company: Proventil HFA/[employer] Patient application portion:  Mail  Provider application portion:  Mail to Dr. Wallene Huh  Medication/Company: Spiriva/[employer] Patient application portion:  Mail  Provider application portion: Faxed to Dr. Wallene Huh   Follow up:  Will follow up with patient in 5-7 business days to confirm application(s) have been received.  Lisa Wilkerson Certified Pharmacy Technician [place] Management Direct Dial:[phone]

## 2018-03-09 NOTE — Patient Outreach (Signed)
[place] [facility]) Care Management  [place] 03/09/2018  Lisa Wilkerson [birth year] [id number]  Reason for call: patient assistance   Care coordination call to Dr. Melody Haver Fleming's office today.  Message left with RN regarding request for Advair --> Dulera substitution and informed about Albuterol and Spiriva applications.  Dr. Melody Haver is out of the office until Friday, Dec 6.  RN recommends that applications be faxed (Spiriva) and mailed Lisa Wilkerson + Lisa Wilkerson) so that if provider approves substitution, paperwork can be filled out sooner.    Outreach call to Lisa Wilkerson to provide update.  Patient voiced understanding.  She will look for HTA envelope with applications and will return via mail.    Plan: I will route patient assistance letter to [place] technician who will coordinate patient assistance program application process for medications listed above.  [facility] pharmacy technician will assist with obtaining all required documents from both patient and provider(s) and submit application(s) once completed.    Lisa Wilkerson, PharmD, [place] [phone]

## 2018-04-01 ENCOUNTER — Encounter (INDEPENDENT_AMBULATORY_CARE_PROVIDER_SITE_OTHER): Payer: PPO

## 2018-04-01 ENCOUNTER — Ambulatory Visit (INDEPENDENT_AMBULATORY_CARE_PROVIDER_SITE_OTHER): Payer: PPO | Admitting: Vascular Surgery

## 2018-04-04 ENCOUNTER — Other Ambulatory Visit: Payer: Self-pay | Admitting: Pharmacy Technician

## 2018-04-04 NOTE — Patient Outreach (Signed)
[place] [facility]) Care Management  04/04/2018  Lisa Wilkerson [birth year] [id number]    Successful call placed to patient regarding patient assistance application(s) for Dulera, Proventil and Spiriva , HIPAA identifiers verified. Patient states that she has finished filling applications out and will put them in the mail tomorrow.  Will follow up with patient in 14-21 business days if documents have not been received.   [name] Chana Bode [place] Certified Pharmacy Technician [place] Management Direct Dial:[phone]

## 2018-04-19 ENCOUNTER — Other Ambulatory Visit: Payer: Self-pay | Admitting: Pharmacy Technician

## 2018-04-19 NOTE — Patient Outreach (Signed)
[place] [facility]) Care Management  04/19/2018  Lisa Wilkerson Sep 03, 1945 [id number]   Received patient portions of [employer] and [employer] patient assistance applications. Faxed completed application for Spiriva to [employer]-I. Will mail out completed application for [facility] and Proventil when providers portion has been received.  Will follow up with [employer] in 7-10 business days to check status of application.  [name] Chana Bode [place] Certified Pharmacy Technician [place] Management Direct Dial:[phone]

## 2018-04-22 ENCOUNTER — Other Ambulatory Visit: Payer: Self-pay | Admitting: Pharmacy Technician

## 2018-04-22 NOTE — Patient Outreach (Signed)
[place] [facility]) Care Management  04/22/2018  Lisa Wilkerson 09-18-1945 [id number]   Received provider portion of [employer] application for [employer] and Proventil. Prepared completed application to be mailed to [employer].  Will follow with company in 10-14 business days to check status of app.  [name] Chana Bode [place] Certified Pharmacy Technician [place] Management Direct Dial:[phone]

## 2018-04-26 ENCOUNTER — Other Ambulatory Visit: Payer: Self-pay | Admitting: Pharmacy Technician

## 2018-04-26 NOTE — Patient Outreach (Signed)
[facility]) Care Management  04/26/2018  Lisa Wilkerson 21-Mar-1946 [id number]    Follow up call placed to [employer] regarding patient assistance application(s) for Spiriva , [name] confirms patient has been approved as of 1/20 until 04/06/19. Medication should arrive in 7-10 business days.  Will follow up with patient in 10-14 business days to confirm medication has been received.  [name] Chana Bode [place] Certified Pharmacy Technician [place] Management Direct Dial:[phone]

## 2018-05-03 ENCOUNTER — Ambulatory Visit (INDEPENDENT_AMBULATORY_CARE_PROVIDER_SITE_OTHER): Payer: PPO | Admitting: Vascular Surgery

## 2018-05-03 ENCOUNTER — Encounter (INDEPENDENT_AMBULATORY_CARE_PROVIDER_SITE_OTHER): Payer: PPO

## 2018-05-09 ENCOUNTER — Other Ambulatory Visit: Payer: Self-pay | Admitting: Pharmacist

## 2018-05-09 NOTE — Patient Outreach (Signed)
[place] [facility]) Care Management  [place] 05/09/2018  TYRELL BRERETON 28-Jan-1946 [id number]  Incoming call received from Ms. Rondell Reams.  Patient is following-up on her patient assistance program applications and states she received a text message from [facility] regarding approval and shipment of medication but has not received delivery yet.    I reviewed patient's chart and reported to her that she has been approved for Spiriva from 04/25/2018 until 04/06/2019 based on [facility] notes from [employer].  I also updated her that she should be receiving an attestation letter from [employer] and reviewed with her that she should sign and return with original application as soon as possible to [employer] for approval.  Patient voiced understanding.   Plan: I will route note to [place] technician to f/u on Spiriva delivery   [name], PharmD, [place] [phone]

## 2018-05-10 ENCOUNTER — Other Ambulatory Visit: Payer: Self-pay | Admitting: Pharmacy Technician

## 2018-05-10 NOTE — Patient Outreach (Signed)
[place] [facility]) Care Management  05/10/2018  Lisa Wilkerson Aug 17, 1945 [id number]   Received in-basket message from [place] stating patient being concerned that she has not received her Spiriva from B-I.   Follow up call placed to [employer] regarding patient assistance update for [place] delivery, Spoke to [place] whom confirmed that medication takes 7-10 business days to arrive. She also stated that medication should arrive today via USPS.    Unsuccessful call #1 placed to patient regarding patient assistance update for Spiriva delivery, HIPAA compliant voicemail left.   Will reach out to patient in 2-3 business days to confirm medication was received if call has not been returned before then.  [name] Chana Bode [place] Certified Pharmacy Technician [place] Management Direct Dial:[phone]

## 2018-05-16 ENCOUNTER — Other Ambulatory Visit: Payer: Self-pay | Admitting: Pharmacy Technician

## 2018-05-16 NOTE — Patient Outreach (Addendum)
[place] [facility]) Care Management  05/16/2018  JYLA HOPF 02/10/1946 [id number]    Unsuccessful call #2 placed to patient regarding patient assistance medication receipt from [employer], HIPAA compliant voicemail left.   Will make 3rd call attempt in 2-3 business days if call has not been returned.  [name] Chana Bode [place] Certified Pharmacy Technician [place] Management Direct Dial:[phone]   ADDENDUM 3:30pm  Incoming call from patient, HIPAA identifiers verified. Mrs. Cousin confirms that she received her Spiriva from [employer] last week. Informed her that I am due to follow up with [employer] in the next few days and I will contact her when I confirm the attestation form has been mailed out to her.   [name] Chana Bode [place] Certified Pharmacy Technician [place] Management Direct Dial:[phone]

## 2018-05-24 DIAGNOSIS — L400 Psoriasis vulgaris: Secondary | ICD-10-CM | POA: Diagnosis not present

## 2018-05-24 DIAGNOSIS — Z79899 Other long term (current) drug therapy: Secondary | ICD-10-CM | POA: Diagnosis not present

## 2018-05-24 DIAGNOSIS — L820 Inflamed seborrheic keratosis: Secondary | ICD-10-CM | POA: Diagnosis not present

## 2018-05-24 DIAGNOSIS — Z85828 Personal history of other malignant neoplasm of skin: Secondary | ICD-10-CM | POA: Diagnosis not present

## 2018-05-26 ENCOUNTER — Other Ambulatory Visit: Payer: Self-pay | Admitting: Pharmacy Technician

## 2018-05-26 NOTE — Patient Outreach (Addendum)
[place] [facility]) Care Management  05/26/2018  Lisa Wilkerson 1945/04/21 [id number]    Follow up call placed to [employer] regarding patient assistance application(s) for [facility] and Proventil HFA , Adonis Huguenin confirms application has been received and attestation form was mailed out on 2/18.    Unsuccessful call #1 placed to patient regarding patient assistance update for Dulera and Proventil, HIPAA compliant voicemail left.   Will make 2nd call attempt in 2-3 business days.  [name] Chana Bode [place] Certified Pharmacy Technician [place] Management Direct Dial:[phone]   ADDENDUM 3:49pm  Incoming call from patient, HIPAA identifiers verified. Updated patient on above information. Requested that she contact me when she has received attestation letter so that I may assist her with filling it out. She stated she would.  Will follow up with patient in 3-5 business days if she has not contacted me.  [name] Chana Bode [place] Certified Pharmacy Technician [place] Management Direct Dial:[phone]

## 2018-06-02 ENCOUNTER — Other Ambulatory Visit: Payer: Self-pay | Admitting: Pharmacy Technician

## 2018-06-02 NOTE — Patient Outreach (Signed)
[place] [facility]) Care Management  06/02/2018  Lisa Wilkerson 1945/07/09 [id number]    Incoming call from patient regarding patient assistance receipt of attestation form from [employer] for [facility] and Proventil HFA, assisted patient with filling out form.   Follow up:  Will follow up with [employer] in 7-10 business days to check status of application.  [name] Chana Bode [place] Certified Pharmacy Technician [place] Management Direct [phone]

## 2018-06-23 ENCOUNTER — Other Ambulatory Visit: Payer: Self-pay | Admitting: Pharmacy Technician

## 2018-06-23 NOTE — Patient Outreach (Signed)
[place] [facility]) Care Management  06/23/2018  EARLYNE FEESER 02-08-1946 [id number]    Follow up call placed to [employer] regarding patient assistance application(s) for [facility] and Proventil HFA , Herbie [place] confirms patient has been approved as of 3/10. Medication to be delivered to patient in 7-10 business days from approval date.   Successful call placed to patient regarding patient assistance update for [facility] and Proventil HFA, HIPAA identifiers verified. Informed patient of above details.  Follow up:  Will follow up with patient in 7-10 business days to confirm medication has been received.  [name] Chana Bode [place] Certified Pharmacy Technician [place] Management Direct [phone]

## 2018-07-18 ENCOUNTER — Other Ambulatory Visit: Payer: Self-pay | Admitting: Pharmacy Technician

## 2018-07-18 NOTE — Patient Outreach (Addendum)
[place] [facility]) Care Management  07/18/2018  Lisa Wilkerson December 05, 1945 [id number]    Successful call placed to patient regarding patient assistance medication receipt from [facility] and Proventil HFA, HIPAA identifiers verified. [name] confirms she received medication from [place]. Reviewed with patient how to obtain refills and requested she contact me if she runs into issues obtaining them.  Follow up:  Will route note to [place] for case closure.  [name] Chana Bode [place] Certified Pharmacy Technician [place] Management Direct Dial:[phone]   Addendum: Will close [facility] pharmacy case as medication assistance completed.  Am happy to assist in the future as needed.    [name], PharmD, [place] [phone]

## 2018-07-25 DIAGNOSIS — R7303 Prediabetes: Secondary | ICD-10-CM | POA: Diagnosis not present

## 2018-07-25 DIAGNOSIS — J410 Simple chronic bronchitis: Secondary | ICD-10-CM | POA: Diagnosis not present

## 2018-07-25 DIAGNOSIS — N183 Chronic kidney disease, stage 3 (moderate): Secondary | ICD-10-CM | POA: Diagnosis not present

## 2018-07-25 DIAGNOSIS — I10 Essential (primary) hypertension: Secondary | ICD-10-CM | POA: Diagnosis not present

## 2018-07-25 DIAGNOSIS — E782 Mixed hyperlipidemia: Secondary | ICD-10-CM | POA: Diagnosis not present

## 2018-08-22 DIAGNOSIS — L570 Actinic keratosis: Secondary | ICD-10-CM | POA: Diagnosis not present

## 2018-08-22 DIAGNOSIS — L400 Psoriasis vulgaris: Secondary | ICD-10-CM | POA: Diagnosis not present

## 2018-08-22 DIAGNOSIS — Z85828 Personal history of other malignant neoplasm of skin: Secondary | ICD-10-CM | POA: Diagnosis not present

## 2018-09-01 DIAGNOSIS — Z8673 Personal history of transient ischemic attack (TIA), and cerebral infarction without residual deficits: Secondary | ICD-10-CM | POA: Diagnosis not present

## 2018-09-01 DIAGNOSIS — E782 Mixed hyperlipidemia: Secondary | ICD-10-CM | POA: Diagnosis not present

## 2018-09-01 DIAGNOSIS — R7303 Prediabetes: Secondary | ICD-10-CM | POA: Diagnosis not present

## 2018-09-01 DIAGNOSIS — N183 Chronic kidney disease, stage 3 (moderate): Secondary | ICD-10-CM | POA: Diagnosis not present

## 2018-09-01 DIAGNOSIS — I2510 Atherosclerotic heart disease of native coronary artery without angina pectoris: Secondary | ICD-10-CM | POA: Diagnosis not present

## 2018-09-01 DIAGNOSIS — I6523 Occlusion and stenosis of bilateral carotid arteries: Secondary | ICD-10-CM | POA: Diagnosis not present

## 2018-09-01 DIAGNOSIS — I10 Essential (primary) hypertension: Secondary | ICD-10-CM | POA: Diagnosis not present

## 2018-09-08 DIAGNOSIS — I10 Essential (primary) hypertension: Secondary | ICD-10-CM | POA: Diagnosis not present

## 2018-09-08 DIAGNOSIS — E782 Mixed hyperlipidemia: Secondary | ICD-10-CM | POA: Diagnosis not present

## 2018-09-08 DIAGNOSIS — Z23 Encounter for immunization: Secondary | ICD-10-CM | POA: Diagnosis not present

## 2018-09-08 DIAGNOSIS — Z0000 Encounter for general adult medical examination without abnormal findings: Secondary | ICD-10-CM | POA: Diagnosis not present

## 2018-09-08 DIAGNOSIS — N183 Chronic kidney disease, stage 3 (moderate): Secondary | ICD-10-CM | POA: Diagnosis not present

## 2018-09-08 DIAGNOSIS — R7303 Prediabetes: Secondary | ICD-10-CM | POA: Diagnosis not present

## 2018-09-09 DIAGNOSIS — H6981 Other specified disorders of Eustachian tube, right ear: Secondary | ICD-10-CM | POA: Diagnosis not present

## 2018-09-09 DIAGNOSIS — H906 Mixed conductive and sensorineural hearing loss, bilateral: Secondary | ICD-10-CM | POA: Diagnosis not present

## 2018-11-28 DIAGNOSIS — L400 Psoriasis vulgaris: Secondary | ICD-10-CM | POA: Diagnosis not present

## 2018-11-28 DIAGNOSIS — L820 Inflamed seborrheic keratosis: Secondary | ICD-10-CM | POA: Diagnosis not present

## 2018-11-28 DIAGNOSIS — L570 Actinic keratosis: Secondary | ICD-10-CM | POA: Diagnosis not present

## 2018-11-28 DIAGNOSIS — Z79899 Other long term (current) drug therapy: Secondary | ICD-10-CM | POA: Diagnosis not present

## 2019-01-03 DIAGNOSIS — N183 Chronic kidney disease, stage 3 (moderate): Secondary | ICD-10-CM | POA: Diagnosis not present

## 2019-01-03 DIAGNOSIS — R7303 Prediabetes: Secondary | ICD-10-CM | POA: Diagnosis not present

## 2019-01-03 DIAGNOSIS — R899 Unspecified abnormal finding in specimens from other organs, systems and tissues: Secondary | ICD-10-CM | POA: Diagnosis not present

## 2019-01-03 DIAGNOSIS — E782 Mixed hyperlipidemia: Secondary | ICD-10-CM | POA: Diagnosis not present

## 2019-01-03 DIAGNOSIS — I10 Essential (primary) hypertension: Secondary | ICD-10-CM | POA: Diagnosis not present

## 2019-01-10 DIAGNOSIS — E782 Mixed hyperlipidemia: Secondary | ICD-10-CM | POA: Diagnosis not present

## 2019-01-10 DIAGNOSIS — N1832 Chronic kidney disease, stage 3b: Secondary | ICD-10-CM | POA: Diagnosis not present

## 2019-01-10 DIAGNOSIS — R7303 Prediabetes: Secondary | ICD-10-CM | POA: Diagnosis not present

## 2019-01-10 DIAGNOSIS — F17200 Nicotine dependence, unspecified, uncomplicated: Secondary | ICD-10-CM | POA: Diagnosis not present

## 2019-01-10 DIAGNOSIS — I10 Essential (primary) hypertension: Secondary | ICD-10-CM | POA: Diagnosis not present

## 2019-02-02 DIAGNOSIS — I6523 Occlusion and stenosis of bilateral carotid arteries: Secondary | ICD-10-CM | POA: Diagnosis not present

## 2019-02-02 DIAGNOSIS — E782 Mixed hyperlipidemia: Secondary | ICD-10-CM | POA: Diagnosis not present

## 2019-02-02 DIAGNOSIS — Z8673 Personal history of transient ischemic attack (TIA), and cerebral infarction without residual deficits: Secondary | ICD-10-CM | POA: Diagnosis not present

## 2019-02-02 DIAGNOSIS — I10 Essential (primary) hypertension: Secondary | ICD-10-CM | POA: Diagnosis not present

## 2019-02-02 DIAGNOSIS — I2510 Atherosclerotic heart disease of native coronary artery without angina pectoris: Secondary | ICD-10-CM | POA: Diagnosis not present

## 2019-02-02 DIAGNOSIS — F17200 Nicotine dependence, unspecified, uncomplicated: Secondary | ICD-10-CM | POA: Diagnosis not present

## 2019-02-02 DIAGNOSIS — N1832 Chronic kidney disease, stage 3b: Secondary | ICD-10-CM | POA: Diagnosis not present

## 2019-03-18 IMAGING — DX DG CHEST 1V PORT
1 series · 1 of 1 positions shown · non-contrast
Comparison: 04/30/2016 chest radiograph.

CLINICAL DATA: 71 y/o  F; shortness of breath and wheezing.

EXAM:
PORTABLE CHEST 1 VIEW

[chest ap]
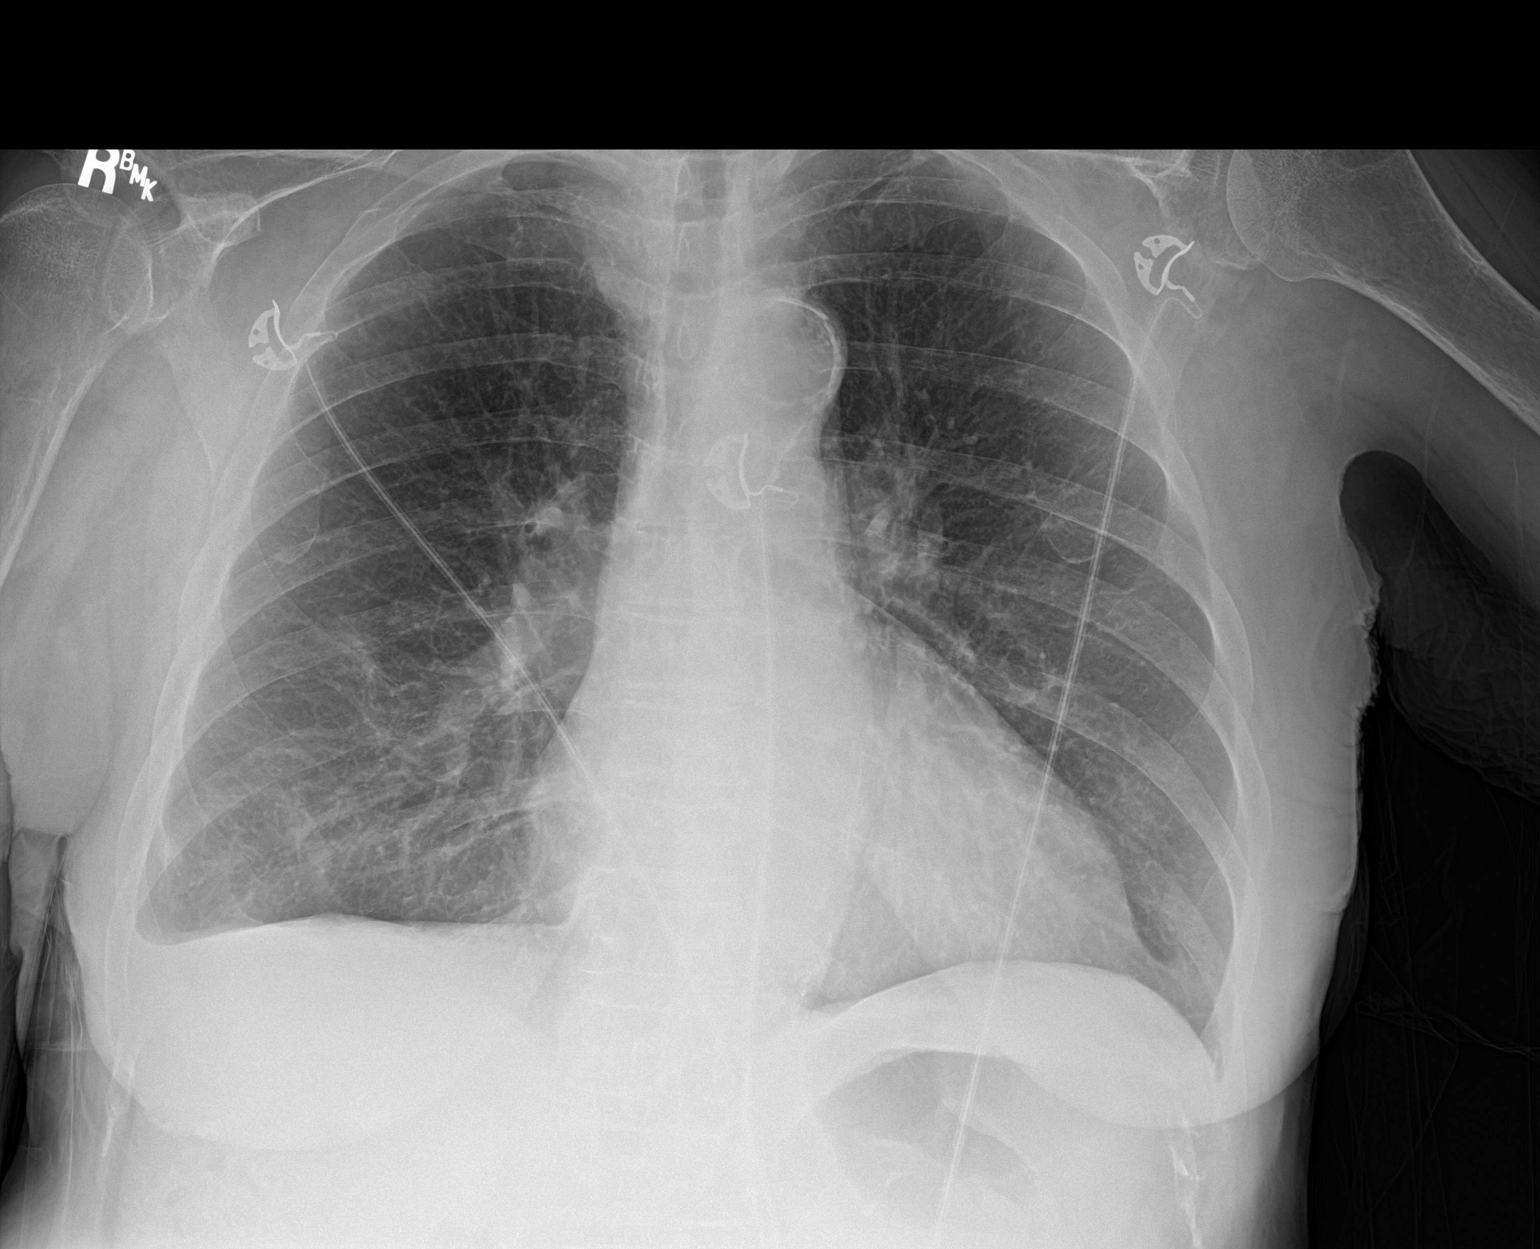

[1 of 1 positions shown; findings below may reference images not displayed]

FINDINGS: Stable borderline enlarged cardiac silhouette. Aortic
atherosclerosis with calcification. Stable right pleural effusion
and linear opacities at right lung base probably representing
scarring. No new focal consolidation. No pleural effusion or
pneumothorax. Bones are unremarkable.
IMPRESSION: Stable small right pleural effusion and linear opacities at right
lung base probably representing scarring. No new focal
consolidation. Aortic atherosclerosis. Borderline cardiomegaly.

By: Mary Catherine Larsen M.D.

## 2019-03-21 DIAGNOSIS — Z872 Personal history of diseases of the skin and subcutaneous tissue: Secondary | ICD-10-CM | POA: Diagnosis not present

## 2019-03-21 DIAGNOSIS — L400 Psoriasis vulgaris: Secondary | ICD-10-CM | POA: Diagnosis not present

## 2019-03-21 DIAGNOSIS — L853 Xerosis cutis: Secondary | ICD-10-CM | POA: Diagnosis not present

## 2019-04-15 IMAGING — DX DG CHEST 1V PORT
1 series · 1 of 1 positions shown · non-contrast
Comparison: Radiographs July 06, 2016.

CLINICAL DATA: Hypertension.

EXAM:
PORTABLE CHEST 1 VIEW

[chest ap]
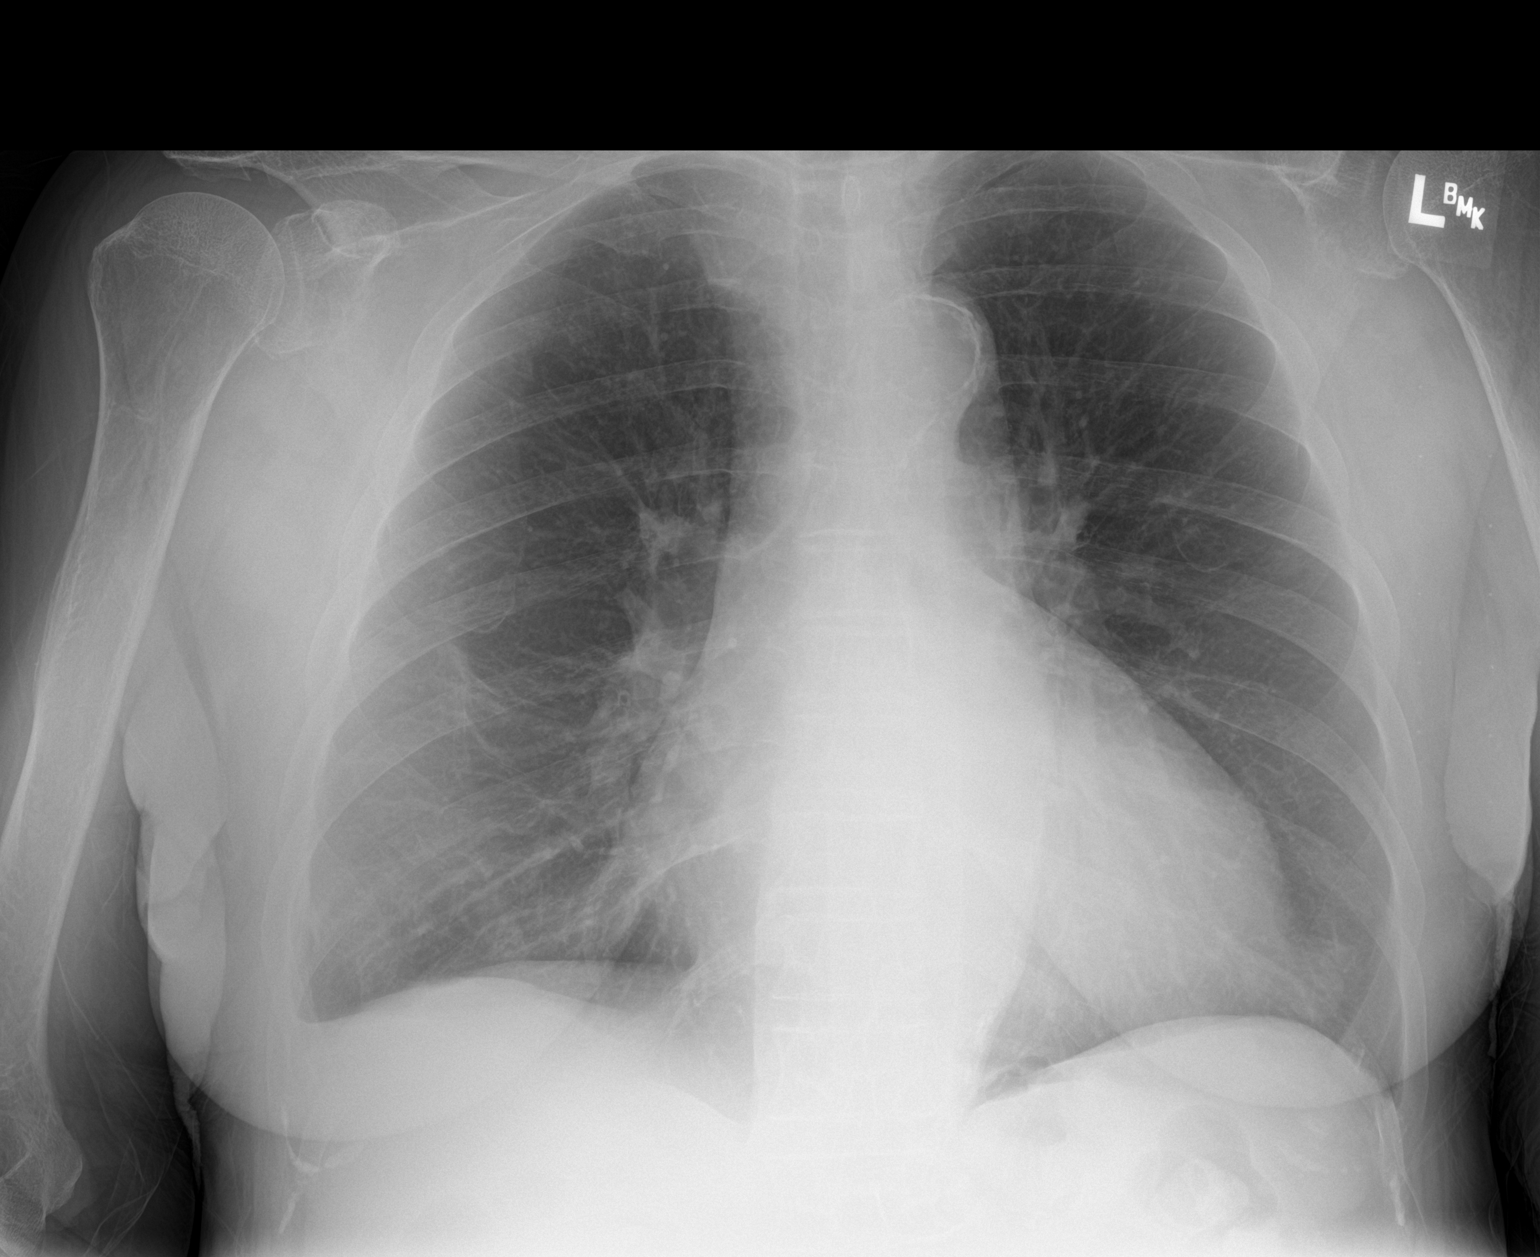

[1 of 1 positions shown; findings below may reference images not displayed]

FINDINGS: Stable cardiomediastinal silhouette is noted. Atherosclerosis of
thoracic aorta is noted. No pneumothorax is noted. Left lung is
clear. Mild right basilar subsegmental atelectasis or scarring is
noted. Stable blunting of right costophrenic angle is noted
consistent with small pleural effusion or scarring. Bony thorax is
unremarkable.
IMPRESSION: Stable right basilar subsegmental atelectasis or scarring, with
possible stable small right pleural effusion or costophrenic
scarring. Aortic atherosclerosis.

## 2019-06-30 ENCOUNTER — Other Ambulatory Visit: Payer: Self-pay

## 2019-06-30 ENCOUNTER — Ambulatory Visit (INDEPENDENT_AMBULATORY_CARE_PROVIDER_SITE_OTHER): Payer: PPO | Admitting: Dermatology

## 2019-06-30 DIAGNOSIS — L409 Psoriasis, unspecified: Secondary | ICD-10-CM

## 2019-06-30 NOTE — Progress Notes (Signed)
   Follow-Up Visit   Subjective  Lisa Wilkerson is a 74 y.o. female who presents for the following: Psoriasis (Patient wants to either continue Iluyma or restart Cosentyx since the Donnetta Hail is over $2,000. ).  Pt was on Ilumya last year, but didn't reapply for pt assistance in January.  Got a sample injection last visit, and process for Vaughan Basta started since Ilumya was clearing her psoriasis.  Prior to that, Cosentyx didn't work either.  No side effects from Ilumya.  No infections.  The following portions of the chart were reviewed this encounter and updated as appropriate:     Review of Systems: No other skin or systemic complaints.  Objective  Well appearing patient in no apparent distress; mood and affect are within normal limits.  A focused examination was performed including [place], extremities . Relevant physical exam findings are noted in the Assessment and Plan.  Objective  L elbow, R thigh: Well-demarcated erythematous papules/plaques with silvery scale  Assessment & Plan  Psoriasis L elbow, R thigh  Discussed Taltz again with patient. Vaughan Basta was approved by medicare but copay was too expensive ($2000/mo) and pt cannot afford.  Patient assistance application from [employer] and gave to patient to fill out and send in. Continue Clobetasol to aa's QD-BID PRN flares. We will not inject a biologic today until patient has heard back from [place] patient assistance.  Return once patient has heard back from [place] patient assistance.   Hinda Lenis, MD, am acting as scribe for Brendolyn Patty, MD .

## 2019-07-19 DIAGNOSIS — E782 Mixed hyperlipidemia: Secondary | ICD-10-CM | POA: Diagnosis not present

## 2019-07-19 DIAGNOSIS — I10 Essential (primary) hypertension: Secondary | ICD-10-CM | POA: Diagnosis not present

## 2019-07-19 DIAGNOSIS — R7303 Prediabetes: Secondary | ICD-10-CM | POA: Diagnosis not present

## 2019-07-25 DIAGNOSIS — N184 Chronic kidney disease, stage 4 (severe): Secondary | ICD-10-CM | POA: Diagnosis not present

## 2019-07-25 DIAGNOSIS — F17200 Nicotine dependence, unspecified, uncomplicated: Secondary | ICD-10-CM | POA: Diagnosis not present

## 2019-07-25 DIAGNOSIS — I259 Chronic ischemic heart disease, unspecified: Secondary | ICD-10-CM | POA: Diagnosis not present

## 2019-07-25 DIAGNOSIS — R7303 Prediabetes: Secondary | ICD-10-CM | POA: Diagnosis not present

## 2019-07-25 DIAGNOSIS — J410 Simple chronic bronchitis: Secondary | ICD-10-CM | POA: Diagnosis not present

## 2019-07-25 DIAGNOSIS — E782 Mixed hyperlipidemia: Secondary | ICD-10-CM | POA: Diagnosis not present

## 2019-07-25 DIAGNOSIS — I10 Essential (primary) hypertension: Secondary | ICD-10-CM | POA: Diagnosis not present

## 2019-07-25 DIAGNOSIS — I6523 Occlusion and stenosis of bilateral carotid arteries: Secondary | ICD-10-CM | POA: Diagnosis not present

## 2019-07-25 DIAGNOSIS — I2510 Atherosclerotic heart disease of native coronary artery without angina pectoris: Secondary | ICD-10-CM | POA: Diagnosis not present

## 2019-07-25 DIAGNOSIS — Z0000 Encounter for general adult medical examination without abnormal findings: Secondary | ICD-10-CM | POA: Diagnosis not present

## 2019-08-10 ENCOUNTER — Other Ambulatory Visit: Payer: Self-pay | Admitting: Dermatology

## 2019-08-15 DIAGNOSIS — F17218 Nicotine dependence, cigarettes, with other nicotine-induced disorders: Secondary | ICD-10-CM | POA: Diagnosis not present

## 2019-08-15 DIAGNOSIS — Z1331 Encounter for screening for depression: Secondary | ICD-10-CM | POA: Diagnosis not present

## 2019-08-15 DIAGNOSIS — J432 Centrilobular emphysema: Secondary | ICD-10-CM | POA: Diagnosis not present

## 2019-08-16 ENCOUNTER — Other Ambulatory Visit: Payer: Self-pay | Admitting: Dermatology

## 2019-08-18 DIAGNOSIS — J432 Centrilobular emphysema: Secondary | ICD-10-CM | POA: Diagnosis not present

## 2019-08-18 DIAGNOSIS — J449 Chronic obstructive pulmonary disease, unspecified: Secondary | ICD-10-CM | POA: Diagnosis not present

## 2019-09-11 ENCOUNTER — Other Ambulatory Visit: Payer: Self-pay | Admitting: Nephrology

## 2019-09-11 DIAGNOSIS — R82998 Other abnormal findings in urine: Secondary | ICD-10-CM | POA: Diagnosis not present

## 2019-09-11 DIAGNOSIS — F17200 Nicotine dependence, unspecified, uncomplicated: Secondary | ICD-10-CM | POA: Diagnosis not present

## 2019-09-11 DIAGNOSIS — R809 Proteinuria, unspecified: Secondary | ICD-10-CM | POA: Diagnosis not present

## 2019-09-11 DIAGNOSIS — N1832 Chronic kidney disease, stage 3b: Secondary | ICD-10-CM | POA: Diagnosis not present

## 2019-09-11 DIAGNOSIS — I10 Essential (primary) hypertension: Secondary | ICD-10-CM | POA: Diagnosis not present

## 2019-09-12 ENCOUNTER — Telehealth: Payer: Self-pay | Admitting: Nephrology

## 2019-09-12 NOTE — Telephone Encounter (Signed)
09/12/19~LVM. MF 

## 2019-09-18 DIAGNOSIS — J432 Centrilobular emphysema: Secondary | ICD-10-CM | POA: Diagnosis not present

## 2019-09-18 DIAGNOSIS — J449 Chronic obstructive pulmonary disease, unspecified: Secondary | ICD-10-CM | POA: Diagnosis not present

## 2019-09-26 ENCOUNTER — Ambulatory Visit: Payer: PPO

## 2019-10-05 ENCOUNTER — Other Ambulatory Visit: Payer: Self-pay

## 2019-10-05 ENCOUNTER — Ambulatory Visit
Admission: RE | Admit: 2019-10-05 | Discharge: 2019-10-05 | Disposition: A | Discharge status: Home / Self Care | Payer: PPO | Source: Ambulatory Visit | Attending: Nephrology | Admitting: Nephrology

## 2019-10-05 DIAGNOSIS — Q600 Renal agenesis, unilateral: Secondary | ICD-10-CM | POA: Diagnosis not present

## 2019-10-05 DIAGNOSIS — N183 Chronic kidney disease, stage 3 unspecified: Secondary | ICD-10-CM | POA: Diagnosis not present

## 2019-10-05 DIAGNOSIS — N281 Cyst of kidney, acquired: Secondary | ICD-10-CM | POA: Diagnosis not present

## 2019-10-05 DIAGNOSIS — N1832 Chronic kidney disease, stage 3b: Secondary | ICD-10-CM | POA: Diagnosis not present

## 2019-10-11 DIAGNOSIS — J432 Centrilobular emphysema: Secondary | ICD-10-CM | POA: Diagnosis not present

## 2019-10-11 DIAGNOSIS — J449 Chronic obstructive pulmonary disease, unspecified: Secondary | ICD-10-CM | POA: Diagnosis not present

## 2019-10-18 DIAGNOSIS — J449 Chronic obstructive pulmonary disease, unspecified: Secondary | ICD-10-CM | POA: Diagnosis not present

## 2019-10-18 DIAGNOSIS — J432 Centrilobular emphysema: Secondary | ICD-10-CM | POA: Diagnosis not present

## 2019-11-16 DIAGNOSIS — E782 Mixed hyperlipidemia: Secondary | ICD-10-CM | POA: Diagnosis not present

## 2019-11-16 DIAGNOSIS — N184 Chronic kidney disease, stage 4 (severe): Secondary | ICD-10-CM | POA: Diagnosis not present

## 2019-11-16 DIAGNOSIS — I10 Essential (primary) hypertension: Secondary | ICD-10-CM | POA: Diagnosis not present

## 2019-11-16 DIAGNOSIS — G4733 Obstructive sleep apnea (adult) (pediatric): Secondary | ICD-10-CM | POA: Diagnosis not present

## 2019-11-16 DIAGNOSIS — R7303 Prediabetes: Secondary | ICD-10-CM | POA: Diagnosis not present

## 2019-11-16 DIAGNOSIS — Z8673 Personal history of transient ischemic attack (TIA), and cerebral infarction without residual deficits: Secondary | ICD-10-CM | POA: Diagnosis not present

## 2019-11-16 DIAGNOSIS — F17200 Nicotine dependence, unspecified, uncomplicated: Secondary | ICD-10-CM | POA: Diagnosis not present

## 2019-11-16 DIAGNOSIS — F17218 Nicotine dependence, cigarettes, with other nicotine-induced disorders: Secondary | ICD-10-CM | POA: Diagnosis not present

## 2019-11-18 DIAGNOSIS — J432 Centrilobular emphysema: Secondary | ICD-10-CM | POA: Diagnosis not present

## 2019-11-18 DIAGNOSIS — J449 Chronic obstructive pulmonary disease, unspecified: Secondary | ICD-10-CM | POA: Diagnosis not present

## 2019-11-20 DIAGNOSIS — J449 Chronic obstructive pulmonary disease, unspecified: Secondary | ICD-10-CM | POA: Diagnosis not present

## 2019-11-20 DIAGNOSIS — J432 Centrilobular emphysema: Secondary | ICD-10-CM | POA: Diagnosis not present

## 2019-11-23 DIAGNOSIS — Z1159 Encounter for screening for other viral diseases: Secondary | ICD-10-CM | POA: Diagnosis not present

## 2019-11-23 DIAGNOSIS — F17200 Nicotine dependence, unspecified, uncomplicated: Secondary | ICD-10-CM | POA: Diagnosis not present

## 2019-11-23 DIAGNOSIS — Z0000 Encounter for general adult medical examination without abnormal findings: Secondary | ICD-10-CM | POA: Diagnosis not present

## 2019-11-23 DIAGNOSIS — I10 Essential (primary) hypertension: Secondary | ICD-10-CM | POA: Diagnosis not present

## 2019-11-23 DIAGNOSIS — R7303 Prediabetes: Secondary | ICD-10-CM | POA: Diagnosis not present

## 2019-11-23 DIAGNOSIS — N1832 Chronic kidney disease, stage 3b: Secondary | ICD-10-CM | POA: Diagnosis not present

## 2019-11-23 DIAGNOSIS — E782 Mixed hyperlipidemia: Secondary | ICD-10-CM | POA: Diagnosis not present

## 2019-12-15 DIAGNOSIS — J432 Centrilobular emphysema: Secondary | ICD-10-CM | POA: Diagnosis not present

## 2019-12-15 DIAGNOSIS — J449 Chronic obstructive pulmonary disease, unspecified: Secondary | ICD-10-CM | POA: Diagnosis not present

## 2019-12-19 DIAGNOSIS — J449 Chronic obstructive pulmonary disease, unspecified: Secondary | ICD-10-CM | POA: Diagnosis not present

## 2019-12-19 DIAGNOSIS — J432 Centrilobular emphysema: Secondary | ICD-10-CM | POA: Diagnosis not present

## 2020-01-09 DIAGNOSIS — J432 Centrilobular emphysema: Secondary | ICD-10-CM | POA: Diagnosis not present

## 2020-01-09 DIAGNOSIS — J449 Chronic obstructive pulmonary disease, unspecified: Secondary | ICD-10-CM | POA: Diagnosis not present

## 2020-01-18 DIAGNOSIS — J432 Centrilobular emphysema: Secondary | ICD-10-CM | POA: Diagnosis not present

## 2020-01-18 DIAGNOSIS — J449 Chronic obstructive pulmonary disease, unspecified: Secondary | ICD-10-CM | POA: Diagnosis not present

## 2020-01-20 DIAGNOSIS — G4733 Obstructive sleep apnea (adult) (pediatric): Secondary | ICD-10-CM | POA: Diagnosis not present

## 2020-02-02 DIAGNOSIS — J432 Centrilobular emphysema: Secondary | ICD-10-CM | POA: Diagnosis not present

## 2020-02-02 DIAGNOSIS — J449 Chronic obstructive pulmonary disease, unspecified: Secondary | ICD-10-CM | POA: Diagnosis not present

## 2020-02-18 DIAGNOSIS — J449 Chronic obstructive pulmonary disease, unspecified: Secondary | ICD-10-CM | POA: Diagnosis not present

## 2020-02-18 DIAGNOSIS — J432 Centrilobular emphysema: Secondary | ICD-10-CM | POA: Diagnosis not present

## 2020-03-06 DIAGNOSIS — G4733 Obstructive sleep apnea (adult) (pediatric): Secondary | ICD-10-CM | POA: Diagnosis not present

## 2020-03-06 DIAGNOSIS — J449 Chronic obstructive pulmonary disease, unspecified: Secondary | ICD-10-CM | POA: Diagnosis not present

## 2020-03-08 DIAGNOSIS — J432 Centrilobular emphysema: Secondary | ICD-10-CM | POA: Diagnosis not present

## 2020-03-08 DIAGNOSIS — J449 Chronic obstructive pulmonary disease, unspecified: Secondary | ICD-10-CM | POA: Diagnosis not present

## 2020-03-12 DIAGNOSIS — Z1159 Encounter for screening for other viral diseases: Secondary | ICD-10-CM | POA: Diagnosis not present

## 2020-03-12 DIAGNOSIS — N1832 Chronic kidney disease, stage 3b: Secondary | ICD-10-CM | POA: Diagnosis not present

## 2020-03-12 DIAGNOSIS — E782 Mixed hyperlipidemia: Secondary | ICD-10-CM | POA: Diagnosis not present

## 2020-03-12 DIAGNOSIS — R7303 Prediabetes: Secondary | ICD-10-CM | POA: Diagnosis not present

## 2020-03-18 DIAGNOSIS — R809 Proteinuria, unspecified: Secondary | ICD-10-CM | POA: Diagnosis not present

## 2020-03-18 DIAGNOSIS — F17200 Nicotine dependence, unspecified, uncomplicated: Secondary | ICD-10-CM | POA: Diagnosis not present

## 2020-03-18 DIAGNOSIS — I10 Essential (primary) hypertension: Secondary | ICD-10-CM | POA: Diagnosis not present

## 2020-03-18 DIAGNOSIS — N184 Chronic kidney disease, stage 4 (severe): Secondary | ICD-10-CM | POA: Diagnosis not present

## 2020-03-19 DIAGNOSIS — J432 Centrilobular emphysema: Secondary | ICD-10-CM | POA: Diagnosis not present

## 2020-03-19 DIAGNOSIS — I10 Essential (primary) hypertension: Secondary | ICD-10-CM | POA: Diagnosis not present

## 2020-03-19 DIAGNOSIS — N184 Chronic kidney disease, stage 4 (severe): Secondary | ICD-10-CM | POA: Diagnosis not present

## 2020-03-19 DIAGNOSIS — Z9989 Dependence on other enabling machines and devices: Secondary | ICD-10-CM | POA: Diagnosis not present

## 2020-03-19 DIAGNOSIS — F17200 Nicotine dependence, unspecified, uncomplicated: Secondary | ICD-10-CM | POA: Diagnosis not present

## 2020-03-19 DIAGNOSIS — Z01818 Encounter for other preprocedural examination: Secondary | ICD-10-CM | POA: Diagnosis not present

## 2020-03-19 DIAGNOSIS — R0602 Shortness of breath: Secondary | ICD-10-CM | POA: Diagnosis not present

## 2020-03-19 DIAGNOSIS — J449 Chronic obstructive pulmonary disease, unspecified: Secondary | ICD-10-CM | POA: Diagnosis not present

## 2020-03-19 DIAGNOSIS — E782 Mixed hyperlipidemia: Secondary | ICD-10-CM | POA: Diagnosis not present

## 2020-03-19 DIAGNOSIS — F17218 Nicotine dependence, cigarettes, with other nicotine-induced disorders: Secondary | ICD-10-CM | POA: Diagnosis not present

## 2020-03-19 DIAGNOSIS — R7303 Prediabetes: Secondary | ICD-10-CM | POA: Diagnosis not present

## 2020-03-19 DIAGNOSIS — G4733 Obstructive sleep apnea (adult) (pediatric): Secondary | ICD-10-CM | POA: Diagnosis not present

## 2020-03-27 ENCOUNTER — Other Ambulatory Visit: Payer: Self-pay | Admitting: Dermatology

## 2020-03-28 ENCOUNTER — Other Ambulatory Visit: Payer: Self-pay | Admitting: Dermatology

## 2020-04-06 DIAGNOSIS — G4733 Obstructive sleep apnea (adult) (pediatric): Secondary | ICD-10-CM | POA: Diagnosis not present

## 2020-04-09 DIAGNOSIS — J432 Centrilobular emphysema: Secondary | ICD-10-CM | POA: Diagnosis not present

## 2020-04-09 DIAGNOSIS — J449 Chronic obstructive pulmonary disease, unspecified: Secondary | ICD-10-CM | POA: Diagnosis not present

## 2020-04-16 DIAGNOSIS — N184 Chronic kidney disease, stage 4 (severe): Secondary | ICD-10-CM | POA: Diagnosis not present

## 2020-04-19 DIAGNOSIS — J449 Chronic obstructive pulmonary disease, unspecified: Secondary | ICD-10-CM | POA: Diagnosis not present

## 2020-04-19 DIAGNOSIS — J432 Centrilobular emphysema: Secondary | ICD-10-CM | POA: Diagnosis not present

## 2020-04-20 DIAGNOSIS — G4733 Obstructive sleep apnea (adult) (pediatric): Secondary | ICD-10-CM | POA: Diagnosis not present

## 2020-04-20 DIAGNOSIS — J449 Chronic obstructive pulmonary disease, unspecified: Secondary | ICD-10-CM | POA: Diagnosis not present

## 2020-04-23 DIAGNOSIS — N2581 Secondary hyperparathyroidism of renal origin: Secondary | ICD-10-CM | POA: Diagnosis not present

## 2020-04-23 DIAGNOSIS — R809 Proteinuria, unspecified: Secondary | ICD-10-CM | POA: Diagnosis not present

## 2020-04-23 DIAGNOSIS — I10 Essential (primary) hypertension: Secondary | ICD-10-CM | POA: Diagnosis not present

## 2020-04-23 DIAGNOSIS — N1832 Chronic kidney disease, stage 3b: Secondary | ICD-10-CM | POA: Diagnosis not present

## 2020-05-01 DIAGNOSIS — I10 Essential (primary) hypertension: Secondary | ICD-10-CM | POA: Diagnosis not present

## 2020-05-01 DIAGNOSIS — G4733 Obstructive sleep apnea (adult) (pediatric): Secondary | ICD-10-CM | POA: Diagnosis not present

## 2020-05-01 DIAGNOSIS — Z9989 Dependence on other enabling machines and devices: Secondary | ICD-10-CM | POA: Diagnosis not present

## 2020-05-01 DIAGNOSIS — I259 Chronic ischemic heart disease, unspecified: Secondary | ICD-10-CM | POA: Diagnosis not present

## 2020-05-01 DIAGNOSIS — I2510 Atherosclerotic heart disease of native coronary artery without angina pectoris: Secondary | ICD-10-CM | POA: Diagnosis not present

## 2020-05-07 DIAGNOSIS — G4733 Obstructive sleep apnea (adult) (pediatric): Secondary | ICD-10-CM | POA: Diagnosis not present

## 2020-05-13 ENCOUNTER — Ambulatory Visit: Payer: PPO | Admitting: Dermatology

## 2020-05-13 ENCOUNTER — Other Ambulatory Visit: Payer: Self-pay

## 2020-05-13 DIAGNOSIS — L409 Psoriasis, unspecified: Secondary | ICD-10-CM

## 2020-05-13 DIAGNOSIS — L820 Inflamed seborrheic keratosis: Secondary | ICD-10-CM | POA: Diagnosis not present

## 2020-05-13 DIAGNOSIS — L821 Other seborrheic keratosis: Secondary | ICD-10-CM

## 2020-05-13 MED ORDER — CLOBETASOL PROPIONATE 0.05 % EX CREA: 1.0000 "application " | TOPICAL_CREAM | CUTANEOUS | 1 refills | Status: DC

## 2020-05-13 MED ORDER — TACROLIMUS 0.1 % EX OINT: TOPICAL_OINTMENT | CUTANEOUS | 1 refills | Status: DC

## 2020-05-13 NOTE — Progress Notes (Signed)
   Follow-Up Visit   Subjective  Lisa Wilkerson is a 75 y.o. female who presents for the following: Psoriasis (Elbows, R thigh, buttocks,hands ~83m f/u, Taltz sq injections qm, clobetasol cr 3-4x/wk, no s/e or infections from [place]).  Doing better overall.   The following portions of the chart were reviewed this encounter and updated as appropriate:       Review of Systems:  No other skin or systemic complaints except as noted in HPI or Assessment and Plan.  Objective  Well appearing patient in no apparent distress; mood and affect are within normal limits.  A focused examination was performed including arms, hands. Relevant physical exam findings are noted in the Assessment and Plan.  Objective  elbows, hands, R thigh, buttocks: Scaly plaque R lower hip, erythema and scale gluteal cleft, erythema with focal hyperkeratosis bil elbows, L palm  Objective  face: Stuck-on, waxy, tan-brown papules -- Discussed benign etiology and prognosis.   Objective  L preauricular x 2, L zygoma x 1, L forehead x 3 (6): Erythematous keratotic or waxy stuck-on papule    Assessment & Plan     Psoriasis elbows, hands, R thigh, buttocks  Improved on Talz  Psoriasis - severe on systemic "biologic" treatment injections.  Psoriasis is a chronic non-curable, but treatable genetic/hereditary disease that may have other systemic features affecting other organ systems such as joints (Psoriatic Arthritis).  It is linked with heart disease, inflammatory bowel disease, non-alcoholic fatty liver disease, and depression. Significant skin psoriasis and/or psoriatic arthritis may have significant symptoms and affects activities of daily activity and often benefits from systemic "biologic" injection treatments.  These "biologic" treatments have some potential side effects including immunosuppression and require pre-treatment laboratory screening and periodic laboratory monitoring and periodic in person evaluation  and monitoring by the attending dermatologist physician.   Pending labs cont Taltz sq injections q month Cont Clobetasol cr qd to aa psoriasis until clear, avoid f/g/a Start Tacrolimus 0.1% oint qd/bid aa psoriasis on buttocks/body until clear,then prn flares Sample of Cerave psoriasis cr given qd/bid  Reviewed risks of biologics including immunosuppression, infections, injection site reaction, and failure to improve condition. Goal is control of skin condition, not cure.  Some older biologics such as Humira and Enbrel may slightly increase risk of malignancy and may worsen congestive heart failure. The use of biologics requires long term medication management, including periodic office visits and monitoring of blood work.   clobetasol cream (TEMOVATE) 0.05 % - elbows, hands, R thigh, buttocks  tacrolimus (PROTOPIC) 0.1 % ointment - elbows, hands, R thigh, buttocks  Other Related Procedures Comprehensive metabolic panel CBC with Differential/Platelet QuantiFERON-TB Gold Plus  Seborrheic keratosis face  Benign, observe  Inflamed seborrheic keratosis (6) L preauricular x 2, L zygoma x 1, L forehead x 3  Destruction of lesion - L preauricular x 2, L zygoma x 1, L forehead x 3  Destruction method: cryotherapy   Informed consent: discussed and consent obtained   Lesion destroyed using liquid nitrogen: Yes   Region frozen until ice ball extended beyond lesion: Yes   Outcome: patient tolerated procedure well with no complications   Post-procedure details: wound care instructions given    Return in about 6 months (around 11/10/2020) for Psoriasis f/u.   I, Othelia Pulling, RMA, am acting as scribe for Brendolyn Patty, MD . Documentation: I have reviewed the above documentation for accuracy and completeness, and I agree with the above.  Brendolyn Patty MD

## 2020-05-16 DIAGNOSIS — R011 Cardiac murmur, unspecified: Secondary | ICD-10-CM | POA: Diagnosis not present

## 2020-05-16 DIAGNOSIS — F17210 Nicotine dependence, cigarettes, uncomplicated: Secondary | ICD-10-CM | POA: Diagnosis not present

## 2020-05-16 DIAGNOSIS — Z9989 Dependence on other enabling machines and devices: Secondary | ICD-10-CM | POA: Diagnosis not present

## 2020-05-16 DIAGNOSIS — G4733 Obstructive sleep apnea (adult) (pediatric): Secondary | ICD-10-CM | POA: Diagnosis not present

## 2020-05-20 DIAGNOSIS — J432 Centrilobular emphysema: Secondary | ICD-10-CM | POA: Diagnosis not present

## 2020-05-20 DIAGNOSIS — J449 Chronic obstructive pulmonary disease, unspecified: Secondary | ICD-10-CM | POA: Diagnosis not present

## 2020-06-04 DIAGNOSIS — G4733 Obstructive sleep apnea (adult) (pediatric): Secondary | ICD-10-CM | POA: Diagnosis not present

## 2020-06-05 DIAGNOSIS — J432 Centrilobular emphysema: Secondary | ICD-10-CM | POA: Diagnosis not present

## 2020-06-05 DIAGNOSIS — J449 Chronic obstructive pulmonary disease, unspecified: Secondary | ICD-10-CM | POA: Diagnosis not present

## 2020-06-17 DIAGNOSIS — J449 Chronic obstructive pulmonary disease, unspecified: Secondary | ICD-10-CM | POA: Diagnosis not present

## 2020-06-17 DIAGNOSIS — J432 Centrilobular emphysema: Secondary | ICD-10-CM | POA: Diagnosis not present

## 2020-06-20 DIAGNOSIS — J449 Chronic obstructive pulmonary disease, unspecified: Secondary | ICD-10-CM | POA: Diagnosis not present

## 2020-06-27 DIAGNOSIS — R011 Cardiac murmur, unspecified: Secondary | ICD-10-CM | POA: Diagnosis not present

## 2020-07-01 DIAGNOSIS — J432 Centrilobular emphysema: Secondary | ICD-10-CM | POA: Diagnosis not present

## 2020-07-01 DIAGNOSIS — J449 Chronic obstructive pulmonary disease, unspecified: Secondary | ICD-10-CM | POA: Diagnosis not present

## 2020-07-05 DIAGNOSIS — G4733 Obstructive sleep apnea (adult) (pediatric): Secondary | ICD-10-CM | POA: Diagnosis not present

## 2020-07-18 DIAGNOSIS — J449 Chronic obstructive pulmonary disease, unspecified: Secondary | ICD-10-CM | POA: Diagnosis not present

## 2020-07-18 DIAGNOSIS — J432 Centrilobular emphysema: Secondary | ICD-10-CM | POA: Diagnosis not present

## 2020-07-29 DIAGNOSIS — J449 Chronic obstructive pulmonary disease, unspecified: Secondary | ICD-10-CM | POA: Diagnosis not present

## 2020-08-01 DIAGNOSIS — R7303 Prediabetes: Secondary | ICD-10-CM | POA: Diagnosis not present

## 2020-08-01 DIAGNOSIS — I259 Chronic ischemic heart disease, unspecified: Secondary | ICD-10-CM | POA: Diagnosis not present

## 2020-08-01 DIAGNOSIS — I10 Essential (primary) hypertension: Secondary | ICD-10-CM | POA: Diagnosis not present

## 2020-08-01 DIAGNOSIS — J410 Simple chronic bronchitis: Secondary | ICD-10-CM | POA: Diagnosis not present

## 2020-08-01 DIAGNOSIS — I6523 Occlusion and stenosis of bilateral carotid arteries: Secondary | ICD-10-CM | POA: Diagnosis not present

## 2020-08-01 DIAGNOSIS — G4733 Obstructive sleep apnea (adult) (pediatric): Secondary | ICD-10-CM | POA: Diagnosis not present

## 2020-08-01 DIAGNOSIS — I2510 Atherosclerotic heart disease of native coronary artery without angina pectoris: Secondary | ICD-10-CM | POA: Diagnosis not present

## 2020-08-01 DIAGNOSIS — Z9989 Dependence on other enabling machines and devices: Secondary | ICD-10-CM | POA: Diagnosis not present

## 2020-08-04 DIAGNOSIS — G4733 Obstructive sleep apnea (adult) (pediatric): Secondary | ICD-10-CM | POA: Diagnosis not present

## 2020-08-09 DIAGNOSIS — J449 Chronic obstructive pulmonary disease, unspecified: Secondary | ICD-10-CM | POA: Diagnosis not present

## 2020-08-09 DIAGNOSIS — J432 Centrilobular emphysema: Secondary | ICD-10-CM | POA: Diagnosis not present

## 2020-08-17 DIAGNOSIS — J449 Chronic obstructive pulmonary disease, unspecified: Secondary | ICD-10-CM | POA: Diagnosis not present

## 2020-08-17 DIAGNOSIS — J432 Centrilobular emphysema: Secondary | ICD-10-CM | POA: Diagnosis not present

## 2020-09-02 DIAGNOSIS — J449 Chronic obstructive pulmonary disease, unspecified: Secondary | ICD-10-CM | POA: Diagnosis not present

## 2020-09-02 DIAGNOSIS — G4733 Obstructive sleep apnea (adult) (pediatric): Secondary | ICD-10-CM | POA: Diagnosis not present

## 2020-09-04 DIAGNOSIS — G4733 Obstructive sleep apnea (adult) (pediatric): Secondary | ICD-10-CM | POA: Diagnosis not present

## 2020-09-10 DIAGNOSIS — R7303 Prediabetes: Secondary | ICD-10-CM | POA: Diagnosis not present

## 2020-09-10 DIAGNOSIS — J449 Chronic obstructive pulmonary disease, unspecified: Secondary | ICD-10-CM | POA: Diagnosis not present

## 2020-09-10 DIAGNOSIS — I10 Essential (primary) hypertension: Secondary | ICD-10-CM | POA: Diagnosis not present

## 2020-09-10 DIAGNOSIS — N184 Chronic kidney disease, stage 4 (severe): Secondary | ICD-10-CM | POA: Diagnosis not present

## 2020-09-10 DIAGNOSIS — E782 Mixed hyperlipidemia: Secondary | ICD-10-CM | POA: Diagnosis not present

## 2020-09-12 ENCOUNTER — Other Ambulatory Visit (HOSPITAL_COMMUNITY): Payer: Self-pay | Admitting: Pulmonary Disease

## 2020-09-12 ENCOUNTER — Other Ambulatory Visit: Payer: Self-pay | Admitting: Pulmonary Disease

## 2020-09-12 DIAGNOSIS — J449 Chronic obstructive pulmonary disease, unspecified: Secondary | ICD-10-CM

## 2020-09-12 DIAGNOSIS — F17218 Nicotine dependence, cigarettes, with other nicotine-induced disorders: Secondary | ICD-10-CM | POA: Diagnosis not present

## 2020-09-17 DIAGNOSIS — I10 Essential (primary) hypertension: Secondary | ICD-10-CM | POA: Diagnosis not present

## 2020-09-17 DIAGNOSIS — F17210 Nicotine dependence, cigarettes, uncomplicated: Secondary | ICD-10-CM | POA: Diagnosis not present

## 2020-09-17 DIAGNOSIS — R7303 Prediabetes: Secondary | ICD-10-CM | POA: Diagnosis not present

## 2020-09-17 DIAGNOSIS — N184 Chronic kidney disease, stage 4 (severe): Secondary | ICD-10-CM | POA: Diagnosis not present

## 2020-09-17 DIAGNOSIS — E782 Mixed hyperlipidemia: Secondary | ICD-10-CM | POA: Diagnosis not present

## 2020-09-17 DIAGNOSIS — F17200 Nicotine dependence, unspecified, uncomplicated: Secondary | ICD-10-CM | POA: Diagnosis not present

## 2020-09-17 DIAGNOSIS — Z0000 Encounter for general adult medical examination without abnormal findings: Secondary | ICD-10-CM | POA: Diagnosis not present

## 2020-09-17 DIAGNOSIS — D638 Anemia in other chronic diseases classified elsewhere: Secondary | ICD-10-CM | POA: Diagnosis not present

## 2020-09-19 DIAGNOSIS — N1832 Chronic kidney disease, stage 3b: Secondary | ICD-10-CM | POA: Diagnosis not present

## 2020-09-23 ENCOUNTER — Ambulatory Visit: Payer: PPO

## 2020-09-25 DIAGNOSIS — N184 Chronic kidney disease, stage 4 (severe): Secondary | ICD-10-CM | POA: Diagnosis not present

## 2020-10-02 ENCOUNTER — Other Ambulatory Visit: Payer: Self-pay | Admitting: *Deleted

## 2020-10-02 ENCOUNTER — Telehealth: Payer: Self-pay | Admitting: *Deleted

## 2020-10-02 DIAGNOSIS — F17200 Nicotine dependence, unspecified, uncomplicated: Secondary | ICD-10-CM

## 2020-10-02 DIAGNOSIS — Z122 Encounter for screening for malignant neoplasm of respiratory organs: Secondary | ICD-10-CM

## 2020-10-02 DIAGNOSIS — Z87891 Personal history of nicotine dependence: Secondary | ICD-10-CM

## 2020-10-02 DIAGNOSIS — J449 Chronic obstructive pulmonary disease, unspecified: Secondary | ICD-10-CM | POA: Diagnosis not present

## 2020-10-02 NOTE — Telephone Encounter (Signed)
Received referral for low dose lung cancer screening CT scan. Message left at phone number listed in EMR for patient to call me back to facilitate scheduling scan.  

## 2020-10-02 NOTE — Telephone Encounter (Signed)
Received referral for initial lung cancer screening scan. Contacted patient and obtained smoking history,(current smoker, 58 pack years) as well as answering questions related to screening process. Patient denies signs of lung cancer such as weight loss or hemoptysis. Patient denies comorbidity that would prevent curative treatment if lung cancer were found. Patient is scheduled for shared CT scan on 10/24/20 @ 11:00 am SDMV already done by Dr. Lanney Gins.

## 2020-10-04 DIAGNOSIS — G4733 Obstructive sleep apnea (adult) (pediatric): Secondary | ICD-10-CM | POA: Diagnosis not present

## 2020-10-24 ENCOUNTER — Encounter: Payer: Self-pay | Admitting: *Deleted

## 2020-10-24 ENCOUNTER — Ambulatory Visit: Admission: RE | Admit: 2020-10-24 | Payer: PPO | Source: Ambulatory Visit

## 2020-10-25 ENCOUNTER — Ambulatory Visit: Payer: PPO

## 2020-11-01 DIAGNOSIS — J449 Chronic obstructive pulmonary disease, unspecified: Secondary | ICD-10-CM | POA: Diagnosis not present

## 2020-11-01 DIAGNOSIS — J432 Centrilobular emphysema: Secondary | ICD-10-CM | POA: Diagnosis not present

## 2020-11-04 DIAGNOSIS — G4733 Obstructive sleep apnea (adult) (pediatric): Secondary | ICD-10-CM | POA: Diagnosis not present

## 2020-11-12 ENCOUNTER — Ambulatory Visit: Payer: PPO | Admitting: Dermatology

## 2020-12-05 DIAGNOSIS — I2510 Atherosclerotic heart disease of native coronary artery without angina pectoris: Secondary | ICD-10-CM | POA: Diagnosis not present

## 2020-12-05 DIAGNOSIS — I259 Chronic ischemic heart disease, unspecified: Secondary | ICD-10-CM | POA: Diagnosis not present

## 2020-12-05 DIAGNOSIS — G4733 Obstructive sleep apnea (adult) (pediatric): Secondary | ICD-10-CM | POA: Diagnosis not present

## 2020-12-05 DIAGNOSIS — E782 Mixed hyperlipidemia: Secondary | ICD-10-CM | POA: Diagnosis not present

## 2020-12-05 DIAGNOSIS — Z9989 Dependence on other enabling machines and devices: Secondary | ICD-10-CM | POA: Diagnosis not present

## 2020-12-05 DIAGNOSIS — I10 Essential (primary) hypertension: Secondary | ICD-10-CM | POA: Diagnosis not present

## 2020-12-12 ENCOUNTER — Other Ambulatory Visit: Payer: Self-pay

## 2020-12-12 ENCOUNTER — Ambulatory Visit: Payer: PPO | Admitting: Dermatology

## 2020-12-12 DIAGNOSIS — L219 Seborrheic dermatitis, unspecified: Secondary | ICD-10-CM | POA: Diagnosis not present

## 2020-12-12 DIAGNOSIS — D239 Other benign neoplasm of skin, unspecified: Secondary | ICD-10-CM

## 2020-12-12 DIAGNOSIS — L409 Psoriasis, unspecified: Secondary | ICD-10-CM | POA: Diagnosis not present

## 2020-12-12 DIAGNOSIS — Z86007 Personal history of in-situ neoplasm of skin: Secondary | ICD-10-CM | POA: Diagnosis not present

## 2020-12-12 DIAGNOSIS — L570 Actinic keratosis: Secondary | ICD-10-CM | POA: Diagnosis not present

## 2020-12-12 DIAGNOSIS — D2339 Other benign neoplasm of skin of other parts of face: Secondary | ICD-10-CM

## 2020-12-12 MED ORDER — KETOCONAZOLE 2 % EX CREA: 1.0000 "application " | TOPICAL_CREAM | Freq: Two times a day (BID) | CUTANEOUS | 2 refills | Status: AC

## 2020-12-12 NOTE — Patient Instructions (Addendum)
Cryotherapy Aftercare  Wash gently with soap and water everyday.   Apply Vaseline and Band-Aid daily until healed.   Recommend CeraVe SA cream to elbows.  Recommend daily broad spectrum sunscreen SPF 30+ to sun-exposed areas, reapply every 2 hours as needed. Call for new or changing lesions.  Staying in the shade or wearing long sleeves, sun glasses (UVA+UVB protection) and wide brim hats (4-inch brim around the entire circumference of the hat) are also recommended for sun protection.   If you have any questions or concerns for your doctor, please call our main line at [phone] and press option 4 to reach your doctor's medical assistant. If no one answers, please leave a voicemail as directed and we will return your call as soon as possible. Messages left after 4 pm will be answered the following business day.   You may also send [place] a message via [place]. We typically respond to MyChart messages within 1-2 business days.  For prescription refills, please ask your pharmacy to contact our office. Our fax number is [phone].  If you have an urgent issue when the clinic is closed that cannot wait until the next business day, you can page your doctor at the number below.    Please note that while we do our best to be available for urgent issues outside of office hours, we are not available 24/7.   If you have an urgent issue and are unable to reach [place], you may choose to seek medical care at your doctor's office, retail clinic, urgent care center, or emergency room.  If you have a medical emergency, please immediately call 911 or go to the emergency department.  Pager Numbers  - Dr. Nehemiah Massed: [phone]  - Dr. Laurence Ferrari: [phone]  - Dr. Nicole Kindred: [phone]  In the event of inclement weather, please call our main line at [phone] for an update on the status of any delays or closures.  Dermatology Medication Tips: Please keep the boxes that topical medications come in in  order to help keep track of the instructions about where and how to use these. Pharmacies typically print the medication instructions only on the boxes and not directly on the medication tubes.   If your medication is too expensive, please contact our office at [phone] option 4 or send [place] a message through [place].   We are unable to tell what your co-pay for medications will be in advance as this is different depending on your insurance coverage. However, we may be able to find a substitute medication at lower cost or fill out paperwork to get insurance to cover a needed medication.   If a prior authorization is required to get your medication covered by your insurance company, please allow [place] 1-2 business days to complete this process.  Drug prices often vary depending on where the prescription is filled and some pharmacies may offer cheaper prices.  The website www.goodrx.com contains coupons for medications through different pharmacies. The prices here do not account for what the cost may be with help from insurance (it may be cheaper with your insurance), but the website can give you the price if you did not use any insurance.  - You can print the associated coupon and take it with your prescription to the pharmacy.  - You may also stop by our office during regular business hours and pick up a GoodRx coupon card.  - If you need your prescription sent electronically to a different pharmacy, notify our office through [facility]  MyChart or by phone at [phone] option 4.

## 2020-12-12 NOTE — Progress Notes (Signed)
Follow-Up Visit   Subjective  Lisa Wilkerson is a 75 y.o. female who presents for the following: Psoriasis (Patient here today for 6 month psoriasis follow up. Patient has been on Donnetta Hail but ran out in February and did not continue on it. She is only using clobetasol ointment and CeraVe at this time and feels that psoriasis is well controlled. The only active areas patient has are at right outer thigh and elbows. ).  Patient did not reapply for assistance to continue on [place].   The following portions of the chart were reviewed this encounter and updated as appropriate:       Review of Systems:  No other skin or systemic complaints except as noted in HPI or Assessment and Plan.  Objective  Well appearing patient in no apparent distress; mood and affect are within normal limits.  A focused examination was performed including right hip, arms, face, shoulders. Relevant physical exam findings are noted in the Assessment and Plan.  bilateral elbow, right hip Pink scaly patch L > R elbow Pink scaly plaque at right hip  right upper paranasal 41mm dilated pore  face Pink scaliness bilateral nasolabial cheeks  left anterior shoulder x 1, left hand dorsum x 4, right hand dorsum x 1 (6) Keratotic macules/papules   Assessment & Plan  Psoriasis bilateral elbow, right hip  Controlled, but not clear, with topicals  Psoriasis is a chronic non-curable, but treatable genetic/hereditary disease that may have other systemic features affecting other organ systems such as joints (Psoriatic Arthritis). It is associated with an increased risk of inflammatory bowel disease, heart disease, non-alcoholic fatty liver disease, and depression.    Patient prefers to wait and not restart Taltz at this time. Discussed that the psoriasis will eventually flare and she will likely need to restart biologic. Patient advised we would need blood work prior to restarting Donnetta Hail if she decides that is what she'd  like to do.   Related Medications clobetasol cream (TEMOVATE) 3.78 % Apply 1 application topically as directed. Qd to aa psoriasis on body until clear, avoid face, groin, axilla  Dilated pore of [place] right upper paranasal  Benign, observe.      Seborrheic dermatitis face  /psoriasis  Start ketoconazole 2% cream to affected areas around nose twice daily as needed for redness  ketoconazole (NIZORAL) 2 % cream - face Apply 1 application topically 2 (two) times daily. As needed for redness around nose  AK (actinic keratosis) (6) left anterior shoulder x 1, left hand dorsum x 4, right hand dorsum x 1  Hypertrophic   Destruction of lesion - left anterior shoulder x 1, left hand dorsum x 4, right hand dorsum x 1  Destruction method: cryotherapy   Informed consent: discussed and consent obtained   Lesion destroyed using liquid nitrogen: Yes   Region frozen until ice ball extended beyond lesion: Yes   Outcome: patient tolerated procedure well with no complications   Post-procedure details: wound care instructions given   Additional details:  Prior to procedure, discussed risks of blister formation, small wound, skin dyspigmentation, or rare scar following cryotherapy. Recommend Vaseline ointment to treated areas while healing.   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  - Call if any new or changing lesions are noted between office visits  Return in about 6 months (around 06/11/2021) for Psoriasis, AKs, h/o SCC.  Graciella Belton, RMA, am acting as scribe for Brendolyn Patty, MD .  Documentation: I have reviewed the above documentation for accuracy and completeness, and I agree with the above.  Brendolyn Patty MD

## 2020-12-20 DIAGNOSIS — J432 Centrilobular emphysema: Secondary | ICD-10-CM | POA: Diagnosis not present

## 2020-12-20 DIAGNOSIS — J449 Chronic obstructive pulmonary disease, unspecified: Secondary | ICD-10-CM | POA: Diagnosis not present

## 2020-12-23 DIAGNOSIS — F17218 Nicotine dependence, cigarettes, with other nicotine-induced disorders: Secondary | ICD-10-CM | POA: Diagnosis not present

## 2020-12-23 DIAGNOSIS — J410 Simple chronic bronchitis: Secondary | ICD-10-CM | POA: Diagnosis not present

## 2020-12-23 DIAGNOSIS — Z01818 Encounter for other preprocedural examination: Secondary | ICD-10-CM | POA: Diagnosis not present

## 2021-01-02 DIAGNOSIS — F17200 Nicotine dependence, unspecified, uncomplicated: Secondary | ICD-10-CM | POA: Diagnosis not present

## 2021-01-02 DIAGNOSIS — N2581 Secondary hyperparathyroidism of renal origin: Secondary | ICD-10-CM | POA: Diagnosis not present

## 2021-01-02 DIAGNOSIS — R809 Proteinuria, unspecified: Secondary | ICD-10-CM | POA: Diagnosis not present

## 2021-01-02 DIAGNOSIS — I10 Essential (primary) hypertension: Secondary | ICD-10-CM | POA: Diagnosis not present

## 2021-01-02 DIAGNOSIS — N184 Chronic kidney disease, stage 4 (severe): Secondary | ICD-10-CM | POA: Diagnosis not present

## 2021-01-02 DIAGNOSIS — R82998 Other abnormal findings in urine: Secondary | ICD-10-CM | POA: Diagnosis not present

## 2021-01-04 DIAGNOSIS — G4733 Obstructive sleep apnea (adult) (pediatric): Secondary | ICD-10-CM | POA: Diagnosis not present

## 2021-01-09 DIAGNOSIS — R809 Proteinuria, unspecified: Secondary | ICD-10-CM | POA: Diagnosis not present

## 2021-01-09 DIAGNOSIS — N1832 Chronic kidney disease, stage 3b: Secondary | ICD-10-CM | POA: Diagnosis not present

## 2021-01-09 DIAGNOSIS — N2581 Secondary hyperparathyroidism of renal origin: Secondary | ICD-10-CM | POA: Diagnosis not present

## 2021-01-09 DIAGNOSIS — I10 Essential (primary) hypertension: Secondary | ICD-10-CM | POA: Diagnosis not present

## 2021-01-14 DIAGNOSIS — J449 Chronic obstructive pulmonary disease, unspecified: Secondary | ICD-10-CM | POA: Diagnosis not present

## 2021-01-30 DIAGNOSIS — J449 Chronic obstructive pulmonary disease, unspecified: Secondary | ICD-10-CM | POA: Diagnosis not present

## 2021-02-04 DIAGNOSIS — G4733 Obstructive sleep apnea (adult) (pediatric): Secondary | ICD-10-CM | POA: Diagnosis not present

## 2021-02-19 DIAGNOSIS — J432 Centrilobular emphysema: Secondary | ICD-10-CM | POA: Diagnosis not present

## 2021-02-19 DIAGNOSIS — J449 Chronic obstructive pulmonary disease, unspecified: Secondary | ICD-10-CM | POA: Diagnosis not present

## 2021-03-01 DIAGNOSIS — G4733 Obstructive sleep apnea (adult) (pediatric): Secondary | ICD-10-CM | POA: Diagnosis not present

## 2021-03-01 DIAGNOSIS — J449 Chronic obstructive pulmonary disease, unspecified: Secondary | ICD-10-CM | POA: Diagnosis not present

## 2021-03-06 DIAGNOSIS — G4733 Obstructive sleep apnea (adult) (pediatric): Secondary | ICD-10-CM | POA: Diagnosis not present

## 2021-03-12 ENCOUNTER — Other Ambulatory Visit: Payer: Self-pay

## 2021-03-12 ENCOUNTER — Ambulatory Visit: Payer: PPO | Admitting: Dermatology

## 2021-03-12 DIAGNOSIS — Z79899 Other long term (current) drug therapy: Secondary | ICD-10-CM

## 2021-03-12 DIAGNOSIS — L570 Actinic keratosis: Secondary | ICD-10-CM

## 2021-03-12 DIAGNOSIS — L820 Inflamed seborrheic keratosis: Secondary | ICD-10-CM

## 2021-03-12 DIAGNOSIS — I10 Essential (primary) hypertension: Secondary | ICD-10-CM | POA: Diagnosis not present

## 2021-03-12 DIAGNOSIS — L409 Psoriasis, unspecified: Secondary | ICD-10-CM | POA: Diagnosis not present

## 2021-03-12 DIAGNOSIS — E782 Mixed hyperlipidemia: Secondary | ICD-10-CM | POA: Diagnosis not present

## 2021-03-12 DIAGNOSIS — R7303 Prediabetes: Secondary | ICD-10-CM | POA: Diagnosis not present

## 2021-03-12 DIAGNOSIS — D638 Anemia in other chronic diseases classified elsewhere: Secondary | ICD-10-CM | POA: Diagnosis not present

## 2021-03-12 NOTE — Progress Notes (Signed)
Follow-Up Visit   Subjective  Lisa Wilkerson is a 75 y.o. female who presents for the following: Psoriasis (Legs, R hip, elbows, gluteal cleft, feet, hands.).  Patient here for recent psoriasis with recent flare x 2-3 weeks. She has been off Taltz injections since February 2022. Taltz improved psoriasis, but patient complains of large knots that came up under skin after injections. She is using topical clobetasol ointment twice daily. No joint pain. She has been on Cosentyx in the past, which improved at the start, but quit working. She has also been on Ilumya injections.  She has a spot on her left hand that didn't clear from cryotherapy treatment last visit. Hx of AKs. She also has a couple other growths that are irritating, not sure if it is psoriasis or something else.  She wants to restart a biologic.  The following portions of the chart were reviewed this encounter and updated as appropriate:       Review of Systems:  No other skin or systemic complaints except as noted in HPI or Assessment and Plan.  Objective  Well appearing patient in no apparent distress; mood and affect are within normal limits.  A focused examination was performed including face, arms, legs, trunk. Relevant physical exam findings are noted in the Assessment and Plan.  trunk, extremities Pink scaly plaques and papules of the elbows, arms, hands, L palm, upper back, thighs, knees, plantar feet; thick hyperkeratotic plaque of the right thigh. BSA 10%  Right Lower Back, L post upper arm (2) Erythematous keratotic or waxy stuck-on papule or plaque.   Left Dorsal Hand Pink keratotic papule.   Assessment & Plan  Psoriasis trunk, extremities  With recent flare off biologic. Donnetta Hail)  Psoriasis is a chronic non-curable, but treatable genetic/hereditary disease that may have other systemic features affecting other organ systems such as joints (Psoriatic Arthritis). It is associated with an increased risk of  inflammatory bowel disease, heart disease, non-alcoholic fatty liver disease, and depression.    Plan to start [facility], pending approval.  Patient was given forms for patient assistance to fill out and bring back.  She had labs done at [facility] yesterday. When results come back, we will review and send in any remaining labs needed for patient prior to starting [place].   Continue Clobetasol ointment qd/bid Aas until improved, Avoid face, groin, axilla. Pt has at home.  Reviewed risks of biologics including immunosuppression, infections, injection site reaction, and failure to improve condition. Goal is control of skin condition, not cure.  Some older biologics such as Humira and Enbrel may slightly increase risk of malignancy and may worsen congestive heart failure. The use of biologics requires long term medication management, including periodic office visits and monitoring of blood work.    Related Medications clobetasol cream (TEMOVATE) 7.85 % Apply 1 application topically as directed. Qd to aa psoriasis on body until clear, avoid face, groin, axilla  Inflamed seborrheic keratosis Right Lower Back, L post upper arm  Recheck on f/up  Destruction of lesion - Right Lower Back, L post upper arm  Destruction method: cryotherapy   Informed consent: discussed and consent obtained   Lesion destroyed using liquid nitrogen: Yes   Region frozen until ice ball extended beyond lesion: Yes   Outcome: patient tolerated procedure well with no complications   Post-procedure details: wound care instructions given   Additional details:  Prior to procedure, discussed risks of blister formation, small wound, skin dyspigmentation, or rare scar following cryotherapy. Recommend  Vaseline ointment to treated areas while healing.   Hypertrophic actinic keratosis Left Dorsal Hand  Recheck on f/up  Actinic keratoses are precancerous spots that appear secondary to cumulative UV radiation exposure/sun  exposure over time. They are chronic with expected duration over 1 year. A portion of actinic keratoses will progress to squamous cell carcinoma of the skin. It is not possible to reliably predict which spots will progress to skin cancer and so treatment is recommended to prevent development of skin cancer.  Recommend daily broad spectrum sunscreen SPF 30+ to sun-exposed areas, reapply every 2 hours as needed.  Recommend staying in the shade or wearing long sleeves, sun glasses (UVA+UVB protection) and wide brim hats (4-inch brim around the entire circumference of the hat). Call for new or changing lesions.  Destruction of lesion - Left Dorsal Hand  Destruction method: cryotherapy   Informed consent: discussed and consent obtained   Lesion destroyed using liquid nitrogen: Yes   Region frozen until ice ball extended beyond lesion: Yes   Outcome: patient tolerated procedure well with no complications   Post-procedure details: wound care instructions given   Additional details:  Prior to procedure, discussed risks of blister formation, small wound, skin dyspigmentation, or rare scar following cryotherapy. Recommend Vaseline ointment to treated areas while healing.   Return in about 6 weeks (around 04/23/2021) for psoriasis, start Skyrizi, recheck AK/ISKs.  IJamesetta Orleans, CMA, am acting as scribe for Brendolyn Patty, MD . Documentation: I have reviewed the above documentation for accuracy and completeness, and I agree with the above.  Brendolyn Patty MD

## 2021-03-12 NOTE — Patient Instructions (Signed)

## 2021-03-17 ENCOUNTER — Other Ambulatory Visit: Payer: Self-pay

## 2021-03-17 ENCOUNTER — Telehealth: Payer: Self-pay

## 2021-03-17 DIAGNOSIS — L409 Psoriasis, unspecified: Secondary | ICD-10-CM

## 2021-03-17 NOTE — Telephone Encounter (Signed)
Left message for patient to return call. [facility] labs from 12/7 reviewed (CBC/diff, chem panel). Patient only needs TB test, which has been ordered through [place]. Will start approval for [facility] when results are back.

## 2021-03-17 NOTE — Telephone Encounter (Signed)
Patient was advised that Dr Chauncey Cruel reviewed labs from [facility]. TB test ordered and patient can have done at [facility]. Will call patient when we receive results.

## 2021-03-18 DIAGNOSIS — L409 Psoriasis, unspecified: Secondary | ICD-10-CM | POA: Diagnosis not present

## 2021-03-19 DIAGNOSIS — E782 Mixed hyperlipidemia: Secondary | ICD-10-CM | POA: Diagnosis not present

## 2021-03-19 DIAGNOSIS — D638 Anemia in other chronic diseases classified elsewhere: Secondary | ICD-10-CM | POA: Diagnosis not present

## 2021-03-19 DIAGNOSIS — R7303 Prediabetes: Secondary | ICD-10-CM | POA: Diagnosis not present

## 2021-03-19 DIAGNOSIS — I10 Essential (primary) hypertension: Secondary | ICD-10-CM | POA: Diagnosis not present

## 2021-03-19 DIAGNOSIS — Z0000 Encounter for general adult medical examination without abnormal findings: Secondary | ICD-10-CM | POA: Diagnosis not present

## 2021-03-19 DIAGNOSIS — F17210 Nicotine dependence, cigarettes, uncomplicated: Secondary | ICD-10-CM | POA: Diagnosis not present

## 2021-03-19 DIAGNOSIS — N184 Chronic kidney disease, stage 4 (severe): Secondary | ICD-10-CM | POA: Diagnosis not present

## 2021-03-21 LAB — QUANTIFERON-TB GOLD PLUS
QuantiFERON Mitogen Value: >10 IU/mL
QuantiFERON Nil Value: 0 IU/mL
QuantiFERON TB1 Ag Value: 0.04 IU/mL
QuantiFERON TB2 Ag Value: 0.01 IU/mL
QuantiFERON-TB Gold Plus: NEGATIVE

## 2021-03-26 DIAGNOSIS — J449 Chronic obstructive pulmonary disease, unspecified: Secondary | ICD-10-CM | POA: Diagnosis not present

## 2021-04-02 ENCOUNTER — Other Ambulatory Visit: Payer: Self-pay

## 2021-04-02 ENCOUNTER — Telehealth: Payer: Self-pay

## 2021-04-02 MED ORDER — SKYRIZI PEN 150 MG/ML ~~LOC~~ SOAJ: 150.0000 mg | SUBCUTANEOUS | 1 refills | Status: DC

## 2021-04-02 NOTE — Telephone Encounter (Signed)
Advised patient TB test was negative. Skyrizi Initial and Maintenance prescriptions sent to [facility].

## 2021-04-02 NOTE — Telephone Encounter (Signed)
-----   Message from Brendolyn Patty, MD sent at 04/01/2021 11:08 AM EST ----- TB negative - send in Rx for Skyrizi to [place].  please call pt

## 2021-04-09 DIAGNOSIS — N1832 Chronic kidney disease, stage 3b: Secondary | ICD-10-CM | POA: Diagnosis not present

## 2021-04-09 DIAGNOSIS — N2581 Secondary hyperparathyroidism of renal origin: Secondary | ICD-10-CM | POA: Diagnosis not present

## 2021-04-09 DIAGNOSIS — R809 Proteinuria, unspecified: Secondary | ICD-10-CM | POA: Diagnosis not present

## 2021-04-09 DIAGNOSIS — I10 Essential (primary) hypertension: Secondary | ICD-10-CM | POA: Diagnosis not present

## 2021-04-14 DIAGNOSIS — N2581 Secondary hyperparathyroidism of renal origin: Secondary | ICD-10-CM | POA: Diagnosis not present

## 2021-04-14 DIAGNOSIS — I10 Essential (primary) hypertension: Secondary | ICD-10-CM | POA: Diagnosis not present

## 2021-04-14 DIAGNOSIS — R809 Proteinuria, unspecified: Secondary | ICD-10-CM | POA: Diagnosis not present

## 2021-04-14 DIAGNOSIS — N1832 Chronic kidney disease, stage 3b: Secondary | ICD-10-CM | POA: Diagnosis not present

## 2021-04-22 ENCOUNTER — Ambulatory Visit: Payer: PPO | Admitting: Dermatology

## 2021-04-23 ENCOUNTER — Ambulatory Visit: Payer: PPO | Admitting: Dermatology

## 2021-04-23 DIAGNOSIS — J449 Chronic obstructive pulmonary disease, unspecified: Secondary | ICD-10-CM | POA: Diagnosis not present

## 2021-04-23 DIAGNOSIS — J441 Chronic obstructive pulmonary disease with (acute) exacerbation: Secondary | ICD-10-CM | POA: Diagnosis not present

## 2021-04-23 DIAGNOSIS — J432 Centrilobular emphysema: Secondary | ICD-10-CM | POA: Diagnosis not present

## 2021-04-30 ENCOUNTER — Ambulatory Visit: Payer: PPO | Admitting: Dermatology

## 2021-04-30 ENCOUNTER — Other Ambulatory Visit: Payer: Self-pay

## 2021-04-30 DIAGNOSIS — L409 Psoriasis, unspecified: Secondary | ICD-10-CM | POA: Diagnosis not present

## 2021-04-30 DIAGNOSIS — Z79899 Other long term (current) drug therapy: Secondary | ICD-10-CM

## 2021-04-30 DIAGNOSIS — J449 Chronic obstructive pulmonary disease, unspecified: Secondary | ICD-10-CM | POA: Diagnosis not present

## 2021-04-30 DIAGNOSIS — L820 Inflamed seborrheic keratosis: Secondary | ICD-10-CM | POA: Diagnosis not present

## 2021-04-30 MED ORDER — RISANKIZUMAB-RZAA 150 MG/ML ~~LOC~~ SOAJ
150.0000 mg | Freq: Once | SUBCUTANEOUS | Status: AC
  Administered 2021-04-30: 13:00:00 150 mg via SUBCUTANEOUS

## 2021-04-30 NOTE — Progress Notes (Signed)
° °  Follow-Up Visit   Subjective  Lisa Wilkerson is a 76 y.o. female who presents for the following: Psoriasis.    The following portions of the chart were reviewed this encounter and updated as appropriate:       Review of Systems:  No other skin or systemic complaints except as noted in HPI or Assessment and Plan.  Objective  Well appearing patient in no apparent distress; mood and affect are within normal limits.  A focused examination was performed including face. Relevant physical exam findings are noted in the Assessment and Plan.  trunk; extremities Pink scaly plaques and papules of the elbows, arms, hands, L palm, upper back, thighs, knees, plantar feet; thick hyperkeratotic plaque of the right thigh  R upper abdomen, L ant hip Erythematous stuck-on, waxy papule or plaque    Assessment & Plan  Psoriasis trunk; extremities  With flare since off biologic x 1 year.Donnetta Hail)  Start Skyrizi 150mg /mL pen - inject subcutaneously on week 0 and 4, then every 12 weeks starting at week 16.  Skyrizi 150mg /mL injected to the right upper thigh today (1st injection). Patient tolerated well. She will self-inject at home for all future injections starting with 2nd injection in 4 weeks.  Continue Clobetasol Ointment qd/bid AA. Avoid face, groin, axilla. Pt has at home.    Psoriasis - severe on systemic biologic treatment injections.  Psoriasis is a chronic non-curable, but treatable genetic/hereditary disease that may have other systemic features affecting other organ systems such as joints (Psoriatic Arthritis).  It is linked with heart disease, inflammatory bowel disease, non-alcoholic fatty liver disease, and depression. Significant skin psoriasis and/or psoriatic arthritis may have significant symptoms and affects activities of daily activity and often benefits from systemic biologic injection treatments.  These biologic treatments have some potential side effects including  immunosuppression and require pre-treatment laboratory screening and periodic laboratory monitoring and periodic in person evaluation and monitoring by the attending dermatologist physician (long term medication management).  Topical steroids (such as triamcinolone, fluocinolone, fluocinonide, mometasone, clobetasol, halobetasol, betamethasone, hydrocortisone) can cause thinning and lightening of the skin if they are used for too long in the same area. Your physician has selected the right strength medicine for your problem and area affected on the body. Please use your medication only as directed by your physician to prevent side effects.   Reviewed risks of biologics including immunosuppression, infections, injection site reaction, and failure to improve condition. Goal is control of skin condition, not cure.  Some older biologics such as Humira and Enbrel may slightly increase risk of malignancy and may worsen congestive heart failure. The use of biologics requires long term medication management, including periodic office visits and monitoring of blood work.    Related Medications clobetasol cream (TEMOVATE) 5.63 % Apply 1 application topically as directed. Qd to aa psoriasis on body until clear, avoid face, groin, axilla  Risankizumab-rzaa SOAJ 150 mg   Inflamed seborrheic keratosis R upper abdomen, L ant hip  Patient defers treatment until follow-up appointment. Plan treatment with cryotherapy at next appt.     Return as scheduled, for Psoriasis, tx ISKs, recheck AK L dorsal hand tx'd at previous appt.  IJamesetta Orleans, CMA, am acting as scribe for Brendolyn Patty, MD .  Documentation: I have reviewed the above documentation for accuracy and completeness, and I agree with the above.  Brendolyn Patty MD

## 2021-04-30 NOTE — Patient Instructions (Addendum)
Reviewed risks of biologics including immunosuppression, infections, injection site reaction, and failure to improve condition. Goal is control of skin condition, not cure.  Some older biologics such as Humira and Enbrel may slightly increase risk of malignancy and may worsen congestive heart failure. The use of biologics requires long term medication management, including periodic office visits and monitoring of blood work. ° °Topical steroids (such as triamcinolone, fluocinolone, fluocinonide, mometasone, clobetasol, halobetasol, betamethasone, hydrocortisone) can cause thinning and lightening of the skin if they are used for too long in the same area. Your physician has selected the right strength medicine for your problem and area affected on the body. Please use your medication only as directed by your physician to prevent side effects.  ° ° °If You Need Anything After Your Visit ° °If you have any questions or concerns for your doctor, please call our main line at [phone] and press option 4 to reach your doctor's medical assistant. If no one answers, please leave a voicemail as directed and we will return your call as soon as possible. Messages left after 4 pm will be answered the following business day.  ° °You may also send us a message via MyChart. We typically respond to MyChart messages within 1-2 business days. ° °For prescription refills, please ask your pharmacy to contact our office. Our fax number is [phone]. ° °If you have an urgent issue when the clinic is closed that cannot wait until the next business day, you can page your doctor at the number below.   ° °Please note that while we do our best to be available for urgent issues outside of office hours, we are not available 24/7.  ° °If you have an urgent issue and are unable to reach us, you may choose to seek medical care at your doctor's office, retail clinic, urgent care center, or emergency room. ° °If you have a medical emergency,  please immediately call 911 or go to the emergency department. ° °Pager Numbers ° °- Dr. Kowalski: [phone] ° °- Dr. Moye: [phone] ° °- Dr. Stewart: [phone] ° °In the event of inclement weather, please call our main line at [phone] for an update on the status of any delays or closures. ° °Dermatology Medication Tips: °Please keep the boxes that topical medications come in in order to help keep track of the instructions about where and how to use these. Pharmacies typically print the medication instructions only on the boxes and not directly on the medication tubes.  ° °If your medication is too expensive, please contact our office at [phone] option 4 or send us a message through MyChart.  ° °We are unable to tell what your co-pay for medications will be in advance as this is different depending on your insurance coverage. However, we may be able to find a substitute medication at lower cost or fill out paperwork to get insurance to cover a needed medication.  ° °If a prior authorization is required to get your medication covered by your insurance company, please allow us 1-2 business days to complete this process. ° °Drug prices often vary depending on where the prescription is filled and some pharmacies may offer cheaper prices. ° °The website www.goodrx.com contains coupons for medications through different pharmacies. The prices here do not account for what the cost may be with help from insurance (it may be cheaper with your insurance), but the website can give you the price if you did not use any insurance.  °- You can   print the associated coupon and take it with your prescription to the pharmacy.  °- You may also stop by our office during regular business hours and pick up a GoodRx coupon card.  °- If you need your prescription sent electronically to a different pharmacy, notify our office through [place] MyChart or by phone at [phone] option 4. ° ° ° ° °Si Usted Necesita Algo  Después de Su Visita ° °También puede enviarnos un mensaje a través de MyChart. Por lo general respondemos a los mensajes de MyChart en el transcurso de 1 a 2 días hábiles. ° °Para renovar recetas, por favor pida a su farmacia que se ponga en contacto con nuestra oficina. Nuestro número de fax es el [phone]. ° °Si tiene un asunto urgente cuando la clínica esté cerrada y que no puede esperar hasta el siguiente día hábil, puede llamar/localizar a su doctor(a) al número que aparece a continuación.  ° °Por favor, tenga en cuenta que aunque hacemos todo lo posible para estar disponibles para asuntos urgentes fuera del horario de oficina, no estamos disponibles las 24 horas del día, los 7 días de la semana.  ° °Si tiene un problema urgente y no puede comunicarse con nosotros, puede optar por buscar atención médica  en el consultorio de su doctor(a), en una clínica privada, en un centro de atención urgente o en una sala de emergencias. ° °Si tiene una emergencia médica, por favor llame inmediatamente al 911 o vaya a la sala de emergencias. ° °Números de bíper ° °- Dr. Kowalski: [phone] ° °- Dra. Moye: [phone] ° °- Dra. Stewart: [phone] ° °En caso de inclemencias del tiempo, por favor llame a nuestra línea principal al [phone] para una actualización sobre el estado de cualquier retraso o cierre. ° °Consejos para la medicación en dermatología: °Por favor, guarde las cajas en las que vienen los medicamentos de uso tópico para ayudarle a seguir las instrucciones sobre dónde y cómo usarlos. Las farmacias generalmente imprimen las instrucciones del medicamento sólo en las cajas y no directamente en los tubos del medicamento.  ° °Si su medicamento es muy caro, por favor, póngase en contacto con nuestra oficina llamando al [phone] y presione la opción 4 o envíenos un mensaje a través de MyChart.  ° °No podemos decirle cuál será su copago por los medicamentos por adelantado ya que esto es diferente  dependiendo de la cobertura de su seguro. Sin embargo, es posible que podamos encontrar un medicamento sustituto a menor costo o llenar un formulario para que el seguro cubra el medicamento que se considera necesario.  ° °Si se requiere una autorización previa para que su compañía de seguros cubra su medicamento, por favor permítanos de 1 a 2 días hábiles para completar este proceso. ° °Los precios de los medicamentos varían con frecuencia dependiendo del lugar de dónde se surte la receta y alguna farmacias pueden ofrecer precios más baratos. ° °El sitio web www.goodrx.com tiene cupones para medicamentos de diferentes farmacias. Los precios aquí no tienen en cuenta lo que podría costar con la ayuda del seguro (puede ser más barato con su seguro), pero el sitio web puede darle el precio si no utilizó ningún seguro.  °- Puede imprimir el cupón correspondiente y llevarlo con su receta a la farmacia.  °- También puede pasar por nuestra oficina durante el horario de atención regular y recoger una tarjeta de cupones de GoodRx.  °- Si necesita que su receta se envíe electrónicamente a una farmacia diferente, informe a nuestra   oficina a través de MyChart de [place] o por teléfono llamando al [phone] y presione la opción 4. ° °

## 2021-05-13 DIAGNOSIS — J449 Chronic obstructive pulmonary disease, unspecified: Secondary | ICD-10-CM | POA: Diagnosis not present

## 2021-05-27 DIAGNOSIS — I714 Abdominal aortic aneurysm, without rupture, unspecified: Secondary | ICD-10-CM | POA: Diagnosis not present

## 2021-05-27 DIAGNOSIS — Z01818 Encounter for other preprocedural examination: Secondary | ICD-10-CM | POA: Diagnosis not present

## 2021-05-27 DIAGNOSIS — J410 Simple chronic bronchitis: Secondary | ICD-10-CM | POA: Diagnosis not present

## 2021-05-30 DIAGNOSIS — J449 Chronic obstructive pulmonary disease, unspecified: Secondary | ICD-10-CM | POA: Diagnosis not present

## 2021-06-09 DIAGNOSIS — J449 Chronic obstructive pulmonary disease, unspecified: Secondary | ICD-10-CM | POA: Diagnosis not present

## 2021-06-09 DIAGNOSIS — J432 Centrilobular emphysema: Secondary | ICD-10-CM | POA: Diagnosis not present

## 2021-06-17 DIAGNOSIS — G4733 Obstructive sleep apnea (adult) (pediatric): Secondary | ICD-10-CM | POA: Diagnosis not present

## 2021-06-17 DIAGNOSIS — E782 Mixed hyperlipidemia: Secondary | ICD-10-CM | POA: Diagnosis not present

## 2021-06-17 DIAGNOSIS — Z9989 Dependence on other enabling machines and devices: Secondary | ICD-10-CM | POA: Diagnosis not present

## 2021-06-17 DIAGNOSIS — I10 Essential (primary) hypertension: Secondary | ICD-10-CM | POA: Diagnosis not present

## 2021-06-17 DIAGNOSIS — F17200 Nicotine dependence, unspecified, uncomplicated: Secondary | ICD-10-CM | POA: Diagnosis not present

## 2021-06-17 DIAGNOSIS — I259 Chronic ischemic heart disease, unspecified: Secondary | ICD-10-CM | POA: Diagnosis not present

## 2021-06-17 DIAGNOSIS — I2510 Atherosclerotic heart disease of native coronary artery without angina pectoris: Secondary | ICD-10-CM | POA: Diagnosis not present

## 2021-06-23 ENCOUNTER — Ambulatory Visit: Payer: PPO | Admitting: Dermatology

## 2021-06-24 ENCOUNTER — Other Ambulatory Visit: Payer: Self-pay

## 2021-06-24 ENCOUNTER — Ambulatory Visit: Payer: PPO | Admitting: Dermatology

## 2021-06-24 DIAGNOSIS — L820 Inflamed seborrheic keratosis: Secondary | ICD-10-CM

## 2021-06-24 DIAGNOSIS — Z79899 Other long term (current) drug therapy: Secondary | ICD-10-CM

## 2021-06-24 DIAGNOSIS — L409 Psoriasis, unspecified: Secondary | ICD-10-CM | POA: Diagnosis not present

## 2021-06-24 NOTE — Progress Notes (Signed)
? ?Follow-Up Visit ?  ?Subjective  ?Lisa Wilkerson is a 76 y.o. female who presents for the following: Psoriasis (2 month follow-up. Much improved since starting Skyrizi injections 04/30/2021, but she has one area on her left palm that will not clear.  She has since had her second injection at home (next due in May). No injection site reactions, no side effects. She has clobetasol cream and ointment at home if needed.) and Inflamed SKs (R upper abdomen and L anterior hip, needs treatment with cryotherapy). She also has an irritated spot in her left scalp she would like checked.  ? ? ?The following portions of the chart were reviewed this encounter and updated as appropriate:  ?  ?  ? ?Review of Systems:  No other skin or systemic complaints except as noted in HPI or Assessment and Plan. ? ?Objective  ?Well appearing patient in no apparent distress; mood and affect are within normal limits. ? ?A focused examination was performed including trunk, extremities. Relevant physical exam findings are noted in the Assessment and Plan. ? ?trunk; extremities ?Light pink scaly patch on the right hip; hyperkeratotic patch with fissure of the left palm ? ?L temporal scalp x 1, R upper abdomen x 1, L ant hip x 1 (3) ?Erythematous stuck-on, waxy papule ? ? ? ?Assessment & Plan  ?Psoriasis ?trunk; extremities ? ?Chronic and persistent condition with duration or expected duration over one year. Condition is symptomatic / bothersome to patient. Not to goal, but much improved. ? ?Psoriasis - severe on systemic ?biologic? treatment injections.  Psoriasis is a chronic non-curable, but treatable genetic/hereditary disease that may have other systemic features affecting other organ systems such as joints (Psoriatic Arthritis).  It is linked with heart disease, inflammatory bowel disease, non-alcoholic fatty liver disease, and depression. Significant skin psoriasis and/or psoriatic arthritis may have significant symptoms and affects  activities of daily activity and often benefits from systemic ?biologic? injection treatments.  These ?biologic? treatments have some potential side effects including immunosuppression and require pre-treatment laboratory screening and periodic laboratory monitoring and periodic in person evaluation and monitoring by the attending dermatologist physician (long term medication management). ? ?Start Zoryve Cream - sample given to use on area on left palm qhs. Avoid scratching/rubbing. ?Continue Skyrizi injections q 12 weeks. ?Continue Clobetasol Cream/Ointment qd/bid to hand and prn flares. Avoid face, groin, axilla.  ? ?Recommend starting moisturizer with exfoliant (Urea, Salicylic acid, or Lactic acid) one to two times daily to help smooth rough and bumpy skin.  OTC options include Cetaphil Rough and Bumpy lotion (Urea), Eucerin Roughness Relief lotion or spot treatment cream (Urea), CeraVe SA lotion/cream for Rough and Bumpy skin (Sal Acid), Gold Bond Rough and Bumpy cream (Sal Acid), and AmLactin 12% lotion/cream (Lactic Acid).  If applying in morning, also apply sunscreen to sun-exposed areas, since these exfoliating moisturizers can increase sensitivity to sun. ? ?Topical steroids (such as triamcinolone, fluocinolone, fluocinonide, mometasone, clobetasol, halobetasol, betamethasone, hydrocortisone) can cause thinning and lightening of the skin if they are used for too long in the same area. Your physician has selected the right strength medicine for your problem and area affected on the body. Please use your medication only as directed by your physician to prevent side effects.  ? ? ?Related Medications ?clobetasol cream (TEMOVATE) 0.05 % ?Apply 1 application topically as directed. Qd to aa psoriasis on body until clear, avoid face, groin, axilla ? ?Inflamed seborrheic keratosis (3) ?L temporal scalp x 1, R upper abdomen x 1, L  ant hip x 1 ? ?Destruction of lesion - L temporal scalp x 1, R upper abdomen x 1, L  ant hip x 1 ? ?Destruction method: cryotherapy   ?Informed consent: discussed and consent obtained   ?Lesion destroyed using liquid nitrogen: Yes   ?Region frozen until ice ball extended beyond lesion: Yes   ?Outcome: patient tolerated procedure well with no complications   ?Post-procedure details: wound care instructions given   ?Additional details:  Prior to procedure, discussed risks of blister formation, small wound, skin dyspigmentation, or rare scar following cryotherapy. Recommend Vaseline ointment to treated areas while healing. ? ? ? ?Return in about 6 months (around 12/25/2021) for Psoriasis. ? ?I, Jamesetta Orleans, CMA, am acting as scribe for Brendolyn Patty, MD . ?Documentation: I have reviewed the above documentation for accuracy and completeness, and I agree with the above. ? ?Brendolyn Patty MD  ? ?

## 2021-06-24 NOTE — Patient Instructions (Addendum)
Zoryve Cream - Apply to psoriasis on left palm every night until improved.  ? ?Recommend starting moisturizer with exfoliant (Urea, Salicylic acid, or Lactic acid) one to two times daily to help smooth rough and bumpy skin.  OTC options include Cetaphil Rough and Bumpy lotion (Urea), Eucerin Roughness Relief lotion or spot treatment cream (Urea), CeraVe SA lotion/cream for Rough and Bumpy skin (Sal Acid), Gold Bond Rough and Bumpy cream (Sal Acid), and AmLactin 12% lotion/cream (Lactic Acid).  If applying in morning, also apply sunscreen to sun-exposed areas, since these exfoliating moisturizers can increase sensitivity to sun. ? ?Reviewed risks of biologics including immunosuppression, infections, injection site reaction, and failure to improve condition. Goal is control of skin condition, not cure.  Some older biologics such as Humira and Enbrel may slightly increase risk of malignancy and may worsen congestive heart failure. The use of biologics requires long term medication management, including periodic office visits and monitoring of blood work. ? ?Topical steroids (such as triamcinolone, fluocinolone, fluocinonide, mometasone, clobetasol, halobetasol, betamethasone, hydrocortisone) can cause thinning and lightening of the skin if they are used for too long in the same area. Your physician has selected the right strength medicine for your problem and area affected on the body. Please use your medication only as directed by your physician to prevent side effects.  ? ?Cryotherapy Aftercare ? ?Wash gently with soap and water everyday.   ?Apply Vaseline and Band-Aid daily until healed.  ? ? ?If You Need Anything After Your Visit ? ?If you have any questions or concerns for your doctor, please call our main line at [phone] and press option 4 to reach your doctor's medical assistant. If no one answers, please leave a voicemail as directed and we will return your call as soon as possible. Messages left after 4  pm will be answered the following business day.  ? ?You may also send [place] a message via MyChart. We typically respond to MyChart messages within 1-2 business days. ? ?For prescription refills, please ask your pharmacy to contact our office. Our fax number is [phone]. ? ?If you have an urgent issue when the clinic is closed that cannot wait until the next business day, you can page your doctor at the number below.   ? ?Please note that while we do our best to be available for urgent issues outside of office hours, we are not available 24/7.  ? ?If you have an urgent issue and are unable to reach [place], you may choose to seek medical care at your doctor's office, retail clinic, urgent care center, or emergency room. ? ?If you have a medical emergency, please immediately call 911 or go to the emergency department. ? ?Pager Numbers ? ?- Dr. Nehemiah Massed: [phone] ? ?- Dr. Laurence Ferrari: [phone] ? ?- Dr. Nicole Kindred: [phone] ? ?In the event of inclement weather, please call our main line at [phone] for an update on the status of any delays or closures. ? ?Dermatology Medication Tips: ?Please keep the boxes that topical medications come in in order to help keep track of the instructions about where and how to use these. Pharmacies typically print the medication instructions only on the boxes and not directly on the medication tubes.  ? ?If your medication is too expensive, please contact our office at [phone] option 4 or send [place] a message through [place].  ? ?We are unable to tell what your co-pay for medications will be in advance as this is different depending on your insurance coverage. However,  we may be able to find a substitute medication at lower cost or fill out paperwork to get insurance to cover a needed medication.  ? ?If a prior authorization is required to get your medication covered by your insurance company, please allow [place] 1-2 business days to complete this process. ? ?Drug prices often vary  depending on where the prescription is filled and some pharmacies may offer cheaper prices. ? ?The website www.goodrx.com contains coupons for medications through different pharmacies. The prices here do not account for what the cost may be with help from insurance (it may be cheaper with your insurance), but the website can give you the price if you did not use any insurance.  ?- You can print the associated coupon and take it with your prescription to the pharmacy.  ?- You may also stop by our office during regular business hours and pick up a GoodRx coupon card.  ?- If you need your prescription sent electronically to a different pharmacy, notify our office through [facility] or by phone at [phone] option 4. ? ? ? ? ?Si Usted Necesita Algo Despu?s de Su Visita ? ?Tambi?n puede enviarnos un mensaje a trav?s de MyChart. Por lo general respondemos a los mensajes de MyChart en el transcurso de 1 a 2 d?as h?biles. ? ?Para renovar recetas, por favor pida a su farmacia que se ponga en contacto con nuestra oficina. Nuestro n?mero de fax es el [phone]. ? ?Si tiene un asunto urgente cuando la cl?nica est? cerrada y que no puede esperar hasta el siguiente d?a h?bil, puede llamar/localizar a su doctor(a) al n?mero que aparece a continuaci?n.  ? ?Por favor, tenga en cuenta que aunque hacemos todo lo posible para estar disponibles para asuntos urgentes fuera del horario de oficina, no estamos disponibles las 24 horas del d?a, los 7 d?as de la semana.  ? ?Si tiene un problema urgente y no puede comunicarse con nosotros, puede optar por buscar atenci?n m?dica  en el consultorio de su doctor(a), en una cl?nica privada, en un centro de atenci?n urgente o en una sala de emergencias. ? ?Si tiene Engineer, maintenance (IT) m?dica, por favor llame inmediatamente al 911 o vaya a la sala de emergencias. ? ?N?meros de b?per ? ?- Dr. Nehemiah Massed: [phone] ? ?- Dra. Moye: [phone] ? ?- Dra. Nicole Kindred: [phone] ? ?En caso de  inclemencias del tiempo, por favor llame a nuestra l?nea principal al [phone] para una actualizaci?n sobre el estado de cualquier retraso o cierre. ? ?Consejos para la medicaci?n en dermatolog?a: ?Por favor, guarde las cajas en las que vienen los medicamentos de uso t?pico para ayudarle a seguir las instrucciones sobre d?nde y c?mo usarlos. Las farmacias generalmente imprimen las instrucciones del medicamento s?lo en las cajas y no directamente en los tubos del [place].  ? ?Si su medicamento es muy caro, por favor, p?ngase en contacto con Zigmund Daniel llamando al [phone] y presione la opci?n 4 o env?enos un mensaje a trav?s de MyChart.  ? ?No podemos decirle cu?l ser? su copago por los medicamentos por adelantado ya que esto es diferente dependiendo de la cobertura de su seguro. Sin embargo, es posible que podamos encontrar un medicamento sustituto a Electrical engineer un formulario para que el seguro cubra el medicamento que se considera necesario.  ? ?Si se requiere Ardelia Mems autorizaci?n previa para que su compa??a de seguros [place] su medicamento, por favor perm?tanos de 1 a 2 d?as h?biles para completar este proceso. ? ?Los precios de los  medicamentos var?an con frecuencia dependiendo del lugar de d?nde se surte la receta y alguna farmacias pueden ofrecer precios m?s baratos. ? ?El sitio web www.goodrx.com tiene cupones para medicamentos de Airline pilot. Los precios aqu? no tienen en cuenta lo que podr?a costar con la ayuda del seguro (puede ser m?s barato con su seguro), pero el sitio web puede darle el precio si no utiliz? ning?n seguro.  ?- Puede imprimir el cup?n correspondiente y llevarlo con su receta a la farmacia.  ?- Tambi?n puede pasar por nuestra oficina durante el horario de atenci?n regular y recoger una tarjeta de cupones de GoodRx.  ?- Si necesita que su receta se env?e electr?nicamente a Chiropodist, informe a nuestra oficina a trav?s de MyChart de [place] o  por tel?fono llamando al [phone] y presione la opci?n 4. ? ?

## 2021-06-25 ENCOUNTER — Other Ambulatory Visit (INDEPENDENT_AMBULATORY_CARE_PROVIDER_SITE_OTHER): Payer: Self-pay | Admitting: Vascular Surgery

## 2021-06-25 DIAGNOSIS — I714 Abdominal aortic aneurysm, without rupture, unspecified: Secondary | ICD-10-CM

## 2021-06-26 ENCOUNTER — Ambulatory Visit (INDEPENDENT_AMBULATORY_CARE_PROVIDER_SITE_OTHER): Payer: HMO | Admitting: Vascular Surgery

## 2021-06-26 ENCOUNTER — Ambulatory Visit (INDEPENDENT_AMBULATORY_CARE_PROVIDER_SITE_OTHER): Payer: Self-pay

## 2021-06-26 ENCOUNTER — Encounter (INDEPENDENT_AMBULATORY_CARE_PROVIDER_SITE_OTHER): Payer: Self-pay | Admitting: Vascular Surgery

## 2021-06-26 ENCOUNTER — Other Ambulatory Visit: Payer: Self-pay

## 2021-06-26 VITALS — BP 169/79 | HR 78 | Resp 17 | Ht 62.0 in | Wt 126.2 lb

## 2021-06-26 DIAGNOSIS — E785 Hyperlipidemia, unspecified: Secondary | ICD-10-CM | POA: Diagnosis not present

## 2021-06-26 DIAGNOSIS — I714 Abdominal aortic aneurysm, without rupture, unspecified: Secondary | ICD-10-CM | POA: Insufficient documentation

## 2021-06-26 DIAGNOSIS — I6523 Occlusion and stenosis of bilateral carotid arteries: Secondary | ICD-10-CM | POA: Diagnosis not present

## 2021-06-26 DIAGNOSIS — I7143 Infrarenal abdominal aortic aneurysm, without rupture: Secondary | ICD-10-CM | POA: Diagnosis not present

## 2021-06-26 DIAGNOSIS — I10 Essential (primary) hypertension: Secondary | ICD-10-CM | POA: Diagnosis not present

## 2021-06-26 NOTE — Progress Notes (Signed)
? ? ?MRN : [id number] ? ?Lisa Wilkerson is a 76 y.o. (12-29-1945) female who presents with chief complaint of check AAA. ? ?History of Present Illness:  ?The patient returns to the office for surveillance of a known abdominal aortic aneurysm. Patient denies abdominal pain or back pain, no other abdominal complaints. No changes suggesting embolic episodes.  ? ?There have been no interval changes in the patient's overall health care since his last visit. ? ?Patient is also followed for her carotid disease. She has a previous history of TIA, but no recent focal cerebrovascular symptoms. The patient is now 4 months status post left carotid endarterectomy. She is doing well. Her duplex today shows less than 40% right ICA stenosis and a widely patent left carotid endarterectomy.  ? 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  ? ?Current Meds  ?Medication Sig  ? albuterol (PROVENTIL HFA;VENTOLIN HFA) 108 (90 Base) MCG/ACT inhaler Inhale 2 puffs into the lungs every 6 (six) hours as needed for wheezing or shortness of breath.  ? amLODipine (NORVASC) 10 MG tablet Take 10 mg by mouth daily.  ? aspirin 81 MG tablet Take 81 mg by mouth daily.  ? atorvastatin (LIPITOR) 40 MG tablet Take 40 mg by mouth daily.  ? clobetasol ointment (TEMOVATE) 0.05 % Apply topically as directed. Qd ti bid aa psoriasis on body until clear, then prn flares, avoid face, groin, axilla  ? clopidogrel (PLAVIX) 75 MG tablet Take 75 mg by mouth daily.  ? clotrimazole-betamethasone (LOTRISONE) cream Apply 1 application topically 2 (two) times daily as needed.   ? isosorbide mononitrate (IMDUR) 30 MG 24 hr tablet Take 30 mg by mouth daily.  ? Risankizumab-rzaa (SKYRIZI PEN) 150 MG/ML SOAJ Inject 150 mg into the skin as directed. At weeks 0 & 4.  ? Risankizumab-rzaa (SKYRIZI PEN) 150 MG/ML SOAJ Inject 150 mg into the skin as directed. Every 12 weeks for maintenance.  ? ? ?Past Medical  History:  ?Diagnosis Date  ? Actinic keratosis 10/29/2014  ? L hand thenar - biopsy proven  ? Actinic keratosis 08/17/2017  ? L forearm - biopsy proven  ? Arthritis   ? Basal cell carcinoma of skin   ? Chronic kidney disease   ? Stage 3 Chronic Kidney Disease  ? Coronary artery disease   ? Hyperlipidemia   ? Hypertension   ? Myocardial infarction ([place]) 07/2009  ? Peripheral vascular disease ([place])   ? Psoriasis   ? Sleep apnea   ? no CPAP  ? 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 L hand dorsum SCCIS  ? Squamous cell carcinoma of skin 11/23/2017  ? L mid forearm   ? Squamous cell skin cancer 06/18/2014  ? L lat lower leg below the knee  ? TIA (transient ischemic attack) 2014  ? no deficits  ? Vertigo   ? no issues for several years  ? Wears dentures   ? full upper and lower  ? ? ?Past Surgical History:  ?Procedure Laterality Date  ? CARDIAC CATHETERIZATION  2011  ? ENDARTERECTOMY Left 11/28/2015  ? Procedure: ENDARTERECTOMY CAROTID;  Surgeon: Algernon Huxley, MD;  Location: [facility];  Service: Vascular;  Laterality: Left;  ? EYE SURGERY Bilateral   ? Cataract Extraction with IOL  ? MYRINGOTOMY WITH TUBE PLACEMENT Bilateral 08/19/2016  ? Procedure: MYRINGOTOMY WITH TUBE PLACEMENT;  Surgeon: Carloyn Manner, MD;  Location: [place];  Service:  ENT;  Laterality: Bilateral;  sleep apnea  ? MYRINGOTOMY WITH TUBE PLACEMENT Bilateral 08/04/2017  ? Procedure: MYRINGOTOMY WITH TUBE PLACEMENT           BUTTERFLY TUBES;  Surgeon: Carloyn Manner, MD;  Location: [place];  Service: ENT;  Laterality: Bilateral;  ? UVULOPALATOPHARYNGOPLASTY  2000  ? ? ?Social History ?Social History  ? ?Tobacco Use  ? Smoking status: Every Day  ?  Packs/day: 1.00  ?  Years: 58.00  ?  Pack years: 58.00  ?  Types: Cigarettes  ? Smokeless tobacco: Never  ?Vaping Use  ? Vaping Use: Never used  ?Substance Use Topics  ? Alcohol use: No  ? Drug use: No  ? ? ?Family History ?No family history on file. ? ?Allergies  ?Allergen  Reactions  ? Codeine Other (See Comments)  ?  She states it paralyzes her and she can't move.  ? ? ? ?REVIEW OF SYSTEMS (Negative unless checked) ? ?Constitutional: '[]'$ Weight loss  '[]'$ Fever  '[]'$ Chills ?Cardiac: '[]'$ Chest pain   '[]'$ Chest pressure   '[]'$ Palpitations   '[]'$ Shortness of breath when laying flat   '[]'$ Shortness of breath with exertion. ?Vascular:  '[]'$ Pain in legs with walking   '[]'$ Pain in legs at rest  '[]'$ History of DVT   '[]'$ Phlebitis   '[]'$ Swelling in legs   '[]'$ Varicose veins   '[]'$ Non-healing ulcers ?Pulmonary:   '[]'$ Uses home oxygen   '[]'$ Productive cough   '[]'$ Hemoptysis   '[]'$ Wheeze  '[]'$ COPD   '[]'$ Asthma ?Neurologic:  '[]'$ Dizziness   '[]'$ Seizures   '[]'$ History of stroke   '[]'$ History of TIA  '[]'$ Aphasia   '[]'$ Vissual changes   '[]'$ Weakness or numbness in arm   '[]'$ Weakness or numbness in leg ?Musculoskeletal:   '[]'$ Joint swelling   '[]'$ Joint pain   '[]'$ Low back pain ?Hematologic:  '[]'$ Easy bruising  '[]'$ Easy bleeding   '[]'$ Hypercoagulable state   '[]'$ Anemic ?Gastrointestinal:  '[]'$ Diarrhea   '[]'$ Vomiting  '[]'$ Gastroesophageal reflux/heartburn   '[]'$ Difficulty swallowing. ?Genitourinary:  '[]'$ Chronic kidney disease   '[]'$ Difficult urination  '[]'$ Frequent urination   '[]'$ Blood in urine ?Skin:  '[]'$ Rashes   '[]'$ Ulcers  ?Psychological:  '[]'$ History of anxiety   '[]'$  History of major depression. ? ?Physical Examination ? ?Vitals:  ? 06/26/21 1042  ?BP: (!) 169/79  ?Pulse: 78  ?Resp: 17  ?Weight: 126 lb 3.2 oz (57.2 kg)  ?Height: '5\' 2"'$  (1.575 m)  ? ?Body mass index is 23.08 kg/m?. ?Gen: WD/WN, NAD ?Head: [place]/AT, No temporalis wasting.  ?Ear/Nose/Throat: Hearing grossly intact, nares w/o erythema or drainage ?Eyes: PER, EOMI, sclera nonicteric.  ?Neck: Supple, no masses.  No bruit or JVD.  ?Pulmonary:  Good air movement, no audible wheezing, no use of accessory muscles.  ?Cardiac: RRR, normal S1, S2, no Murmurs. ?Vascular:  carotid bruit noted ?Vessel Right Left  ?Radial Palpable Palpable  ?Carotid Palpable Palpable  ?PT Palpable Palpable  ?DP Palpable Palpable  ?Gastrointestinal: soft,  non-distended. No guarding/no peritoneal signs.  ?Musculoskeletal: M/S 5/5 throughout.  No visible deformity.  ?Neurologic: CN 2-12 intact. Pain and light touch intact in extremities.  Symmetrical.  Speech is fluent. Motor exam as listed above. ?Psychiatric: Judgment intact, Mood & affect appropriate for pt's clinical situation. ?Dermatologic: No rashes or ulcers noted.  No changes consistent with cellulitis. ? ? ?CBC ?Lab Results  ?Component Value Date  ? WBC 5.4 02/07/2018  ? HGB 12.4 02/07/2018  ? HCT 38.8 02/07/2018  ? MCV 86.8 02/07/2018  ? PLT 254 02/07/2018  ? ? ?BMET ?   ?Component Value Date/Time  ? NA 137 02/07/2018 0541  ?  K 3.8 02/07/2018 0541  ? CL 103 02/07/2018 0541  ? CO2 26 02/07/2018 0541  ? GLUCOSE 135 (H) 02/07/2018 0541  ? BUN 23 02/07/2018 0541  ? CREATININE 1.25 (H) 02/07/2018 0541  ? CALCIUM 8.5 (L) 02/07/2018 0541  ? GFRNONAA 42 (L) 02/07/2018 0541  ? GFRAA 49 (L) 02/07/2018 0541  ? ?CrCl cannot be calculated (Patient's most recent lab result is older than the maximum 21 days allowed.). ? ?COAG ?Lab Results  ?Component Value Date  ? INR 0.95 02/06/2018  ? INR 0.87 11/22/2015  ? ? ?Radiology ?No results found. ? ? ?Assessment/Plan ?1. Abdominal aortic aneurysm (AAA) without rupture, unspecified part ?No surgery or intervention at this time. ?The patient has an asymptomatic abdominal aortic aneurysm that is less than 4 cm in maximal diameter.  I have discussed the natural history of abdominal aortic aneurysm and the small risk of rupture for aneurysm less than 5 cm in size.  However, as these small aneurysms tend to enlarge over time, continued surveillance with ultrasound or CT scan is mandatory.  ?I have also discussed optimizing medical management with hypertension and lipid control and the importance of abstinence from tobacco.  The patient is also encouraged to exercise a minimum of 30 minutes 4 times a week.  ?Should the patient develop new onset abdominal or back pain or signs of  peripheral embolization they are instructed to seek medical attention immediately and to alert the physician providing care that they have an aneurysm.  ?The patient voices their understanding. ?The patient will return in 1

## 2021-07-02 DIAGNOSIS — J449 Chronic obstructive pulmonary disease, unspecified: Secondary | ICD-10-CM | POA: Diagnosis not present

## 2021-07-08 ENCOUNTER — Encounter (INDEPENDENT_AMBULATORY_CARE_PROVIDER_SITE_OTHER): Payer: Self-pay | Admitting: Vascular Surgery

## 2021-07-29 DIAGNOSIS — J432 Centrilobular emphysema: Secondary | ICD-10-CM | POA: Diagnosis not present

## 2021-07-29 DIAGNOSIS — J449 Chronic obstructive pulmonary disease, unspecified: Secondary | ICD-10-CM | POA: Diagnosis not present

## 2021-08-04 ENCOUNTER — Encounter (INDEPENDENT_AMBULATORY_CARE_PROVIDER_SITE_OTHER): Payer: Self-pay | Admitting: Vascular Surgery

## 2021-08-04 ENCOUNTER — Ambulatory Visit (INDEPENDENT_AMBULATORY_CARE_PROVIDER_SITE_OTHER): Payer: PPO

## 2021-08-04 ENCOUNTER — Ambulatory Visit (INDEPENDENT_AMBULATORY_CARE_PROVIDER_SITE_OTHER): Payer: PPO | Admitting: Vascular Surgery

## 2021-08-04 VITALS — BP 156/67 | HR 65 | Resp 16 | Wt 124.0 lb

## 2021-08-04 DIAGNOSIS — I6523 Occlusion and stenosis of bilateral carotid arteries: Secondary | ICD-10-CM

## 2021-08-04 DIAGNOSIS — E785 Hyperlipidemia, unspecified: Secondary | ICD-10-CM

## 2021-08-04 DIAGNOSIS — I10 Essential (primary) hypertension: Secondary | ICD-10-CM

## 2021-08-04 DIAGNOSIS — I7143 Infrarenal abdominal aortic aneurysm, without rupture: Secondary | ICD-10-CM | POA: Diagnosis not present

## 2021-08-04 NOTE — Progress Notes (Signed)
? ? ? ? ?MRN : [id number] ? ?Lisa Wilkerson is a 76 y.o. ([birth year]) female who presents with chief complaint of check carotid arteries. ? ?History of Present Illness:  ? ?The patient is seen for follow up evaluation of carotid stenosis. The carotid stenosis followed by ultrasound.  ? ?The patient denies amaurosis fugax. There is no recent history of TIA symptoms or focal motor deficits. There is no prior documented CVA. ? ?The patient is taking enteric-coated aspirin 81 mg daily. ? ?There is no history of migraine headaches. There is no history of seizures. ? ?No recent shortening of the patient's walking distance or new symptoms consistent with claudication.  No history of rest pain symptoms. No new ulcers or wounds of the lower extremities have occurred. ? ?There is no history of DVT, PE or superficial thrombophlebitis. ?No recent episodes of angina or shortness of breath documented.  ? ?Duplex of the carotid arteries today shows less than 1-39% right ICA stenosis and a 50% common carotid stenosis proximal to the widely  patent left carotid endarterectomy.  ?  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 ?No outpatient medications have been marked as taking for the 08/04/21 encounter (Appointment) with Delana Meyer, Dolores Lory, MD.  ? ? ?Past Medical History:  ?Diagnosis Date  ? Actinic keratosis 10/29/2014  ? L hand thenar - biopsy proven  ? Actinic keratosis 08/17/2017  ? L forearm - biopsy proven  ? Arthritis   ? Basal cell carcinoma of skin   ? Chronic kidney disease   ? Stage 3 Chronic Kidney Disease  ? Coronary artery disease   ? Hyperlipidemia   ? Hypertension   ? Myocardial infarction ([place]) 07/2009  ? Peripheral vascular disease ([place])   ? Psoriasis   ? Sleep apnea   ? no CPAP  ? 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 L hand dorsum SCCIS  ? Squamous cell carcinoma of skin 11/23/2017  ? L mid forearm   ? Squamous cell  skin cancer 06/18/2014  ? L lat lower leg below the knee  ? TIA (transient ischemic attack) 2014  ? no deficits  ? Vertigo   ? no issues for several years  ? Wears dentures   ? full upper and lower  ? ? ?Past Surgical History:  ?Procedure Laterality Date  ? CARDIAC CATHETERIZATION  2011  ? ENDARTERECTOMY Left 11/28/2015  ? Procedure: ENDARTERECTOMY CAROTID;  Surgeon: Algernon Huxley, MD;  Location: [facility];  Service: Vascular;  Laterality: Left;  ? EYE SURGERY Bilateral   ? Cataract Extraction with IOL  ? MYRINGOTOMY WITH TUBE PLACEMENT Bilateral 08/19/2016  ? Procedure: MYRINGOTOMY WITH TUBE PLACEMENT;  Surgeon: Carloyn Manner, MD;  Location: [place];  Service: ENT;  Laterality: Bilateral;  sleep apnea  ? MYRINGOTOMY WITH TUBE PLACEMENT Bilateral 08/04/2017  ? Procedure: MYRINGOTOMY WITH TUBE PLACEMENT           BUTTERFLY TUBES;  Surgeon: Carloyn Manner, MD;  Location: [place];  Service: ENT;  Laterality: Bilateral;  ? UVULOPALATOPHARYNGOPLASTY  2000  ? ? ?Social History ?Social History  ? ?Tobacco Use  ? Smoking status: Every Day  ?  Packs/day: 1.00  ?  Years: 58.00  ?  Pack years: 58.00  ?  Types: Cigarettes  ? Smokeless tobacco: Never  ?Vaping Use  ? Vaping Use: Never used  ?Substance Use Topics  ?  Alcohol use: No  ? Drug use: No  ? ? ?Family History ?No family history on file. ? ?Allergies  ?Allergen Reactions  ? Codeine Other (See Comments)  ?  She states it paralyzes her and she can't move.  ? ? ? ?REVIEW OF SYSTEMS (Negative unless checked) ? ?Constitutional: '[]'$ Weight loss  '[]'$ Fever  '[]'$ Chills ?Cardiac: '[]'$ Chest pain   '[]'$ Chest pressure   '[]'$ Palpitations   '[]'$ Shortness of breath when laying flat   '[]'$ Shortness of breath with exertion. ?Vascular:  '[x]'$ Pain in legs with walking   '[]'$ Pain in legs at rest  '[]'$ History of DVT   '[]'$ Phlebitis   '[]'$ Swelling in legs   '[]'$ Varicose veins   '[]'$ Non-healing ulcers ?Pulmonary:   '[]'$ Uses home oxygen   '[]'$ Productive cough   '[]'$ Hemoptysis   '[]'$ Wheeze  '[]'$ COPD    '[]'$ Asthma ?Neurologic:  '[]'$ Dizziness   '[]'$ Seizures   '[]'$ History of stroke   '[]'$ History of TIA  '[]'$ Aphasia   '[]'$ Vissual changes   '[]'$ Weakness or numbness in arm   '[]'$ Weakness or numbness in leg ?Musculoskeletal:   '[]'$ Joint swelling   '[]'$ Joint pain   '[]'$ Low back pain ?Hematologic:  '[]'$ Easy bruising  '[]'$ Easy bleeding   '[]'$ Hypercoagulable state   '[]'$ Anemic ?Gastrointestinal:  '[]'$ Diarrhea   '[]'$ Vomiting  '[]'$ Gastroesophageal reflux/heartburn   '[]'$ Difficulty swallowing. ?Genitourinary:  '[]'$ Chronic kidney disease   '[]'$ Difficult urination  '[]'$ Frequent urination   '[]'$ Blood in urine ?Skin:  '[]'$ Rashes   '[]'$ Ulcers  ?Psychological:  '[]'$ History of anxiety   '[]'$  History of major depression. ? ?Physical Examination ? ?There were no vitals filed for this visit. ?There is no height or weight on file to calculate BMI. ?Gen: WD/WN, NAD ?Head: [place]/AT, No temporalis wasting.  ?Ear/Nose/Throat: Hearing grossly intact, nares w/o erythema or drainage ?Eyes: PER, EOMI, sclera nonicteric.  ?Neck: Supple, no masses.  No bruit or JVD.  ?Pulmonary:  Good air movement, no audible wheezing, no use of accessory muscles.  ?Cardiac: RRR, normal S1, S2, no Murmurs. ?Vascular:  carotid bruit noted ?Vessel Right Left  ?Radial Palpable Palpable  ?Carotid  Palpable  Palpable  ?DP  Palpable Palpable  ?PT Palpable  Palpable  ?Gastrointestinal: soft, non-distended. No guarding/no peritoneal signs.  ?Musculoskeletal: M/S 5/5 throughout.  No visible deformity.  ?Neurologic: CN 2-12 intact. Pain and light touch intact in extremities.  Symmetrical.  Speech is fluent. Motor exam as listed above. ?Psychiatric: Judgment intact, Mood & affect appropriate for pt's clinical situation. ?Dermatologic: No rashes or ulcers noted.  No changes consistent with cellulitis. ? ? ?CBC ?Lab Results  ?Component Value Date  ? WBC 5.4 02/07/2018  ? HGB 12.4 02/07/2018  ? HCT 38.8 02/07/2018  ? MCV 86.8 02/07/2018  ? PLT 254 02/07/2018  ? ? ?BMET ?   ?Component Value Date/Time  ? NA 137 02/07/2018 0541  ? K 3.8  02/07/2018 0541  ? CL 103 02/07/2018 0541  ? CO2 26 02/07/2018 0541  ? GLUCOSE 135 (H) 02/07/2018 0541  ? BUN 23 02/07/2018 0541  ? CREATININE 1.25 (H) 02/07/2018 0541  ? CALCIUM 8.5 (L) 02/07/2018 0541  ? GFRNONAA 42 (L) 02/07/2018 0541  ? GFRAA 49 (L) 02/07/2018 0541  ? ?CrCl cannot be calculated (Patient's most recent lab result is older than the maximum 21 days allowed.). ? ?COAG ?Lab Results  ?Component Value Date  ? INR 0.95 02/06/2018  ? INR 0.87 11/22/2015  ? ? ?Radiology ?No results found. ? ? ?Assessment/Plan ?1. Bilateral carotid artery stenosis ?Recommend: ? ?Given the patient's asymptomatic subcritical stenosis no further invasive testing or surgery at this  time. ? ?Duplex of the carotid arteries today shows less than 1-39% right ICA stenosis and a 50% common carotid stenosis proximal to the widely  patent left carotid endarterectomy.  ? ?Continue antiplatelet therapy as prescribed ?Continue management of CAD, HTN and Hyperlipidemia ?Healthy heart diet,  encouraged exercise at least 4 times per week ?Follow up in 12 months with duplex ultrasound and physical exam   ? ?- VAS US CAROTID; Future ? ?2. Infrarenal abdominal aortic aneurysm (AAA) without rupture ([place]) ?Recommend: ?No surgery or intervention is indicated at this time. ? ?The patient has an asymptomatic abdominal aortic aneurysm that is less than 4 cm in maximal diameter.   ? ?I have reviewed the natural history of abdominal aortic aneurysm and the small risk of rupture for aneurysm less than 5 cm in size. ? ?However, as these small aneurysms tend to enlarge over time, continued surveillance with ultrasound or CT scan is mandatory.  ? ?I have also discussed optimizing medical management with hypertension and lipid control and the importance of abstinence from tobacco.  The patient is also encouraged to exercise a minimum of 30 minutes 4 times a week.  ? ?Should the patient develop new onset abdominal or back pain or signs of peripheral  embolization they are instructed to seek medical attention immediately and to alert the physician providing care that they have an aneurysm.  ? ?The patient voices their understanding. ? ?The patient will return in 35 month

## 2021-08-06 ENCOUNTER — Encounter (INDEPENDENT_AMBULATORY_CARE_PROVIDER_SITE_OTHER): Payer: Self-pay | Admitting: Vascular Surgery

## 2021-08-21 DIAGNOSIS — J432 Centrilobular emphysema: Secondary | ICD-10-CM | POA: Diagnosis not present

## 2021-08-21 DIAGNOSIS — J449 Chronic obstructive pulmonary disease, unspecified: Secondary | ICD-10-CM | POA: Diagnosis not present

## 2021-08-27 DIAGNOSIS — J449 Chronic obstructive pulmonary disease, unspecified: Secondary | ICD-10-CM | POA: Diagnosis not present

## 2021-08-27 DIAGNOSIS — J432 Centrilobular emphysema: Secondary | ICD-10-CM | POA: Diagnosis not present

## 2021-09-15 DIAGNOSIS — F17200 Nicotine dependence, unspecified, uncomplicated: Secondary | ICD-10-CM | POA: Diagnosis not present

## 2021-09-15 DIAGNOSIS — J449 Chronic obstructive pulmonary disease, unspecified: Secondary | ICD-10-CM | POA: Diagnosis not present

## 2021-09-16 DIAGNOSIS — J449 Chronic obstructive pulmonary disease, unspecified: Secondary | ICD-10-CM | POA: Diagnosis not present

## 2021-09-16 DIAGNOSIS — J432 Centrilobular emphysema: Secondary | ICD-10-CM | POA: Diagnosis not present

## 2021-09-26 DIAGNOSIS — E782 Mixed hyperlipidemia: Secondary | ICD-10-CM | POA: Diagnosis not present

## 2021-09-26 DIAGNOSIS — N184 Chronic kidney disease, stage 4 (severe): Secondary | ICD-10-CM | POA: Diagnosis not present

## 2021-09-26 DIAGNOSIS — D638 Anemia in other chronic diseases classified elsewhere: Secondary | ICD-10-CM | POA: Diagnosis not present

## 2021-09-26 DIAGNOSIS — R7303 Prediabetes: Secondary | ICD-10-CM | POA: Diagnosis not present

## 2021-10-03 DIAGNOSIS — F17200 Nicotine dependence, unspecified, uncomplicated: Secondary | ICD-10-CM | POA: Diagnosis not present

## 2021-10-03 DIAGNOSIS — E782 Mixed hyperlipidemia: Secondary | ICD-10-CM | POA: Diagnosis not present

## 2021-10-03 DIAGNOSIS — R7303 Prediabetes: Secondary | ICD-10-CM | POA: Diagnosis not present

## 2021-10-03 DIAGNOSIS — I10 Essential (primary) hypertension: Secondary | ICD-10-CM | POA: Diagnosis not present

## 2021-10-03 DIAGNOSIS — Z0000 Encounter for general adult medical examination without abnormal findings: Secondary | ICD-10-CM | POA: Diagnosis not present

## 2021-10-03 DIAGNOSIS — N184 Chronic kidney disease, stage 4 (severe): Secondary | ICD-10-CM | POA: Diagnosis not present

## 2021-10-16 DIAGNOSIS — J432 Centrilobular emphysema: Secondary | ICD-10-CM | POA: Diagnosis not present

## 2021-10-16 DIAGNOSIS — J449 Chronic obstructive pulmonary disease, unspecified: Secondary | ICD-10-CM | POA: Diagnosis not present

## 2021-11-10 DIAGNOSIS — J432 Centrilobular emphysema: Secondary | ICD-10-CM | POA: Diagnosis not present

## 2021-11-10 DIAGNOSIS — J449 Chronic obstructive pulmonary disease, unspecified: Secondary | ICD-10-CM | POA: Diagnosis not present

## 2021-11-12 DIAGNOSIS — N2581 Secondary hyperparathyroidism of renal origin: Secondary | ICD-10-CM | POA: Diagnosis not present

## 2021-11-12 DIAGNOSIS — I10 Essential (primary) hypertension: Secondary | ICD-10-CM | POA: Diagnosis not present

## 2021-11-12 DIAGNOSIS — R809 Proteinuria, unspecified: Secondary | ICD-10-CM | POA: Diagnosis not present

## 2021-11-12 DIAGNOSIS — N1832 Chronic kidney disease, stage 3b: Secondary | ICD-10-CM | POA: Diagnosis not present

## 2021-11-18 DIAGNOSIS — N179 Acute kidney failure, unspecified: Secondary | ICD-10-CM | POA: Diagnosis not present

## 2021-11-18 DIAGNOSIS — N1832 Chronic kidney disease, stage 3b: Secondary | ICD-10-CM | POA: Diagnosis not present

## 2021-11-18 DIAGNOSIS — I129 Hypertensive chronic kidney disease with stage 1 through stage 4 chronic kidney disease, or unspecified chronic kidney disease: Secondary | ICD-10-CM | POA: Diagnosis not present

## 2021-11-18 DIAGNOSIS — N2581 Secondary hyperparathyroidism of renal origin: Secondary | ICD-10-CM | POA: Diagnosis not present

## 2021-12-02 ENCOUNTER — Other Ambulatory Visit: Payer: Self-pay

## 2021-12-02 DIAGNOSIS — Z87891 Personal history of nicotine dependence: Secondary | ICD-10-CM

## 2021-12-02 DIAGNOSIS — F17210 Nicotine dependence, cigarettes, uncomplicated: Secondary | ICD-10-CM

## 2021-12-02 DIAGNOSIS — Z122 Encounter for screening for malignant neoplasm of respiratory organs: Secondary | ICD-10-CM

## 2021-12-04 DIAGNOSIS — J449 Chronic obstructive pulmonary disease, unspecified: Secondary | ICD-10-CM | POA: Diagnosis not present

## 2021-12-04 DIAGNOSIS — J432 Centrilobular emphysema: Secondary | ICD-10-CM | POA: Diagnosis not present

## 2021-12-18 DIAGNOSIS — I259 Chronic ischemic heart disease, unspecified: Secondary | ICD-10-CM | POA: Diagnosis not present

## 2021-12-18 DIAGNOSIS — Z9989 Dependence on other enabling machines and devices: Secondary | ICD-10-CM | POA: Diagnosis not present

## 2021-12-18 DIAGNOSIS — F17200 Nicotine dependence, unspecified, uncomplicated: Secondary | ICD-10-CM | POA: Diagnosis not present

## 2021-12-18 DIAGNOSIS — I10 Essential (primary) hypertension: Secondary | ICD-10-CM | POA: Diagnosis not present

## 2021-12-18 DIAGNOSIS — G4733 Obstructive sleep apnea (adult) (pediatric): Secondary | ICD-10-CM | POA: Diagnosis not present

## 2021-12-18 DIAGNOSIS — I2510 Atherosclerotic heart disease of native coronary artery without angina pectoris: Secondary | ICD-10-CM | POA: Diagnosis not present

## 2021-12-18 DIAGNOSIS — E782 Mixed hyperlipidemia: Secondary | ICD-10-CM | POA: Diagnosis not present

## 2021-12-22 DIAGNOSIS — G4733 Obstructive sleep apnea (adult) (pediatric): Secondary | ICD-10-CM | POA: Diagnosis not present

## 2021-12-22 DIAGNOSIS — J449 Chronic obstructive pulmonary disease, unspecified: Secondary | ICD-10-CM | POA: Diagnosis not present

## 2021-12-22 DIAGNOSIS — F17218 Nicotine dependence, cigarettes, with other nicotine-induced disorders: Secondary | ICD-10-CM | POA: Diagnosis not present

## 2021-12-23 ENCOUNTER — Ambulatory Visit (INDEPENDENT_AMBULATORY_CARE_PROVIDER_SITE_OTHER): Payer: PPO | Admitting: Acute Care

## 2021-12-23 ENCOUNTER — Encounter: Payer: Self-pay | Admitting: Acute Care

## 2021-12-23 DIAGNOSIS — F17210 Nicotine dependence, cigarettes, uncomplicated: Secondary | ICD-10-CM

## 2021-12-23 NOTE — Progress Notes (Signed)
Virtual Visit via Telephone Note  I connected with Lisa Wilkerson on 02/18/21 at  2:00 PM EST by telephone and verified that I am speaking with the correct person using two identifiers.  Location: Patient: Home Provider: Working from home   I discussed the limitations, risks, security and privacy concerns of performing an evaluation and management service by telephone and the availability of in person appointments. I also discussed with the patient that there may be a patient responsible charge related to this service. The patient expressed understanding and agreed to proceed.  Shared Decision Making Visit Lung Cancer Screening Program [phone])   Eligibility: Age 76 y.o. Pack Years Smoking History Calculation 72 (# packs/per year x # years smoked) Recent History of coughing up blood  no Unexplained weight loss? no ( >Than 15 pounds within the last 6 months ) Prior History Lung / other cancer no (Diagnosis within the last 5 years already requiring surveillance chest CT Scans). Smoking Status Current Smoker Former Smokers: Years since quit: NA  Quit Date: NA  Visit Components: Discussion included one or more decision making aids. yes Discussion included risk/benefits of screening. yes Discussion included potential follow up diagnostic testing for abnormal scans. yes Discussion included meaning and risk of over diagnosis. yes Discussion included meaning and risk of False Positives. yes Discussion included meaning of total radiation exposure. yes  Counseling Included: Importance of adherence to annual lung cancer LDCT screening. yes Impact of comorbidities on ability to participate in the program. yes Ability and willingness to under diagnostic treatment. yes  Smoking Cessation Counseling: Current Smokers:  Discussed importance of smoking cessation. yes Information about tobacco cessation classes and interventions provided to patient. yes Patient provided with "ticket" for  LDCT Scan. yes Symptomatic Patient. yes  Counseling(Intermediate counseling: > three minutes) 99406 Diagnosis Code: Tobacco Use Z72.0 Asymptomatic Patient no  Counseling NA Former Smokers:  Discussed the importance of maintaining cigarette abstinence. yes Diagnosis Code: Personal History of Nicotine Dependence. O27.035 Information about tobacco cessation classes and interventions provided to patient. Yes Patient provided with "ticket" for LDCT Scan. yes Written Order for Lung Cancer Screening with LDCT placed in Epic. Yes (CT Chest Lung Cancer Screening Low Dose W/O CM) [id number] Z12.2-Screening of respiratory organs Z87.891-Personal history of nicotine dependence   I spent 25 minutes of face to face time with her discussing the risks and benefits of lung cancer screening. We viewed a power point together that explained in detail the above noted topics. We took the time to pause the power point at intervals to allow for questions to be asked and answered to ensure understanding. We discussed that she had taken the single most powerful action possible to decrease her risk of developing lung cancer when she quit smoking. I counseled her to remain smoke free, and to contact me if she ever had the desire to smoke again so that I can provide resources and tools to help support the effort to remain smoke free. We discussed the time and location of the scan, and that either  Doroteo Glassman RN or I will call with the results within  24-48 hours of receiving them. she has my card and contact information in the event she needs to speak with me, in addition to a copy of the power point we reviewed as a resource. She verbalized understanding of all of the above and had no further questions upon leaving the office.     I explained to the patient that there has been a  high incidence of coronary artery disease noted on these exams. I explained that this is a non-gated exam therefore degree or severity cannot be  determined. This patient is on statin therapy. I have asked the patient to follow-up with their PCP regarding any incidental finding of coronary artery disease and management with diet or medication as they feel is clinically indicated. The patient verbalized understanding of the above and had no further questions.    I spent 3 minutes counseling on smoking cessation and the health risks of continued tobacco abuse    Advaith Lamarque D. Kenton Kingfisher, NP-C  Pulmonary & Critical Care Personal contact information can be found on Amion  12/23/2021, 11:31 AM

## 2021-12-23 NOTE — Patient Instructions (Addendum)
Thank you for participating in the [facility]. It was our pleasure to meet you today. We will call you with the results of your scan within the next few days. Your scan will be assigned a Lung RADS category score by the physicians reading the scans.  This Lung RADS score determines follow up scanning.  See below for description of categories, and follow up screening recommendations. We will be in touch to schedule your follow up screening annually or based on recommendations of our providers. We will fax a copy of your scan results to your Primary Care Physician, or the physician who referred you to the program, to ensure they have the results. Please call the office if you have any questions or concerns regarding your scanning experience or results.  Our office number is [phone]. Please speak with Denise Phelps, RN. , or  Denise Buckner RN, They are  our Lung Cancer Screening RN.'s If They are unavailable when you call, Please leave a message on the voice mail. We will return your call at our earliest convenience.This voice mail is monitored several times a day.  Remember, if your scan is normal, we will scan you annually as long as you continue to meet the criteria for the program. (Age 55-77, Current smoker or smoker who has quit within the last 15 years). If you are a smoker, remember, quitting is the single most powerful action that you can take to decrease your risk of lung cancer and other pulmonary, breathing related problems. We know quitting is hard, and we are here to help.  Please let us know if there is anything we can do to help you meet your goal of quitting. If you are a former smoker, congratulations. We are proud of you! Remain smoke free! Remember you can refer friends or family members through the number above.  We will screen them to make sure they meet criteria for the program. Thank you for helping us take better care of you by  participating in Lung Screening.  You can receive free nicotine replacement therapy ( patches, gum or mints) by calling 1-800-QUIT NOW. Please call so we can get you on the path to becoming  a non-smoker. I know it is hard, but you can do this!  Lung RADS Categories:  Lung RADS 1: no nodules or definitely non-concerning nodules.  Recommendation is for a repeat annual scan in 12 months.  Lung RADS 2:  nodules that are non-concerning in appearance and behavior with a very low likelihood of becoming an active cancer. Recommendation is for a repeat annual scan in 12 months.  Lung RADS 3: nodules that are probably non-concerning , includes nodules with a low likelihood of becoming an active cancer.  Recommendation is for a 6-month repeat screening scan. Often noted after an upper respiratory illness. We will be in touch to make sure you have no questions, and to schedule your 6-month scan.  Lung RADS 4 A: nodules with concerning findings, recommendation is most often for a follow up scan in 3 months or additional testing based on our provider's assessment of the scan. We will be in touch to make sure you have no questions and to schedule the recommended 3 month follow up scan.  Lung RADS 4 B:  indicates findings that are concerning. We will be in touch with you to schedule additional diagnostic testing based on our provider's  assessment of the scan.  Other options for assistance in smoking cessation (   As covered by your insurance benefits)  Hypnosis for smoking cessation  [employer] [phone]  Acupuncture for smoking cessation  [facility] [phone]   

## 2021-12-24 ENCOUNTER — Ambulatory Visit
Admission: RE | Admit: 2021-12-24 | Discharge: 2021-12-24 | Disposition: A | Discharge status: Home / Self Care | Payer: PPO | Source: Ambulatory Visit | Attending: Acute Care | Admitting: Acute Care

## 2021-12-24 DIAGNOSIS — Z122 Encounter for screening for malignant neoplasm of respiratory organs: Secondary | ICD-10-CM | POA: Insufficient documentation

## 2021-12-24 DIAGNOSIS — F17210 Nicotine dependence, cigarettes, uncomplicated: Secondary | ICD-10-CM | POA: Diagnosis not present

## 2021-12-24 DIAGNOSIS — Z87891 Personal history of nicotine dependence: Secondary | ICD-10-CM | POA: Insufficient documentation

## 2021-12-25 ENCOUNTER — Telehealth: Payer: Self-pay | Admitting: Acute Care

## 2021-12-25 ENCOUNTER — Other Ambulatory Visit: Payer: Self-pay | Admitting: Acute Care

## 2021-12-25 DIAGNOSIS — R911 Solitary pulmonary nodule: Secondary | ICD-10-CM

## 2021-12-25 NOTE — Telephone Encounter (Signed)
Lisa Wilkerson is calling to give a report for CT scan.  Please call at [phone]

## 2021-12-25 NOTE — Telephone Encounter (Signed)
I have called the patient with the results of her low dose CT Chest. The scan was read as a Lung RADS 4 A : suspicious findings, either short term follow up in 3 months or alternatively  PET Scan evaluation may be considered when there is a solid component of  8 mm or larger.  I explained that the patient has a 9.6 mm nodule in the left upper lobe , in addition to Right paratracheal lymph node on 23/2 measures up to 1.4 cm short axis. Calcified 1.4 cm prevascular node on 26/2 , and  Calcified tissue seen elsewhere in the mediastinum and both hilar regions. I explained that I have spoken with Dr. Patsey Berthold from [facility] in [place], and that she feels a PET scan will be the next step in getting a better look at the nodule to determine if there is need for biopsy. The patient is in agreement with a PET scan. I have placed the order.She understands she will get a call to get this scheduled. She will then follow up with Dr. Patsey Berthold after the scan to review th results and determine next best steps for care.  Weihe Gauss, the patient has had a US of the abdominal aorta within the year. This is being followed by a vascular specialist.  Please fax results to PCP and let them know plan is for a PET scan. Keep on tickle list in the event this is not a malignancy and she will need to return to the screening program. Thanks so much Margie, will you watch for PET scan date and set her up for follow up with Dr. Darnell Level?  Thanks

## 2021-12-25 NOTE — Telephone Encounter (Signed)
Will call through [facility]

## 2021-12-25 NOTE — Progress Notes (Signed)
See telephone note dated 12/25/2021

## 2021-12-25 NOTE — Telephone Encounter (Signed)
Results/plan faxed to PCP 

## 2021-12-25 NOTE — Telephone Encounter (Signed)
Forwarded information to Dr. Teodoro Kil office and primary care.

## 2021-12-25 NOTE — Telephone Encounter (Signed)
Patient is a patient of Dr. Lanney Gins.

## 2021-12-25 NOTE — Telephone Encounter (Signed)
Called and spoke with Malachy Mood at [facility] for a call report for patient's lung cancer screening CT.   IMPRESSION: 1. Lung-RADS 4A, suspicious. 9.6 mm left upper lobe pulmonary nodule. Follow up low-dose chest CT without contrast in 3 months (please use the following order, "CT CHEST LCS NODULE FOLLOW-UP W/O CM") is recommended. Alternatively, PET may be considered when there is a solid component 68m or larger. 2. Calcified lymphadenopathy in the mediastinum, hila, and axillary regions. These are new since neck CTA of 12/02/2015. While sarcoid or granulomatous disease would be considerations, close follow-up will be required as metastatic disease not excluded. 3. Incomplete visualization of the abdominal aorta which measures up to at least 4.4 cm diameter consistent with aneurysm. Consider abdominopelvic CT or abdominal ultrasound to further evaluate establish baseline for follow-up recommendations. 4.  Emphysema (ICD10-J43.9) and Aortic Atherosclerosis (ICD10-170.0)  Will route to SJudson Rochand the lung nodule pool.

## 2021-12-31 DIAGNOSIS — J449 Chronic obstructive pulmonary disease, unspecified: Secondary | ICD-10-CM | POA: Diagnosis not present

## 2021-12-31 DIAGNOSIS — G4733 Obstructive sleep apnea (adult) (pediatric): Secondary | ICD-10-CM | POA: Diagnosis not present

## 2022-01-07 ENCOUNTER — Encounter: Payer: Self-pay | Admitting: Dermatology

## 2022-01-07 ENCOUNTER — Ambulatory Visit: Payer: PPO | Admitting: Dermatology

## 2022-01-07 DIAGNOSIS — L578 Other skin changes due to chronic exposure to nonionizing radiation: Secondary | ICD-10-CM

## 2022-01-07 DIAGNOSIS — D492 Neoplasm of unspecified behavior of bone, soft tissue, and skin: Secondary | ICD-10-CM

## 2022-01-07 DIAGNOSIS — C44622 Squamous cell carcinoma of skin of right upper limb, including shoulder: Secondary | ICD-10-CM | POA: Diagnosis not present

## 2022-01-07 NOTE — Progress Notes (Signed)
   Follow-Up Visit   Subjective  Lisa Wilkerson is a 76 y.o. female who presents for the following: lesion (Right shoulder. Dur: couple of months. Growing fast. Tender at times).  The patient has spots, moles and lesions to be evaluated, some may be new or changing and the patient has concerns that these could be cancer.   The following portions of the chart were reviewed this encounter and updated as appropriate:      Review of Systems: No other skin or systemic complaints except as noted in HPI or Assessment and Plan.   Objective  Well appearing patient in no apparent distress; mood and affect are within normal limits.  A focused examination was performed including right upper arm. Relevant physical exam findings are noted in the Assessment and Plan.  Right Shoulder - Posterior 1.6cm pink firm scaly nodule      Assessment & Plan  Neoplasm of skin Right Shoulder - Posterior  Epidermal / dermal shaving  Lesion diameter (cm):  1.6 Informed consent: discussed and consent obtained   Patient was prepped and draped in usual sterile fashion: Area prepped with alcohol. Anesthesia: the lesion was anesthetized in a standard fashion   Anesthetic:  1% lidocaine w/ epinephrine 1-100,000 buffered w/ 8.4% NaHCO3 Instrument used: flexible razor blade   Hemostasis achieved with: pressure, aluminum chloride and electrodesiccation   Outcome: patient tolerated procedure well    Destruction of lesion  Destruction method: electrodesiccation and curettage   Timeout:  patient name, date of birth, surgical site, and procedure verified Curettage performed in three different directions: Yes   Electrodesiccation performed over the curetted area: Yes   Final wound size (cm):  1.7 Hemostasis achieved with:  pressure, aluminum chloride and electrodesiccation Outcome: patient tolerated procedure well with no complications   Post-procedure details: wound care instructions given   Additional  details:  Mupirocin ointment and Bandaid applied   Specimen 1 - Surgical pathology Differential Diagnosis: R/O SCC  Check Margins: No  Actinic Damage - chronic, secondary to cumulative UV radiation exposure/sun exposure over time - diffuse scaly erythematous macules with underlying dyspigmentation - Recommend daily broad spectrum sunscreen SPF 30+ to sun-exposed areas, reapply every 2 hours as needed.  - Recommend staying in the shade or wearing long sleeves, sun glasses (UVA+UVB protection) and wide brim hats (4-inch brim around the entire circumference of the hat). - Call for new or changing lesions.   Return for Psoriasis Follow Up As Scheduled.  I, Emelia Salisbury, CMA, am acting as scribe for Brendolyn Patty, MD.  Documentation: I have reviewed the above documentation for accuracy and completeness, and I agree with the above.  Brendolyn Patty MD

## 2022-01-07 NOTE — Patient Instructions (Signed)
Electrodesiccation and Curettage ("Scrape and Burn") Wound Care Instructions  Leave the original bandage on for 24 hours if possible.  If the bandage becomes soaked or soiled before that time, it is OK to remove it and examine the wound.  A small amount of post-operative bleeding is normal.  If excessive bleeding occurs, remove the bandage, place gauze over the site and apply continuous pressure (no peeking) over the area for 30 minutes. If this does not work, please call our clinic as soon as possible or page your doctor if it is after hours.   Once a day, cleanse the wound with soap and water. It is fine to shower. If a thick crust develops you may use a Q-tip dipped into dilute hydrogen peroxide (mix 1:1 with water) to dissolve it.  Hydrogen peroxide can slow the healing process, so use it only as needed.    After washing, apply petroleum jelly (Vaseline) or an antibiotic ointment if your doctor prescribed one for you, followed by a bandage.    For best healing, the wound should be covered with a layer of ointment at all times. If you are not able to keep the area covered with a bandage to hold the ointment in place, this may mean re-applying the ointment several times a day.  Continue this wound care until the wound has healed and is no longer open. It may take several weeks for the wound to heal and close.  Itching and mild discomfort is normal during the healing process.  If you have any discomfort, you can take Tylenol (acetaminophen) or ibuprofen as directed on the bottle. (Please do not take these if you have an allergy to them or cannot take them for another reason).  Some redness, tenderness and white or yellow material in the wound is normal healing.  If the area becomes very sore and red, or develops a thick yellow-green material (pus), it may be infected; please notify us.    Wound healing continues for up to one year following surgery. It is not unusual to experience pain in the scar  from time to time during the interval.  If the pain becomes severe or the scar thickens, you should notify the office.    A slight amount of redness in a scar is expected for the first six months.  After six months, the redness will fade and the scar will soften and fade.  The color difference becomes less noticeable with time.  If there are any problems, return for a post-op surgery check at your earliest convenience.  To improve the appearance of the scar, you can use silicone scar gel, cream, or sheets (such as Mederma or Serica) every night for up to one year. These are available over the counter (without a prescription).  Please call our office at [phone] for any questions or concerns.     Due to recent changes in healthcare laws, you may see results of your pathology and/or laboratory studies on MyChart before the doctors have had a chance to review them. We understand that in some cases there may be results that are confusing or concerning to you. Please understand that not all results are received at the same time and often the doctors may need to interpret multiple results in order to provide you with the best plan of care or course of treatment. Therefore, we ask that you please give us 2 business days to thoroughly review all your results before contacting the office for clarification. Should   we see a critical lab result, you will be contacted sooner.   If You Need Anything After Your Visit  If you have any questions or concerns for your doctor, please call our main line at [phone] and press option 4 to reach your doctor's medical assistant. If no one answers, please leave a voicemail as directed and we will return your call as soon as possible. Messages left after 4 pm will be answered the following business day.   You may also send us a message via MyChart. We typically respond to MyChart messages within 1-2 business days.  For prescription refills, please ask your  pharmacy to contact our office. Our fax number is [phone].  If you have an urgent issue when the clinic is closed that cannot wait until the next business day, you can page your doctor at the number below.    Please note that while we do our best to be available for urgent issues outside of office hours, we are not available 24/7.   If you have an urgent issue and are unable to reach us, you may choose to seek medical care at your doctor's office, retail clinic, urgent care center, or emergency room.  If you have a medical emergency, please immediately call 911 or go to the emergency department.  Pager Numbers  - Dr. Kowalski: [phone]  - Dr. Moye: [phone]  - Dr. Stewart: [phone]  In the event of inclement weather, please call our main line at [phone] for an update on the status of any delays or closures.  Dermatology Medication Tips: Please keep the boxes that topical medications come in in order to help keep track of the instructions about where and how to use these. Pharmacies typically print the medication instructions only on the boxes and not directly on the medication tubes.   If your medication is too expensive, please contact our office at [phone] option 4 or send us a message through MyChart.   We are unable to tell what your co-pay for medications will be in advance as this is different depending on your insurance coverage. However, we may be able to find a substitute medication at lower cost or fill out paperwork to get insurance to cover a needed medication.   If a prior authorization is required to get your medication covered by your insurance company, please allow us 1-2 business days to complete this process.  Drug prices often vary depending on where the prescription is filled and some pharmacies may offer cheaper prices.  The website www.goodrx.com contains coupons for medications through different pharmacies. The prices here do not  account for what the cost may be with help from insurance (it may be cheaper with your insurance), but the website can give you the price if you did not use any insurance.  - You can print the associated coupon and take it with your prescription to the pharmacy.  - You may also stop by our office during regular business hours and pick up a GoodRx coupon card.  - If you need your prescription sent electronically to a different pharmacy, notify our office through [facility] MyChart or by phone at [phone] option 4.     Si Usted Necesita Algo Despus de Su Visita  Tambin puede enviarnos un mensaje a travs de MyChart. Por lo general respondemos a los mensajes de MyChart en el transcurso de 1 a 2 das hbiles.  Para renovar recetas, por favor pida a su farmacia que se ponga en   contacto con nuestra oficina. Nuestro nmero de fax es el [phone].  Si tiene un asunto urgente cuando la clnica est cerrada y que no puede esperar hasta el siguiente da hbil, puede llamar/localizar a su doctor(a) al nmero que aparece a continuacin.   Por favor, tenga en cuenta que aunque hacemos todo lo posible para estar disponibles para asuntos urgentes fuera del horario de oficina, no estamos disponibles las 24 horas del da, los 7 das de la semana.   Si tiene un problema urgente y no puede comunicarse con nosotros, puede optar por buscar atencin mdica  en el consultorio de su doctor(a), en una clnica privada, en un centro de atencin urgente o en una sala de emergencias.  Si tiene una emergencia mdica, por favor llame inmediatamente al 911 o vaya a la sala de emergencias.  Nmeros de bper  - Dr. Kowalski: [phone]  - Dra. Moye: [phone]  - Dra. Stewart: [phone]  En caso de inclemencias del tiempo, por favor llame a nuestra lnea principal al [phone] para una actualizacin sobre el estado de cualquier retraso o cierre.  Consejos para la medicacin en dermatologa: Por  favor, guarde las cajas en las que vienen los medicamentos de uso tpico para ayudarle a seguir las instrucciones sobre dnde y cmo usarlos. Las farmacias generalmente imprimen las instrucciones del medicamento slo en las cajas y no directamente en los tubos del medicamento.   Si su medicamento es muy caro, por favor, pngase en contacto con nuestra oficina llamando al [phone] y presione la opcin 4 o envenos un mensaje a travs de MyChart.   No podemos decirle cul ser su copago por los medicamentos por adelantado ya que esto es diferente dependiendo de la cobertura de su seguro. Sin embargo, es posible que podamos encontrar un medicamento sustituto a menor costo o llenar un formulario para que el seguro cubra el medicamento que se considera necesario.   Si se requiere una autorizacin previa para que su compaa de seguros cubra su medicamento, por favor permtanos de 1 a 2 das hbiles para completar este proceso.  Los precios de los medicamentos varan con frecuencia dependiendo del lugar de dnde se surte la receta y alguna farmacias pueden ofrecer precios ms baratos.  El sitio web www.goodrx.com tiene cupones para medicamentos de diferentes farmacias. Los precios aqu no tienen en cuenta lo que podra costar con la ayuda del seguro (puede ser ms barato con su seguro), pero el sitio web puede darle el precio si no utiliz ningn seguro.  - Puede imprimir el cupn correspondiente y llevarlo con su receta a la farmacia.  - Tambin puede pasar por nuestra oficina durante el horario de atencin regular y recoger una tarjeta de cupones de GoodRx.  - Si necesita que su receta se enve electrnicamente a una farmacia diferente, informe a nuestra oficina a travs de MyChart de [facility] o por telfono llamando al [phone] y presione la opcin 4.  

## 2022-01-12 ENCOUNTER — Encounter: Admission: RE | Admit: 2022-01-12 | Payer: PPO | Source: Ambulatory Visit

## 2022-01-12 ENCOUNTER — Telehealth: Payer: Self-pay

## 2022-01-12 NOTE — Telephone Encounter (Signed)
-----   Message from Brendolyn Patty, MD sent at 01/12/2022  1:04 PM EDT ----- Skin (M), right shoulder posterior SQUAMOUS CELL CARCINOMA, KERATOACANTHOMA TYPE  SCC skin cancer- already treated with EDC at time of biopsy    - please call patient

## 2022-01-12 NOTE — Telephone Encounter (Signed)
Advised pt of bx results/sh ?

## 2022-01-14 DIAGNOSIS — N1832 Chronic kidney disease, stage 3b: Secondary | ICD-10-CM | POA: Diagnosis not present

## 2022-01-14 DIAGNOSIS — N179 Acute kidney failure, unspecified: Secondary | ICD-10-CM | POA: Diagnosis not present

## 2022-01-16 ENCOUNTER — Ambulatory Visit
Admission: RE | Admit: 2022-01-16 | Discharge: 2022-01-16 | Disposition: A | Discharge status: Home / Self Care | Payer: PPO | Source: Ambulatory Visit | Attending: Acute Care | Admitting: Acute Care

## 2022-01-16 DIAGNOSIS — Z122 Encounter for screening for malignant neoplasm of respiratory organs: Secondary | ICD-10-CM | POA: Insufficient documentation

## 2022-01-16 DIAGNOSIS — I898 Other specified noninfective disorders of lymphatic vessels and lymph nodes: Secondary | ICD-10-CM | POA: Insufficient documentation

## 2022-01-16 DIAGNOSIS — I7143 Infrarenal abdominal aortic aneurysm, without rupture: Secondary | ICD-10-CM | POA: Insufficient documentation

## 2022-01-16 DIAGNOSIS — R911 Solitary pulmonary nodule: Secondary | ICD-10-CM | POA: Insufficient documentation

## 2022-01-16 LAB — GLUCOSE, CAPILLARY: Glucose-Capillary: 91 mg/dL (ref 70–99)

## 2022-01-16 MED ORDER — FLUDEOXYGLUCOSE F - 18 (FDG) INJECTION
6.7700 | Freq: Once | INTRAVENOUS | Status: AC
  Administered 2022-01-16: 6.77 via INTRAVENOUS

## 2022-01-19 DIAGNOSIS — I10 Essential (primary) hypertension: Secondary | ICD-10-CM | POA: Diagnosis not present

## 2022-01-19 DIAGNOSIS — R809 Proteinuria, unspecified: Secondary | ICD-10-CM | POA: Diagnosis not present

## 2022-01-19 DIAGNOSIS — N184 Chronic kidney disease, stage 4 (severe): Secondary | ICD-10-CM | POA: Diagnosis not present

## 2022-01-19 DIAGNOSIS — I129 Hypertensive chronic kidney disease with stage 1 through stage 4 chronic kidney disease, or unspecified chronic kidney disease: Secondary | ICD-10-CM | POA: Diagnosis not present

## 2022-01-20 DIAGNOSIS — K119 Disease of salivary gland, unspecified: Secondary | ICD-10-CM | POA: Diagnosis not present

## 2022-01-21 ENCOUNTER — Telehealth: Payer: Self-pay | Admitting: Acute Care

## 2022-01-21 DIAGNOSIS — R911 Solitary pulmonary nodule: Secondary | ICD-10-CM

## 2022-01-21 DIAGNOSIS — Z87891 Personal history of nicotine dependence: Secondary | ICD-10-CM

## 2022-01-21 NOTE — Telephone Encounter (Signed)
I have called the patient with the results of her PET scan. I explained that her scan showed that the previously demonstrated part solid left upper lobe pulmonary nodule is not hypermetabolic, favoring a benign etiology. Low-grade adenocarcinoma is not completely excluded, and follow-up chest CT in 6 months recommended.I told her we will call her closer to the time to get this scan scheduled.   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  This had significantly enlarged compared to prior CT of the head neck.  Recommendation was for referral to ENT. The patient saw Dr. Lanney Gins on 1017.  He reviewed the scan with her and he has referred her to ENT for follow-up of this finding.  She will be seeing Dr. Jeral Pinch.  She does not have a scheduled appointment as of yet but expects a phone call within the next day or so.  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  She is followed by Dr. Ronalee Belts at [facility].  We will make sure he receives a copy of the scan so he can follow-up as he feels is clinically indicated.   Denise please place order for 58-monthlow-dose CT follow-up.  And fax results to Dr. CJeral Pinchand Dr[facility], and the patient's primary care provider. .   Pt. Is in agreement with the above and had no further questions at completion of the call.

## 2022-01-22 NOTE — Telephone Encounter (Signed)
Order placed for 6 month follow up CT scan. Pet results faxed to Dr Netty Starring, Dr Carloyn Manner, and Dr Rica Koyanagi with follow up plans included.

## 2022-01-28 DIAGNOSIS — H6981 Other specified disorders of Eustachian tube, right ear: Secondary | ICD-10-CM | POA: Diagnosis not present

## 2022-01-28 DIAGNOSIS — D37030 Neoplasm of uncertain behavior of the parotid salivary glands: Secondary | ICD-10-CM | POA: Diagnosis not present

## 2022-01-30 ENCOUNTER — Other Ambulatory Visit: Payer: Self-pay | Admitting: Otolaryngology

## 2022-01-30 DIAGNOSIS — J432 Centrilobular emphysema: Secondary | ICD-10-CM | POA: Diagnosis not present

## 2022-01-30 DIAGNOSIS — J449 Chronic obstructive pulmonary disease, unspecified: Secondary | ICD-10-CM | POA: Diagnosis not present

## 2022-01-30 DIAGNOSIS — K118 Other diseases of salivary glands: Secondary | ICD-10-CM

## 2022-02-02 ENCOUNTER — Ambulatory Visit: Payer: PPO | Admitting: Dermatology

## 2022-02-02 ENCOUNTER — Encounter (INDEPENDENT_AMBULATORY_CARE_PROVIDER_SITE_OTHER): Payer: Self-pay

## 2022-02-02 DIAGNOSIS — L409 Psoriasis, unspecified: Secondary | ICD-10-CM

## 2022-02-02 DIAGNOSIS — Z85828 Personal history of other malignant neoplasm of skin: Secondary | ICD-10-CM

## 2022-02-02 DIAGNOSIS — Z79899 Other long term (current) drug therapy: Secondary | ICD-10-CM

## 2022-02-02 DIAGNOSIS — L570 Actinic keratosis: Secondary | ICD-10-CM

## 2022-02-02 MED ORDER — SKYRIZI PEN 150 MG/ML ~~LOC~~ SOAJ: 150.0000 mg | SUBCUTANEOUS | 0 refills | Status: DC

## 2022-02-02 NOTE — Progress Notes (Signed)
Follow-Up Visit   Subjective  Lisa Wilkerson is a 76 y.o. female who presents for the following: Psoriasis (6 months f/u on psoriasis, treating with Skyrizi injections q 12 weeks, Clobetasol cream/ointment with a good response ) and Skin Cancer (Recheck the right shoulder SCC removed and treated with [facility] 01/07/2022).   The following portions of the chart were reviewed this encounter and updated as appropriate:       Review of Systems:  No other skin or systemic complaints except as noted in HPI or Assessment and Plan.  Objective  Well appearing patient in no apparent distress; mood and affect are within normal limits.  A focused examination was performed including trunk, extremities. Relevant physical exam findings are noted in the Assessment and Plan.  trunk, extremities Pink scaly plaque on the right hip, bilateral elbows, with thinning  left wrist x 1, right ear helix x 1   (2) (2) Pink scaly papules    Assessment & Plan  Psoriasis trunk, extremities  Chronic and persistent condition with duration or expected duration over one year. Condition is symptomatic / bothersome to patient. Not to goal, but much improved.   Psoriasis - severe on systemic "biologic" treatment injections.  Psoriasis is a chronic non-curable, but treatable genetic/hereditary disease that may have other systemic features affecting other organ systems such as joints (Psoriatic Arthritis).  It is linked with heart disease, inflammatory bowel disease, non-alcoholic fatty liver disease, and depression. Significant skin psoriasis and/or psoriatic arthritis may have significant symptoms and affects activities of daily activity and often benefits from systemic "biologic" injection treatments.  These "biologic" treatments have some potential side effects including immunosuppression and require pre-treatment laboratory screening and periodic laboratory monitoring and periodic in person evaluation and monitoring by  the attending dermatologist physician (long term medication management).   Continue Skyrizi injections q 12 weeks. Continue Clobetasol Cream/Ointment qd/bid to body prn flares. Avoid face, groin  Labs order CBC with diff, CMP, QuantiFERON- TB gold,  Patient will have these labs drawn in January 2024 with additional labs from her PCP- patient instructed to have all labs sent here for review  CBC with Differential/Platelet - trunk, extremities  Comprehensive metabolic panel - trunk, extremities  QuantiFERON-TB Gold Plus - trunk, extremities  Risankizumab-rzaa (SKYRIZI PEN) 150 MG/ML SOAJ - trunk, extremities Inject 150 mg into the skin as directed. Every 12 weeks for maintenance.  Hypertrophic actinic keratosis (2) left wrist x 1, right ear helix x 1   (2)  Actinic keratoses are precancerous spots that appear secondary to cumulative UV radiation exposure/sun exposure over time. They are chronic with expected duration over 1 year. A portion of actinic keratoses will progress to squamous cell carcinoma of the skin. It is not possible to reliably predict which spots will progress to skin cancer and so treatment is recommended to prevent development of skin cancer.  Recommend daily broad spectrum sunscreen SPF 30+ to sun-exposed areas, reapply every 2 hours as needed.  Recommend staying in the shade or wearing long sleeves, sun glasses (UVA+UVB protection) and wide brim hats (4-inch brim around the entire circumference of the hat). Call for new or changing lesions.   Destruction of lesion - left wrist x 1, right ear helix x 1   (2)  Destruction method: cryotherapy   Informed consent: discussed and consent obtained   Timeout:  patient name, date of birth, surgical site, and procedure verified Lesion destroyed using liquid nitrogen: Yes   Region frozen until ice ball extended  beyond lesion: Yes   Outcome: patient tolerated procedure well with no complications   Post-procedure details: wound  care instructions given   Additional details:  Prior to procedure, discussed risks of blister formation, small wound, skin dyspigmentation, or rare scar following cryotherapy. Recommend Vaseline ointment to treated areas while healing.    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  - Call if any new or changing lesions are noted between office visits   Return in about 6 months (around 08/04/2022) for Psoriasis, SCC, AKs .  I, Marye Round, CMA, am acting as scribe for Brendolyn Patty, MD .   Documentation: I have reviewed the above documentation for accuracy and completeness, and I agree with the above.  Brendolyn Patty MD

## 2022-02-02 NOTE — Patient Instructions (Addendum)
Cryotherapy Aftercare  Wash gently with soap and water everyday.   Apply Vaseline and Band-Aid daily until healed.     Due to recent changes in healthcare laws, you may see results of your pathology and/or laboratory studies on MyChart before the doctors have had a chance to review them. We understand that in some cases there may be results that are confusing or concerning to you. Please understand that not all results are received at the same time and often the doctors may need to interpret multiple results in order to provide you with the best plan of care or course of treatment. Therefore, we ask that you please give us 2 business days to thoroughly review all your results before contacting the office for clarification. Should we see a critical lab result, you will be contacted sooner.   If You Need Anything After Your Visit  If you have any questions or concerns for your doctor, please call our main line at [phone] and press option 4 to reach your doctor's medical assistant. If no one answers, please leave a voicemail as directed and we will return your call as soon as possible. Messages left after 4 pm will be answered the following business day.   You may also send us a message via MyChart. We typically respond to MyChart messages within 1-2 business days.  For prescription refills, please ask your pharmacy to contact our office. Our fax number is [phone].  If you have an urgent issue when the clinic is closed that cannot wait until the next business day, you can page your doctor at the number below.    Please note that while we do our best to be available for urgent issues outside of office hours, we are not available 24/7.   If you have an urgent issue and are unable to reach us, you may choose to seek medical care at your doctor's office, retail clinic, urgent care center, or emergency room.  If you have a medical emergency, please immediately call 911 or go to the  emergency department.  Pager Numbers  - Dr. Kowalski: [phone]  - Dr. Moye: [phone]  - Dr. Stewart: [phone]  In the event of inclement weather, please call our main line at [phone] for an update on the status of any delays or closures.  Dermatology Medication Tips: Please keep the boxes that topical medications come in in order to help keep track of the instructions about where and how to use these. Pharmacies typically print the medication instructions only on the boxes and not directly on the medication tubes.   If your medication is too expensive, please contact our office at [phone] option 4 or send us a message through MyChart.   We are unable to tell what your co-pay for medications will be in advance as this is different depending on your insurance coverage. However, we may be able to find a substitute medication at lower cost or fill out paperwork to get insurance to cover a needed medication.   If a prior authorization is required to get your medication covered by your insurance company, please allow us 1-2 business days to complete this process.  Drug prices often vary depending on where the prescription is filled and some pharmacies may offer cheaper prices.  The website www.goodrx.com contains coupons for medications through different pharmacies. The prices here do not account for what the cost may be with help from insurance (it may be cheaper with your insurance), but the website can   give you the price if you did not use any insurance.  - You can print the associated coupon and take it with your prescription to the pharmacy.  - You may also stop by our office during regular business hours and pick up a GoodRx coupon card.  - If you need your prescription sent electronically to a different pharmacy, notify our office through [facility] MyChart or by phone at [phone] option 4.     Si Usted Necesita Algo Despus de Su Visita  Tambin puede  enviarnos un mensaje a travs de MyChart. Por lo general respondemos a los mensajes de MyChart en el transcurso de 1 a 2 das hbiles.  Para renovar recetas, por favor pida a su farmacia que se ponga en contacto con nuestra oficina. Nuestro nmero de fax es el [phone].  Si tiene un asunto urgente cuando la clnica est cerrada y que no puede esperar hasta el siguiente da hbil, puede llamar/localizar a su doctor(a) al nmero que aparece a continuacin.   Por favor, tenga en cuenta que aunque hacemos todo lo posible para estar disponibles para asuntos urgentes fuera del horario de oficina, no estamos disponibles las 24 horas del da, los 7 das de la semana.   Si tiene un problema urgente y no puede comunicarse con nosotros, puede optar por buscar atencin mdica  en el consultorio de su doctor(a), en una clnica privada, en un centro de atencin urgente o en una sala de emergencias.  Si tiene una emergencia mdica, por favor llame inmediatamente al 911 o vaya a la sala de emergencias.  Nmeros de bper  - Dr. Kowalski: [phone]  - Dra. Moye: [phone]  - Dra. Stewart: [phone]  En caso de inclemencias del tiempo, por favor llame a nuestra lnea principal al [phone] para una actualizacin sobre el estado de cualquier retraso o cierre.  Consejos para la medicacin en dermatologa: Por favor, guarde las cajas en las que vienen los medicamentos de uso tpico para ayudarle a seguir las instrucciones sobre dnde y cmo usarlos. Las farmacias generalmente imprimen las instrucciones del medicamento slo en las cajas y no directamente en los tubos del medicamento.   Si su medicamento es muy caro, por favor, pngase en contacto con nuestra oficina llamando al [phone] y presione la opcin 4 o envenos un mensaje a travs de MyChart.   No podemos decirle cul ser su copago por los medicamentos por adelantado ya que esto es diferente dependiendo de la cobertura de su seguro.  Sin embargo, es posible que podamos encontrar un medicamento sustituto a menor costo o llenar un formulario para que el seguro cubra el medicamento que se considera necesario.   Si se requiere una autorizacin previa para que su compaa de seguros cubra su medicamento, por favor permtanos de 1 a 2 das hbiles para completar este proceso.  Los precios de los medicamentos varan con frecuencia dependiendo del lugar de dnde se surte la receta y alguna farmacias pueden ofrecer precios ms baratos.  El sitio web www.goodrx.com tiene cupones para medicamentos de diferentes farmacias. Los precios aqu no tienen en cuenta lo que podra costar con la ayuda del seguro (puede ser ms barato con su seguro), pero el sitio web puede darle el precio si no utiliz ningn seguro.  - Puede imprimir el cupn correspondiente y llevarlo con su receta a la farmacia.  - Tambin puede pasar por nuestra oficina durante el horario de atencin regular y recoger una tarjeta de cupones de GoodRx.  -   Si necesita que su receta se enve electrnicamente a una farmacia diferente, informe a nuestra oficina a travs de MyChart de [place] o por telfono llamando al [phone] y presione la opcin 4.  

## 2022-02-05 NOTE — Progress Notes (Signed)
Patient for US guided Core LT Parotid Mass Biopsy on Fri 02/06/22, I called and spoke with the patient on phone and gave pre-procedure instructions. Pt was made aware to be here at 12:30p, Pt stated understanding. Talked with the pt 02/05/22

## 2022-02-06 ENCOUNTER — Ambulatory Visit
Admission: RE | Admit: 2022-02-06 | Discharge: 2022-02-06 | Disposition: A | Discharge status: Home / Self Care | Payer: PPO | Source: Ambulatory Visit | Attending: Otolaryngology | Admitting: Otolaryngology

## 2022-02-06 DIAGNOSIS — D110 Benign neoplasm of parotid gland: Secondary | ICD-10-CM | POA: Diagnosis not present

## 2022-02-06 DIAGNOSIS — K118 Other diseases of salivary glands: Secondary | ICD-10-CM | POA: Diagnosis not present

## 2022-02-06 MED ORDER — LIDOCAINE HCL (PF) 1 % IJ SOLN
10.0000 mL | Freq: Once | INTRAMUSCULAR | Status: AC
  Administered 2022-02-06: 10 mL via INTRADERMAL

## 2022-02-06 NOTE — Procedures (Signed)
Interventional Radiology Procedure Note  Procedure: [place] core bx left parotid mass    Complications: None  Estimated Blood Loss:  min  Findings: 18 g cores    Tamera Punt, MD

## 2022-02-06 NOTE — Progress Notes (Signed)
Patient post parotid biopsy per Dr Annamaria Boots, patient with history of htn  on home meds which she states she takes at night, son in waiting room but patient had driven herself to appointment today. After procedure noting per [place] staff increased BP, brought to specials to monitor for hour and reassess. Patient denies complaints at this  time of arrival.

## 2022-02-06 NOTE — Progress Notes (Signed)
Patient resting without complaints post procedure. Denies complaints at this time. Vitals as noted. Consulted with Dr Annamaria Boots, patient to be discharged at this time.

## 2022-02-10 LAB — SURGICAL PATHOLOGY

## 2022-02-15 NOTE — Progress Notes (Unsigned)
Cardiology Office Note  Date:  02/16/2022   ID:  Ronnisha, Felber January 17, 1946, MRN [id number]  PCP:  Dion Body, MD   Chief Complaint  Patient presents with   Establish Care- Former patient of Dr. Ubaldo Glassing    Patient c/o pain & cramping in legs & mild shortness of breath. Medications reviewed by the patient verbally.     HPI:  Ms. 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 Ubaldo Glassing for hypertension, PAD  Last seen by cardiology at [facility] September 2023 In follow-up today denies chest pain concerning for angina She reports having cardiac catheterization 2011, nonobstructive disease by her report Report not in the system  Prior cardiac imaging studies reviewed Prior stress test with no ischemia 2018  Echocardiogram November 2019 normal ejection fraction  BP elevated at home 160-170 at home Elevated today  Lab work reviewed Total cholesterol 125 LDL 70 Creatinine 1.77 BUN 30 A1c 5.7  Still smoking, 1/4 ppd Lost weight over the years  Previously on nebulizer PRN, stopped by pulmonary Needs refill on venolin  EKG personally reviewed by myself on todays visit NSR rate 64 bpm no St or T wave changes  PMH:   has a past medical history of Actinic keratosis (10/29/2014), Actinic keratosis (08/17/2017), Arthritis, Basal cell carcinoma of skin, Chronic kidney disease, Coronary artery disease, Hyperlipidemia, Hypertension, Myocardial infarction ([place]) (07/2009), Peripheral vascular disease ([place]), Psoriasis, Sleep apnea, Squamous cell carcinoma of skin (07/13/2017), Squamous cell carcinoma of skin (11/23/2017), Squamous cell carcinoma of skin (01/07/2022), Squamous cell skin cancer (06/18/2014), TIA (transient ischemic attack) (2014), Vertigo, and Wears  dentures.  PSH:    Past Surgical History:  Procedure Laterality Date   CARDIAC CATHETERIZATION  2011   ENDARTERECTOMY Left 11/28/2015   Procedure: ENDARTERECTOMY CAROTID;  Surgeon: Algernon Huxley, MD;  Location: [facility];  Service: Vascular;  Laterality: Left;   EYE SURGERY Bilateral    Cataract Extraction with IOL   MYRINGOTOMY WITH TUBE PLACEMENT Bilateral 08/19/2016   Procedure: MYRINGOTOMY WITH TUBE PLACEMENT;  Surgeon: Carloyn Manner, MD;  Location: [facility];  Service: ENT;  Laterality: Bilateral;  sleep apnea   MYRINGOTOMY WITH TUBE PLACEMENT Bilateral 08/04/2017   Procedure: MYRINGOTOMY WITH TUBE PLACEMENT           BUTTERFLY TUBES;  Surgeon: Carloyn Manner, MD;  Location: [facility];  Service: ENT;  Laterality: Bilateral;   UVULOPALATOPHARYNGOPLASTY  2000    Current Outpatient Medications  Medication Sig Dispense Refill   aspirin 81 MG tablet Take 81 mg by mouth daily.     atorvastatin (LIPITOR) 40 MG tablet Take 40 mg by mouth daily.  0   Cholecalciferol 25 MCG (1000 UT) tablet Take by mouth.     clobetasol ointment (TEMOVATE) 0.05 % Apply topically as directed. Qd ti bid aa psoriasis on body until clear, then prn flares, avoid face, groin, axilla 60 g 1   clopidogrel (PLAVIX) 75 MG tablet Take 75 mg by mouth daily.  0   clotrimazole-betamethasone (LOTRISONE) cream Apply 1 application topically 2 (two) times daily as needed.      isosorbide mononitrate (IMDUR) 30 MG 24 hr tablet Take 30 mg by mouth daily.  0   lisinopril (ZESTRIL) 5 MG tablet Take 5 mg by mouth daily.     Risankizumab-rzaa (SKYRIZI PEN) 150 MG/ML  SOAJ Inject 150 mg into the skin as directed. At weeks 0 & 4. 1 mL 1   albuterol (VENTOLIN HFA) 108 (90 Base) MCG/ACT inhaler Inhale 1-2 puffs into the lungs every 6 (six) hours as needed for wheezing or shortness of breath. 1 each 3   amLODipine (NORVASC) 5 MG tablet Take 1 tablet (5 mg total) by mouth daily. 90 tablet 3   budesonide (PULMICORT) 0.5  MG/2ML nebulizer solution USE 1 VIAL  IN  NEBULIZER TWICE  DAILY - rinse mouth after treatment (Patient not taking: Reported on 02/16/2022)     Risankizumab-rzaa (SKYRIZI PEN) 150 MG/ML SOAJ Inject 150 mg into the skin as directed. Every 12 weeks for maintenance. (Patient not taking: Reported on 02/16/2022) 1 mL 0   No current facility-administered medications for this visit.    Allergies:   Codeine   Social History:  The patient  reports that she has been smoking cigarettes. She has a 59.00 pack-year smoking history. She has never used smokeless tobacco. She reports that she does not drink alcohol and does not use drugs.   Family History:   family history is not on file.   Review of Systems: Review of Systems  Constitutional: Negative.   HENT: Negative.    Respiratory: Negative.    Cardiovascular: Negative.   Gastrointestinal: Negative.   Musculoskeletal: Negative.   Neurological: Negative.   Psychiatric/Behavioral: Negative.    All other systems reviewed and are negative.  PHYSICAL EXAM: VS:  BP 130/76 (BP Location: Right Arm, Patient Position: Sitting, Cuff Size: Normal)   Pulse 64   Ht '5\' 2"'$  (1.575 m)   Wt 118 lb (53.5 kg)   SpO2 97%   BMI 21.58 kg/m  , BMI Body mass index is 21.58 kg/m. GEN: Well nourished, well developed, in no acute distress HEENT: normal Neck: no JVD, carotid bruits, or masses Cardiac: RRR; no murmurs, rubs, or gallops,no edema  Respiratory:  clear to auscultation bilaterally, normal work of breathing GI: soft, nontender, nondistended, + BS MS: no deformity or atrophy Skin: warm and dry, no rash Neuro:  Strength and sensation are intact Psych: euthymic mood, full affect  Recent Labs: No results found for requested labs within last 365 days.    Lipid Panel No results found for: "CHOL", "HDL", "LDLCALC", "TRIG"    Wt Readings from Last 3 Encounters:  02/16/22 118 lb (53.5 kg)  08/04/21 124 lb (56.2 kg)  06/26/21 126 lb 3.2 oz (57.2 kg)      ASSESSMENT AND PLAN:  Problem List Items Addressed This Visit       Cardiology Problems   AAA (abdominal aortic aneurysm) without rupture (HCC) - Primary   Relevant Medications   lisinopril (ZESTRIL) 5 MG tablet   amLODipine (NORVASC) 5 MG tablet   Other Relevant Orders   EKG 12-Lead   Carotid stenosis   Relevant Medications   lisinopril (ZESTRIL) 5 MG tablet   amLODipine (NORVASC) 5 MG tablet   Other Relevant Orders   EKG 12-Lead   Hyperlipidemia   Relevant Medications   lisinopril (ZESTRIL) 5 MG tablet   amLODipine (NORVASC) 5 MG tablet   Essential hypertension, benign   Relevant Medications   lisinopril (ZESTRIL) 5 MG tablet   amLODipine (NORVASC) 5 MG tablet   Other Relevant Orders   EKG 12-Lead   VAS US RENAL ARTERY DUPLEX   Other Visit Diagnoses     Smoker       Coronary artery disease of native artery of native heart  with stable angina pectoris (HCC)       Relevant Medications   lisinopril (ZESTRIL) 5 MG tablet   amLODipine (NORVASC) 5 MG tablet   Other Relevant Orders   EKG 12-Lead      PAD: Followed by vascular, Dr. Delana Meyer AAA 3.59 x 3.91 cm Mild bilateral carotid disease less than 39% bilaterally  Essential hypertension Recommended that she increase amlodipine up to 5 mg in the p.m., stay on current dose isosorbide and lisinopril We have ordered a renal artery ultrasound  Hyperlipidemia Cholesterol is at goal on the current lipid regimen. No changes to the medications were made.  Coronary artery disease with stable angina Catheterization 2011, we will try to obtain the report for our system No intervention at that time  Chronic renal insufficiency Followed by nephrology Creatinine stable 1.7 up to 1.8 Stressed importance of aggressive blood pressure control, renal artery ultrasound ordered  COPD Followed by pulmonary It was recommended she was told by pulmonary to only use nebulizers as needed She is requesting refill on her  Ventolin   Total encounter time more than 60 minutes  Greater than 50% was spent in counseling and coordination of care with the patient    Signed, Esmond Plants, M.D., Ph.D. [place], [place]

## 2022-02-16 ENCOUNTER — Ambulatory Visit: Payer: PPO | Attending: Cardiovascular Disease | Admitting: Cardiovascular Disease

## 2022-02-16 VITALS — BP 130/76 | HR 64 | Ht 62.0 in | Wt 118.0 lb

## 2022-02-16 DIAGNOSIS — I25118 Atherosclerotic heart disease of native coronary artery with other forms of angina pectoris: Secondary | ICD-10-CM

## 2022-02-16 DIAGNOSIS — I6523 Occlusion and stenosis of bilateral carotid arteries: Secondary | ICD-10-CM | POA: Diagnosis not present

## 2022-02-16 DIAGNOSIS — I7143 Infrarenal abdominal aortic aneurysm, without rupture: Secondary | ICD-10-CM

## 2022-02-16 DIAGNOSIS — F17200 Nicotine dependence, unspecified, uncomplicated: Secondary | ICD-10-CM | POA: Diagnosis not present

## 2022-02-16 DIAGNOSIS — I10 Essential (primary) hypertension: Secondary | ICD-10-CM | POA: Diagnosis not present

## 2022-02-16 DIAGNOSIS — E782 Mixed hyperlipidemia: Secondary | ICD-10-CM | POA: Diagnosis not present

## 2022-02-16 MED ORDER — ALBUTEROL SULFATE HFA 108 (90 BASE) MCG/ACT IN AERS: 1.0000 | INHALATION_SPRAY | Freq: Four times a day (QID) | RESPIRATORY_TRACT | 3 refills | Status: DC | PRN

## 2022-02-16 MED ORDER — ALBUTEROL SULFATE HFA 108 (90 BASE) MCG/ACT IN AERS
1.0000 | INHALATION_SPRAY | Freq: Four times a day (QID) | RESPIRATORY_TRACT | 3 refills | Status: DC | PRN
  Filled 2022-09-18 (×2): qty 6.7, 25d supply, fill #0

## 2022-02-16 MED ORDER — AMLODIPINE BESYLATE 5 MG PO TABS
5.0000 mg | ORAL_TABLET | Freq: Every day | ORAL | 3 refills | Status: DC
  Filled 2022-09-18 (×2): qty 90, 90d supply, fill #0
  Filled 2022-12-15: qty 90, 90d supply, fill #1

## 2022-02-16 NOTE — Patient Instructions (Addendum)
Monitor blood pressure Call if it runs high (Consistently over 140/80)   Medication Instructions:  - Your physician has recommended you make the following change in your medication:   1) INCREASE Amlodipine to 5 mg: - take 1 tablet by mouth once daily in the evening  If you need a refill on your cardiac medications before your next appointment, please call your pharmacy.    Lab work: No new labs needed   Testing/Procedures: 1) Renal Artery Duplex: (HTN, renal dysfunction, PAD) - Your physician has requested that you have a renal artery duplex. During this test, an ultrasound is used to evaluate blood flow to the kidneys. Allow one hour for this exam. Do not eat after midnight the day before and avoid carbonated beverages. Take your medications as you usually do.   Follow-Up: At [facility], you and your health needs are our priority.  As part of our continuing mission to provide you with exceptional heart care, we have created designated Provider Care Teams.  These Care Teams include your primary Cardiologist (physician) and Advanced Practice Providers (APPs -  Physician Assistants and Nurse Practitioners) who all work together to provide you with the care you need, when you need it.  You will need a follow up appointment in 6 months  Providers on your designated Care Team:   Murray Hodgkins, NP Christell Faith, PA-C Cadence Kathlen Mody, [place]  COVID-19 Vaccine Information can be found at: [email or web address] For questions related to vaccine distribution or appointments, please email [email or web address] or call [phone].    How to Take Your Blood Pressure Blood pressure measures how strongly your blood is pressing against the walls of your arteries. Arteries are blood vessels that carry blood from your heart throughout your body. You can take your blood pressure at home with a machine. You may need to check your blood  pressure at home: To check if you have high blood pressure (hypertension). To check your blood pressure over time. To make sure your blood pressure medicine is working. Supplies needed: Blood pressure machine, or monitor. A chair to sit in. This should be a chair where you can sit upright with your back supported. Do not sit on a soft couch or an armchair. Table or desk. Small notebook. Pencil or pen. How to prepare Avoid these things for 30 minutes before checking your blood pressure: Having drinks with caffeine in them, such as coffee or tea. Drinking alcohol. Eating. Smoking. Exercising. Do these things five minutes before checking your blood pressure: Go to the bathroom and pee (urinate). Sit in a chair. Be quiet. Do not talk. How to take your blood pressure Follow the instructions that came with your machine. If you have a digital blood pressure monitor, these may be the instructions: Sit up straight. Place your feet on the floor. Do not cross your ankles or legs. Rest your left arm at the level of your heart. You may rest it on a table, desk, or chair. Pull up your shirt sleeve. Wrap the blood pressure cuff around the upper part of your left arm. The cuff should be 1 inch (2.5 cm) above your elbow. It is best to wrap the cuff around bare skin. Fit the cuff snugly around your arm, but not too tightly. You should be able to place only one finger between the cuff and your arm. Place the cord so that it rests in the bend of your elbow. Press the power button. Sit quietly while the cuff fills with air and loses  air. Write down the numbers on the screen. Wait 2-3 minutes and then repeat steps 1-10. What do the numbers mean? Two numbers make up your blood pressure. The first number is called systolic pressure. The second is called diastolic pressure. An example of a blood pressure reading is "120 over 80" (or 120/80). If you are an adult and do not have a medical condition, use  this guide to find out if your blood pressure is normal: Normal First number: below 120. Second number: below 80. Elevated First number: 120-129. Second number: below 80. Hypertension stage 1 First number: 130-139. Second number: 80-89. Hypertension stage 2 First number: 140 or above. Second number: 69 or above. Your blood pressure is above normal even if only the first or only the second number is above normal. Follow these instructions at home: Medicines Take over-the-counter and prescription medicines only as told by your doctor. Tell your doctor if your medicine is causing side effects. General instructions Check your blood pressure as often as your doctor tells you to. Check your blood pressure at the same time every day. Take your monitor to your next doctor's appointment. Your doctor will: Make sure you are using it correctly. Make sure it is working right. Understand what your blood pressure numbers should be. Keep all follow-up visits. General tips You will need a blood pressure machine or monitor. Your doctor can suggest a monitor. You can buy one at a drugstore or online. When choosing one: Choose one with an arm cuff. Choose one that wraps around your upper arm. Only one finger should fit between your arm and the cuff. Do not choose one that measures your blood pressure from your wrist or finger. Where to find more information American Heart Association: www.heart.org Contact a doctor if: Your blood pressure keeps being high. Your blood pressure is suddenly low. Get help right away if: Your first blood pressure number is higher than 180. Your second blood pressure number is higher than 120. These symptoms may be an emergency. Do not wait to see if the symptoms will go away. Get help right away. Call 911. Summary Check your blood pressure at the same time every day. Avoid caffeine, alcohol, smoking, and exercise for 30 minutes before checking your blood  pressure. Make sure you understand what your blood pressure numbers should be. This information is not intended to replace advice given to you by your health care provider. Make sure you discuss any questions you have with your health care provider. Document Revised: 12/05/2020 Document Reviewed: 12/05/2020 Elsevier Patient Education  [place].     Renal Artery Stenosis Renal artery stenosis (RAS) is a narrowing of the artery that carries blood to the kidneys. It can affect one or both kidneys. The kidneys filter waste and extra fluid from the blood. Waste and fluid are then removed when a person passes urine. The kidneys also make an important chemical messenger (hormone) called renin. Renin helps regulate blood pressure. The first sign of RAS may be high blood pressure. Other symptoms can develop over time. What are the causes? A common cause of this condition is plaque buildup in your arteries (atherosclerosis). The plaques that cause this are made up of: Fat. Cholesterol. Calcium. Other substances. As these substances build up in your renal artery, the blood supply to your kidneys slows. The lack of blood and oxygen causes the signs and symptoms of RAS. A much less common cause of RAS is a disease called fibromuscular dysplasia. This disease  causes abnormal cell growth that narrows the renal artery. It is not related to atherosclerosis. It occurs mostly in women who are 96-70 years old. It may be passed down through families.  What increases the risk? You are more likely to develop this condition if you: Are a man who is at least 76 years old. Are a woman who is at least 76 years old. Have high blood pressure. Have high cholesterol. Are a smoker. Abuse alcohol. Have diabetes or prediabetes. Are overweight or obese. Have a family history of early heart disease. What are the signs or symptoms? RAS usually develops slowly. You may not have any signs or symptoms at first.  Early signs may include: Development of high blood pressure. A sudden increase in existing high blood pressure. No longer responding to medicine that used to control your blood pressure. Later signs and symptoms are due to kidney damage. They may include: Feeling tired (fatigue). Shortness of breath. Swollen legs and feet. Dry skin. Headaches. Muscle cramps. Loss of appetite. Nausea or vomiting. How is this diagnosed? This condition may be diagnosed based on: Your symptoms and medical history. Your health care provider may suspect RAS based on changes in your blood pressure and your risk factors. A physical exam. During the exam, your health care provider will use a stethoscope to listen for a whooshing sound (bruit) that can occur where the renal artery is blocking blood flow. Various tests. These may include: Blood and urine tests to check your kidney function. Imaging tests of your kidneys, such as: A test that uses sound waves to create an image of your kidneys and the blood flow to your kidneys (ultrasound). A test in which dye is injected into one of your blood vessels so images can be taken as the dye flows through your renal arteries (angiogram). This can be done using X-rays, a CT scan (computed tomography angiogram, CTA), or a type of MRI (magnetic resonance angiogram, MRA). How is this treated? Making lifestyle changes to reduce your risk factors is the first treatment option for early RAS. If the blood flow to one of your kidneys is cut by more than half, you may need medicine to: Lower your blood pressure. This is the main medical treatment for RAS. You may need more than one type of medicine for this. The types that work best for people with RAS are: ACE inhibitors. Angiotensin receptor blockers. Reduce fluid in the body (diuretics). Lower your cholesterol (statins). If medicine is not enough to control RAS, you may need surgery. This may involve: Threading a tube with an  inflatable balloon into the renal artery to force it to open (angioplasty). Removing plaque from inside the artery (endarterectomy). Follow these instructions at home:  Lifestyle Make any lifestyle changes recommended by your health care provider. This may include: Working with a dietitian to maintain a heart-healthy diet. This type of diet is low in saturated fat, salt, and added sugar. Starting an exercise program as directed by your health care provider. Maintaining a healthy weight. Quitting smoking. Not abusing alcohol. General instructions Take over-the-counter and prescription medicines only as told by your health care provider. Keep all follow-up visits as told by your health care provider. This is important. Contact a health care provider if: Your symptoms of RAS are not getting better. Your symptoms are changing or getting worse. Get help right away if you have: Very bad pain in your back or abdomen. Blood in your urine. Summary Renal artery stenosis (RAS) is  a narrowing of the artery that carries blood to the kidneys. It can affect one or both kidneys. RAS usually develops slowly. You may not have any signs or symptoms at first, but high blood pressure that is difficult to control is a key symptom. Making lifestyle changes to reduce your risk factors is the first treatment option for early RAS. If the blood flow to one of your kidneys is cut by more than half, you may need medicines to help manage your cholesterol and blood pressure. This information is not intended to replace advice given to you by your health care provider. Make sure you discuss any questions you have with your health care provider. Document Revised: 09/26/2021 Document Reviewed: 11/12/2020 Elsevier Patient Education  [place].

## 2022-04-16 ENCOUNTER — Ambulatory Visit: Payer: PPO | Attending: Cardiovascular Disease

## 2022-04-16 DIAGNOSIS — R7303 Prediabetes: Secondary | ICD-10-CM | POA: Diagnosis not present

## 2022-04-16 DIAGNOSIS — I10 Essential (primary) hypertension: Secondary | ICD-10-CM | POA: Diagnosis not present

## 2022-04-16 DIAGNOSIS — E782 Mixed hyperlipidemia: Secondary | ICD-10-CM | POA: Diagnosis not present

## 2022-04-16 DIAGNOSIS — N184 Chronic kidney disease, stage 4 (severe): Secondary | ICD-10-CM | POA: Diagnosis not present

## 2022-04-21 ENCOUNTER — Telehealth: Payer: Self-pay

## 2022-04-21 NOTE — Telephone Encounter (Signed)
Pt called stating she check her blood every night and for 3 night in a row, her blood pressure will range from 108/85-111/60. She report occasionally she will feel lightheaded, but when running that low she will hold her amlodipine. However, then next day blood pressure will increase to 175/80.  Pt questioning what MD recommends.

## 2022-04-22 NOTE — Telephone Encounter (Signed)
Pt made aware of MD's recommendations and verbalized understanding.   Minna Merritts, MD  [facility] [phone] hours ago (5:37 PM)    If blood pressure is okay would take the full amlodipine 5 at night  If blood pressure number runs low would take amlodipine half a pill 2.5 mg  If blood pressure higher the next morning would take the other half pill For total of 5 mg Thx TGollan

## 2022-04-23 DIAGNOSIS — F17210 Nicotine dependence, cigarettes, uncomplicated: Secondary | ICD-10-CM | POA: Diagnosis not present

## 2022-04-23 DIAGNOSIS — Z0000 Encounter for general adult medical examination without abnormal findings: Secondary | ICD-10-CM | POA: Diagnosis not present

## 2022-04-23 DIAGNOSIS — N184 Chronic kidney disease, stage 4 (severe): Secondary | ICD-10-CM | POA: Diagnosis not present

## 2022-04-23 DIAGNOSIS — R7303 Prediabetes: Secondary | ICD-10-CM | POA: Diagnosis not present

## 2022-04-23 DIAGNOSIS — E782 Mixed hyperlipidemia: Secondary | ICD-10-CM | POA: Diagnosis not present

## 2022-04-23 DIAGNOSIS — D638 Anemia in other chronic diseases classified elsewhere: Secondary | ICD-10-CM | POA: Diagnosis not present

## 2022-04-23 DIAGNOSIS — I10 Essential (primary) hypertension: Secondary | ICD-10-CM | POA: Diagnosis not present

## 2022-04-23 DIAGNOSIS — I2510 Atherosclerotic heart disease of native coronary artery without angina pectoris: Secondary | ICD-10-CM | POA: Diagnosis not present

## 2022-05-07 ENCOUNTER — Other Ambulatory Visit: Payer: Self-pay

## 2022-05-07 DIAGNOSIS — L409 Psoriasis, unspecified: Secondary | ICD-10-CM

## 2022-05-07 MED ORDER — SKYRIZI PEN 150 MG/ML ~~LOC~~ SOAJ: 150.0000 mg | SUBCUTANEOUS | 0 refills | Status: DC

## 2022-05-27 DIAGNOSIS — I10 Essential (primary) hypertension: Secondary | ICD-10-CM | POA: Diagnosis not present

## 2022-05-27 DIAGNOSIS — I129 Hypertensive chronic kidney disease with stage 1 through stage 4 chronic kidney disease, or unspecified chronic kidney disease: Secondary | ICD-10-CM | POA: Diagnosis not present

## 2022-05-27 DIAGNOSIS — N184 Chronic kidney disease, stage 4 (severe): Secondary | ICD-10-CM | POA: Diagnosis not present

## 2022-05-27 DIAGNOSIS — R809 Proteinuria, unspecified: Secondary | ICD-10-CM | POA: Diagnosis not present

## 2022-06-01 ENCOUNTER — Other Ambulatory Visit: Payer: Self-pay | Admitting: Otolaryngology

## 2022-06-01 DIAGNOSIS — R221 Localized swelling, mass and lump, neck: Secondary | ICD-10-CM

## 2022-06-01 DIAGNOSIS — H7202 Central perforation of tympanic membrane, left ear: Secondary | ICD-10-CM | POA: Diagnosis not present

## 2022-06-01 DIAGNOSIS — H908 Mixed conductive and sensorineural hearing loss, unspecified: Secondary | ICD-10-CM | POA: Diagnosis not present

## 2022-06-01 DIAGNOSIS — D110 Benign neoplasm of parotid gland: Secondary | ICD-10-CM | POA: Diagnosis not present

## 2022-06-01 DIAGNOSIS — H6981 Other specified disorders of Eustachian tube, right ear: Secondary | ICD-10-CM | POA: Diagnosis not present

## 2022-06-01 DIAGNOSIS — H906 Mixed conductive and sensorineural hearing loss, bilateral: Secondary | ICD-10-CM | POA: Diagnosis not present

## 2022-06-01 DIAGNOSIS — H6123 Impacted cerumen, bilateral: Secondary | ICD-10-CM | POA: Diagnosis not present

## 2022-06-03 DIAGNOSIS — N2581 Secondary hyperparathyroidism of renal origin: Secondary | ICD-10-CM | POA: Diagnosis not present

## 2022-06-03 DIAGNOSIS — I10 Essential (primary) hypertension: Secondary | ICD-10-CM | POA: Diagnosis not present

## 2022-06-03 DIAGNOSIS — R809 Proteinuria, unspecified: Secondary | ICD-10-CM | POA: Diagnosis not present

## 2022-06-03 DIAGNOSIS — N184 Chronic kidney disease, stage 4 (severe): Secondary | ICD-10-CM | POA: Diagnosis not present

## 2022-06-03 DIAGNOSIS — I129 Hypertensive chronic kidney disease with stage 1 through stage 4 chronic kidney disease, or unspecified chronic kidney disease: Secondary | ICD-10-CM | POA: Diagnosis not present

## 2022-06-11 ENCOUNTER — Ambulatory Visit
Admission: RE | Admit: 2022-06-11 | Discharge: 2022-06-11 | Disposition: A | Discharge status: Home / Self Care | Payer: BC Managed Care – PPO | Source: Ambulatory Visit | Attending: Otolaryngology | Admitting: Otolaryngology

## 2022-06-11 DIAGNOSIS — R221 Localized swelling, mass and lump, neck: Secondary | ICD-10-CM

## 2022-06-15 ENCOUNTER — Encounter: Payer: Self-pay | Admitting: Otolaryngology

## 2022-06-15 ENCOUNTER — Other Ambulatory Visit: Payer: Self-pay

## 2022-06-15 DIAGNOSIS — L409 Psoriasis, unspecified: Secondary | ICD-10-CM

## 2022-06-15 NOTE — Progress Notes (Signed)
ERROR

## 2022-06-16 IMAGING — US US RENAL
1 series · 14 of 25 positions shown · non-contrast
Comparison: None available.

CLINICAL DATA: Initial evaluation for stage III B chronic kidney
disease.

EXAM:
RENAL / URINARY TRACT ULTRASOUND COMPLETE

[Series 1: us renal · 0.23mm/px · 14 of 32 slices shown]
[im 1/32]
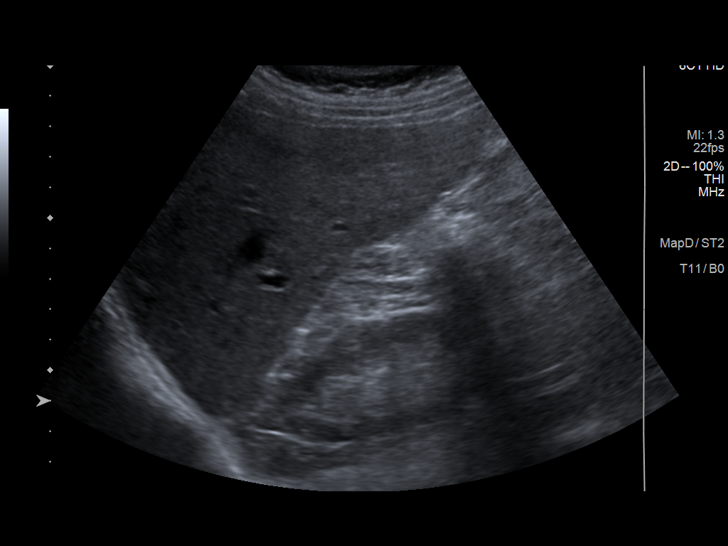
[im 3/32]
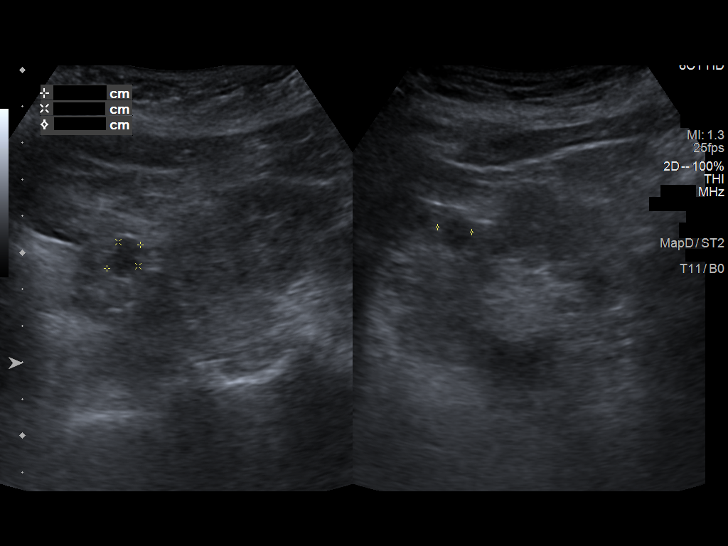
[im 6/32]
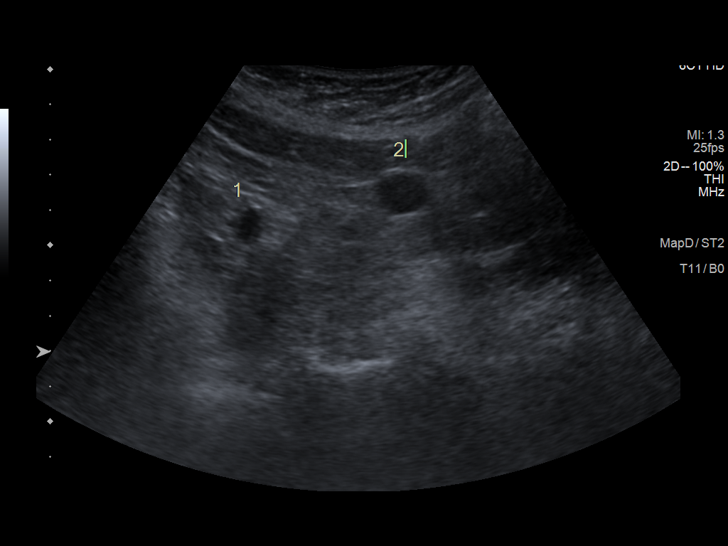
[im 8/32]
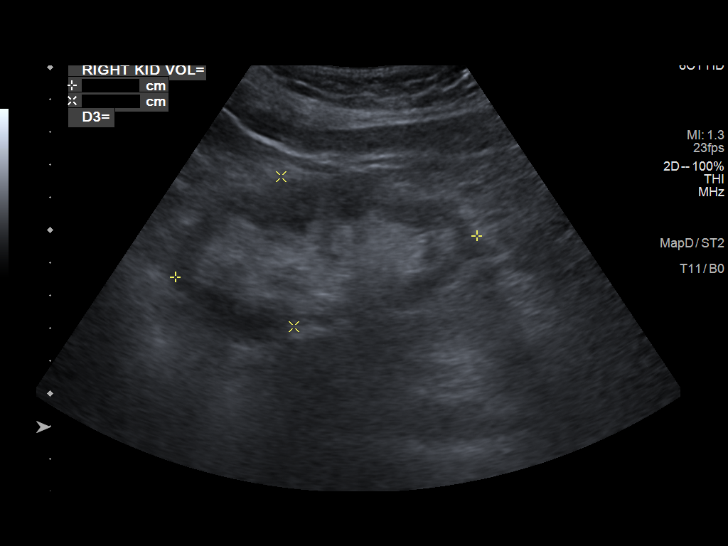
[im 11/32]
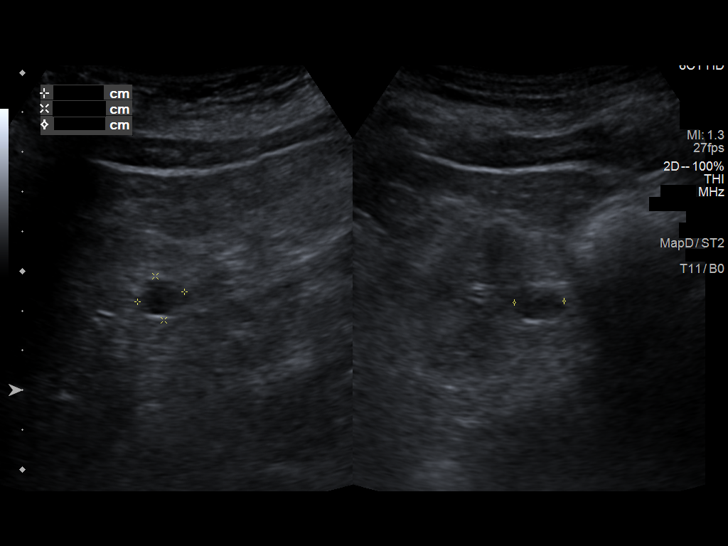
[im 12/32]
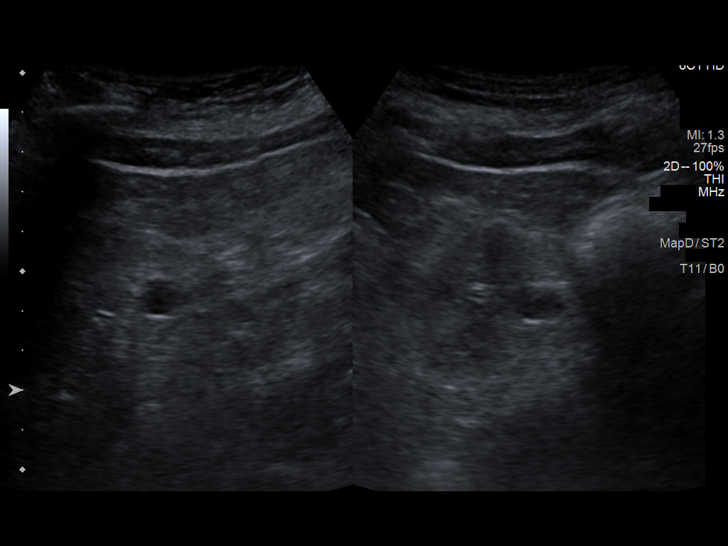
[im 15/32]
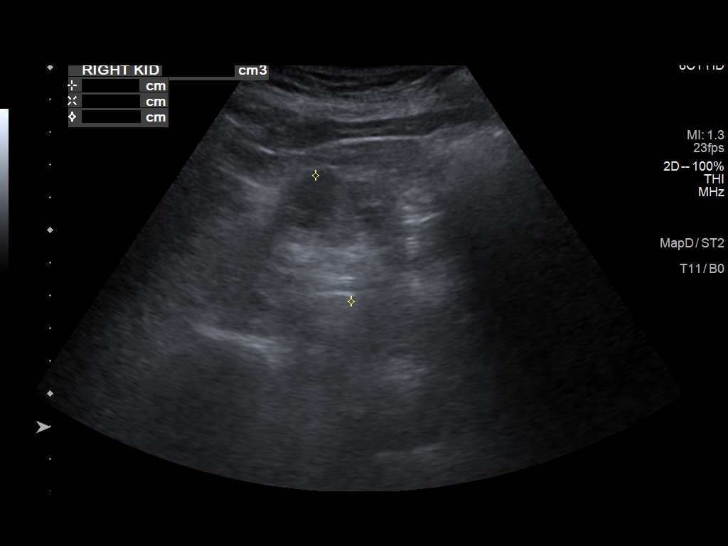
[im 17/32]
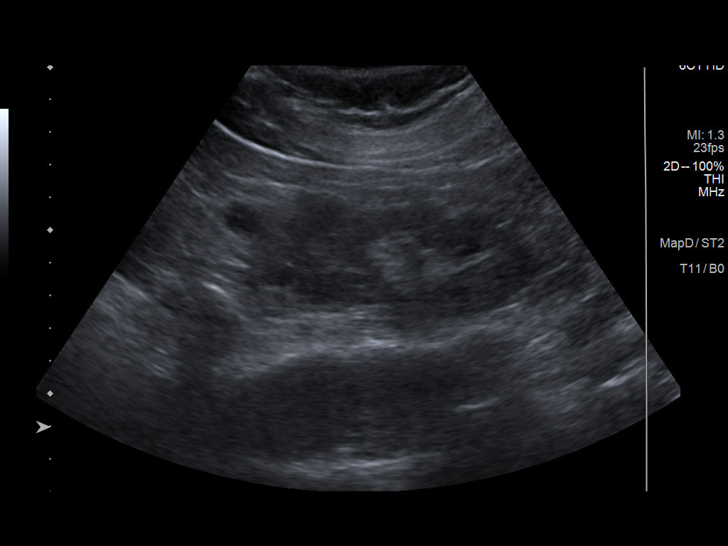
[im 20/32]
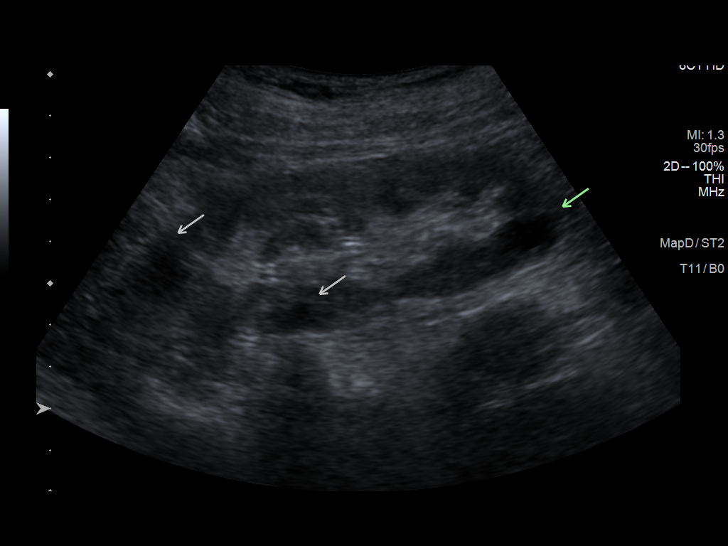
[im 21/32]
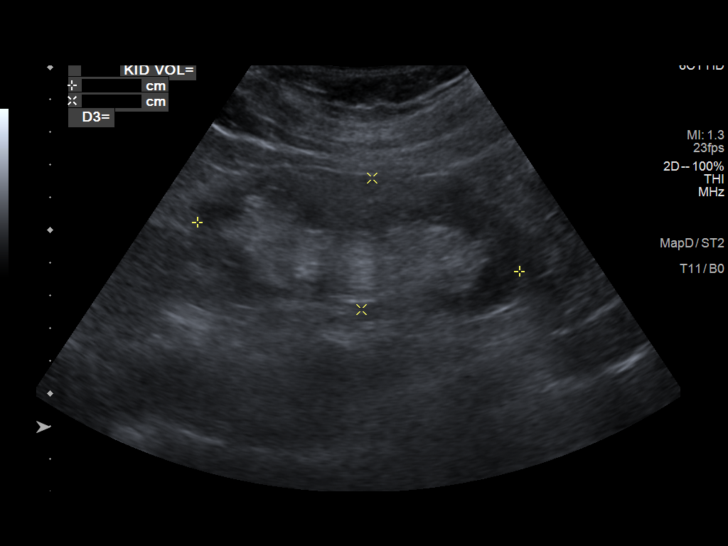
[im 24/32]
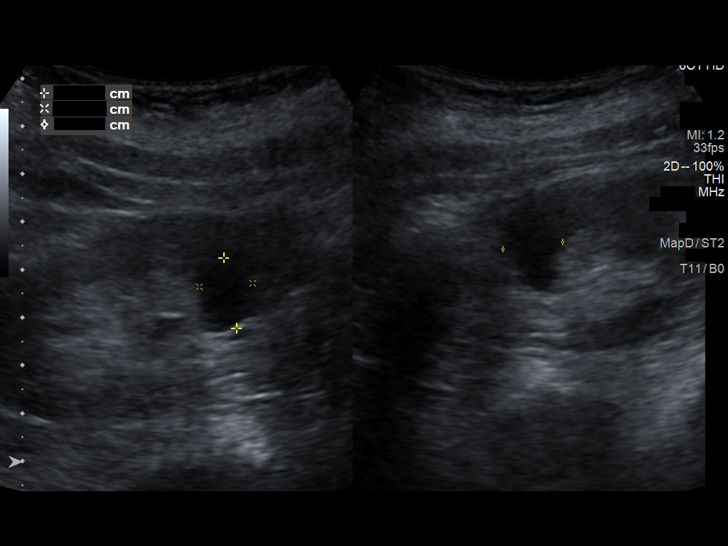
[im 26/32]
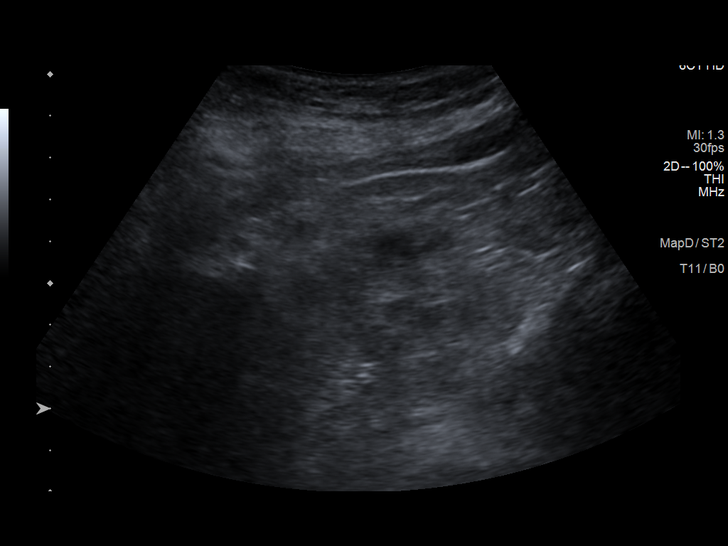
[im 29/32]
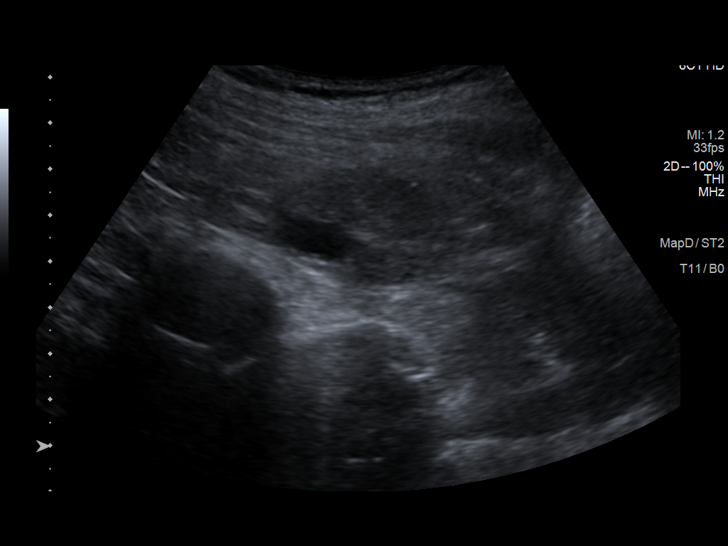
[im 32/32]
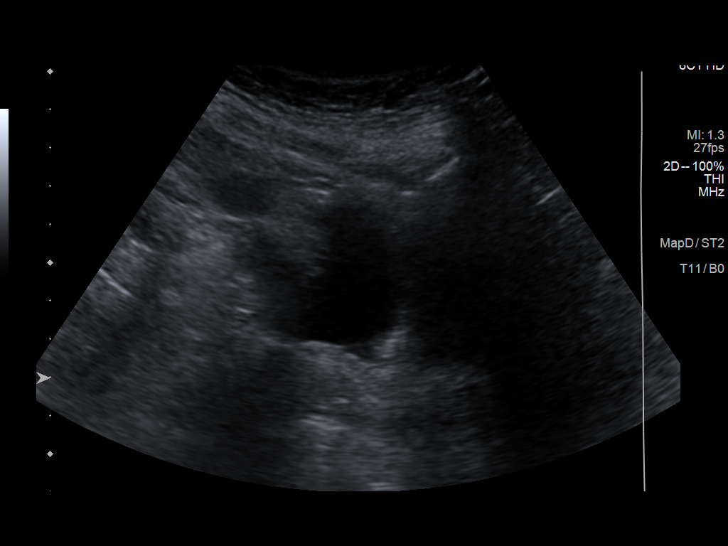

[14 of 25 positions shown; findings below may reference images not displayed]

FINDINGS: Right Kidney:

Renal measurements: 9.3 x 4.6 x 4.0 cm = volume: 90 mL. Mild diffuse
cortical thinning with increased echogenicity within the renal
parenchyma. No nephrolithiasis or hydronephrosis. Few scattered
benign simple cysts noted involving the right kidney, largest of
which measures 1.4 cm at the interpolar region.

Left Kidney:

Renal measurements: 10.0 x 4.0 x 3.8 cm = volume: 80 mL. Diffuse
cortical thinning with increased echogenicity within the renal
parenchyma. No nephrolithiasis or hydronephrosis. Multiple scattered
benign-appearing cysts, largest of which measures 1.5 cm at the
interpolar region.

Bladder:

Appears normal for degree of bladder distention.

Other:

None.
IMPRESSION: 1. Diffuse cortical thinning with increased echogenicity within the
renal parenchyma, compatible with chronic medical renal disease.
2. No hydronephrosis.
3. Multiple simple renal cysts involving the kidneys bilaterally as
above.

## 2022-06-17 ENCOUNTER — Other Ambulatory Visit: Payer: Self-pay

## 2022-06-17 MED ORDER — CIPROFLOXACIN-DEXAMETHASONE 0.3-0.1 % OT SUSP
4.0000 [drp] | Freq: Two times a day (BID) | OTIC | 0 refills | Status: DC
  Filled 2022-06-17: qty 7.5, 7d supply, fill #0

## 2022-06-18 ENCOUNTER — Other Ambulatory Visit: Payer: Self-pay

## 2022-06-23 NOTE — Discharge Instructions (Signed)
[facility] DISCHARGE INSTRUCTIONS FOR MYRINGOTOMY AND TUBE INSERTION  [facility] CREIGHTON VAUGHT, M.D.   Diet:   After surgery, the patient should take only liquids and foods as tolerated.  The patient may then have a regular diet after the effects of anesthesia have worn off, usually about four to six hours after surgery.  Activities:   The patient should rest until the effects of anesthesia have worn off.  After this, there are no restrictions on the normal daily activities.  Medications:   You will be given a prescription for antibiotic drops to be used in the ears postoperatively.  It is recommended to use 4 drops 2 times a day for 4 days, then the drops should be saved for possible future use.  The tubes should not cause any discomfort to the patient, but if there is any question, Tylenol should be given according to the instructions for the age of the patient.  Other medications should be continued normally.  Precautions:   Should there be recurrent drainage after the tubes are placed, the drops should be used for approximately 3-4 days.  If it does not clear, you should call the ENT office.  Earplugs:   Earplugs are only needed for those who are going to be submerged under water.  When taking a bath or shower and using a cup or showerhead to rinse hair, it is not necessary to wear earplugs.  These come in a variety of fashions, all of which can be obtained at our office.  However, if one is not able to come by the office, then silicone plugs can be found at most pharmacies.  It is not advised to stick anything in the ear that is not approved as an earplug.  Silly putty is not to be used as an earplug.  Swimming is allowed in patients after ear tubes are inserted, however, they must wear earplugs if they are going to be submerged under water.  For those children who are going to be swimming a lot, it is recommended to use a fitted ear mold, which can be  made by our audiologist.  If discharge is noticed from the ears, this most likely represents an ear infection.  We would recommend getting your eardrops and using them as indicated above.  If it does not clear, then you should call the ENT office.  For follow up, the patient should return to the ENT office three weeks postoperatively and then every six months as required by the doctor. 

## 2022-06-24 ENCOUNTER — Ambulatory Visit: Payer: HMO | Admitting: Anesthesiology

## 2022-06-24 ENCOUNTER — Ambulatory Visit
Admission: RE | Admit: 2022-06-24 | Discharge: 2022-06-24 | Disposition: A | Discharge status: Home / Self Care | Payer: HMO | Source: Ambulatory Visit | Attending: Otolaryngology | Admitting: Otolaryngology

## 2022-06-24 ENCOUNTER — Other Ambulatory Visit: Payer: Self-pay

## 2022-06-24 ENCOUNTER — Encounter
Admission: RE | Disposition: A | Discharge status: Home / Self Care | Payer: Self-pay | Source: Ambulatory Visit | Attending: Otolaryngology

## 2022-06-24 ENCOUNTER — Encounter: Payer: Self-pay | Admitting: Otolaryngology

## 2022-06-24 DIAGNOSIS — F17210 Nicotine dependence, cigarettes, uncomplicated: Secondary | ICD-10-CM | POA: Insufficient documentation

## 2022-06-24 DIAGNOSIS — H6693 Otitis media, unspecified, bilateral: Secondary | ICD-10-CM | POA: Insufficient documentation

## 2022-06-24 DIAGNOSIS — H6983 Other specified disorders of Eustachian tube, bilateral: Secondary | ICD-10-CM | POA: Diagnosis not present

## 2022-06-24 DIAGNOSIS — H6980 Other specified disorders of Eustachian tube, unspecified ear: Secondary | ICD-10-CM | POA: Diagnosis not present

## 2022-06-24 DIAGNOSIS — T7840XA Allergy, unspecified, initial encounter: Secondary | ICD-10-CM | POA: Diagnosis not present

## 2022-06-24 HISTORY — PX: MYRINGOTOMY WITH TUBE PLACEMENT: SHX5663

## 2022-06-24 SURGERY — MYRINGOTOMY WITH TUBE PLACEMENT
Anesthesia: General | Site: Ear | Laterality: Bilateral

## 2022-06-24 MED ORDER — CIPROFLOXACIN-DEXAMETHASONE 0.3-0.1 % OT SUSP
OTIC | Status: DC | PRN
  Administered 2022-06-24: 4 [drp] via OTIC

## 2022-06-24 MED ORDER — PROPOFOL 10 MG/ML IV BOLUS
INTRAVENOUS | Status: DC | PRN
  Administered 2022-06-24: 50 mg via INTRAVENOUS

## 2022-06-24 MED ORDER — LIDOCAINE HCL (CARDIAC) PF 100 MG/5ML IV SOSY
PREFILLED_SYRINGE | INTRAVENOUS | Status: DC | PRN
  Administered 2022-06-24: 30 mg via INTRAVENOUS

## 2022-06-24 MED ORDER — LACTATED RINGERS IV SOLN: INTRAVENOUS | Status: DC | PRN

## 2022-06-24 SURGICAL SUPPLY — 10 items
BALL CTTN LRG ABS STRL LF (GAUZE/BANDAGES/DRESSINGS) ×1
CANISTER SUCT 1200ML W/VALVE (MISCELLANEOUS) ×1 IMPLANT
COTTONBALL LRG STERILE PKG (GAUZE/BANDAGES/DRESSINGS) ×1 IMPLANT
GLOVE SURG GAMMEX PI TX LF 7.5 (GLOVE) ×1 IMPLANT
Myringotomy Blade IMPLANT
STRAP BODY AND KNEE 60X3 (MISCELLANEOUS) ×1 IMPLANT
TOWEL OR 17X26 4PK STRL BLUE (TOWEL DISPOSABLE) ×1 IMPLANT
TUBE EAR T 1.27X5.3 BFLY (OTOLOGIC RELATED) IMPLANT
TUBING CONN 6MMX3.1M (TUBING) ×1
TUBING SUCTION CONN 0.25 STRL (TUBING) ×1 IMPLANT

## 2022-06-24 NOTE — Op Note (Signed)
..  06/24/2022  8:32 AM    Lisa Wilkerson  UR:[id number]   Pre-Op Dx:  [name] tube dysfunction  Post-op Dx: Eustachian tube dysfunction  Proc:Bilateral myringotomy with tubes  Surg: Link Burgeson  Anes:  General by mask  EBL:  None  Comp:  None  Findings:  Bilateral Butterfly tubes placed posterior inferiorly.  Procedure: With the patient in a comfortable supine position, general mask anesthesia was administered.  At an appropriate level, microscope and speculum were used to examine and clean the RIGHT ear canal.  The findings were as described above.  An posterior inferior radial myringotomy incision was sharply executed.  Middle ear contents were suctioned clear with a size 5 otologic suction.  A PE tube was placed without difficulty using a Rosen pick and Animal nutritionist.  Ciprodex otic solution was instilled into the external canal, and insufflated into the middle ear.  A cotton ball was placed at the external meatus. Hemostasis was observed.  This side was completed.  After completing the RIGHT side, the LEFT side was done in identical fashion.    Following this  The patient was returned to anesthesia, awakened, and transferred to recovery in stable condition.  Dispo:  PACU to home  Plan: Routine drop use and water precautions.  Recheck my office three weeks.   Jeannie Fend [name] 8:32 AM 06/24/2022

## 2022-06-24 NOTE — Anesthesia Preprocedure Evaluation (Signed)
Anesthesia Evaluation  Patient identified by MRN, date of birth, ID band Patient awake    Reviewed: Allergy & Precautions, NPO status , Patient's Chart, lab work & pertinent test results  History of Anesthesia Complications Negative for: history of anesthetic complications  Airway Mallampati: III  TM Distance: <3 FB Neck ROM: full    Dental  (+) Upper Dentures, Lower Dentures   Pulmonary neg shortness of breath, sleep apnea , Current Smoker and Patient abstained from smoking.   Pulmonary exam normal        Cardiovascular Exercise Tolerance: Good hypertension, + CAD and + Past MI  Normal cardiovascular exam     Neuro/Psych TIA negative psych ROS   GI/Hepatic negative GI ROS, Neg liver ROS,,,  Endo/Other  negative endocrine ROS    Renal/GU Renal disease  negative genitourinary   Musculoskeletal   Abdominal   Peds  Hematology negative hematology ROS (+)   Anesthesia Other Findings Past Medical History: 10/29/2014: Actinic keratosis     Comment:  L hand thenar - biopsy proven 08/17/2017: Actinic keratosis     Comment:  L forearm - biopsy proven No date: Arthritis No date: Basal cell carcinoma of skin No date: Chronic kidney disease     Comment:  Stage 3 Chronic Kidney Disease No date: Coronary artery disease No date: Hyperlipidemia No date: Hypertension 07/2009: Myocardial infarction ([place]) No date: Peripheral vascular disease ([place]) No date: Psoriasis No date: Sleep apnea     Comment:  no CPAP 07/13/2017: Squamous cell carcinoma of skin     Comment:  L hand dorsum SCCIS 11/23/2017: Squamous cell carcinoma of skin     Comment:  L mid forearm  01/07/2022: Squamous cell carcinoma of skin     Comment:  Right Shoulder - Posterior, EDC 06/18/2014: Squamous cell skin cancer     Comment:  L lat lower leg below the knee 2014: TIA (transient ischemic attack)     Comment:  no deficits No date: Vertigo      Comment:  no issues for several years No date: Wears dentures     Comment:  full upper and lower  Past Surgical History: 2011: CARDIAC CATHETERIZATION 11/28/2015: ENDARTERECTOMY; Left     Comment:  Procedure: ENDARTERECTOMY CAROTID;  Surgeon: Algernon Huxley, MD;  Location: [facility] ORS;  Service: Vascular;                Laterality: Left; No date: EYE SURGERY; Bilateral     Comment:  Cataract Extraction with IOL 08/19/2016: MYRINGOTOMY WITH TUBE PLACEMENT; Bilateral     Comment:  Procedure: MYRINGOTOMY WITH TUBE PLACEMENT;  Surgeon:               Carloyn Manner, MD;  Location: [place];                Service: ENT;  Laterality: Bilateral;  sleep apnea 08/04/2017: MYRINGOTOMY WITH TUBE PLACEMENT; Bilateral     Comment:  Procedure: MYRINGOTOMY WITH TUBE PLACEMENT                         BUTTERFLY TUBES;  Surgeon: Carloyn Manner, MD;                Location: [place];  Service: ENT;                Laterality: Bilateral; 2000: UVULOPALATOPHARYNGOPLASTY  BMI  Body Mass Index: 20.42 kg/m      Reproductive/Obstetrics negative OB ROS                             Anesthesia Physical Anesthesia Plan  ASA: 3  Anesthesia Plan: General   Post-op Pain Management:    Induction: Intravenous  PONV Risk Score and Plan: Propofol infusion and TIVA  Airway Management Planned: Natural Airway and Nasal Cannula  Additional Equipment:   Intra-op Plan:   Post-operative Plan:   Informed Consent: I have reviewed the patients History and Physical, chart, labs and discussed the procedure including the risks, benefits and alternatives for the proposed anesthesia with the patient or authorized representative who has indicated his/her understanding and acceptance.     Dental Advisory Given  Plan Discussed with: Anesthesiologist, CRNA and Surgeon  Anesthesia Plan Comments: (Patient consented for risks of anesthesia including but not  limited to:  - adverse reactions to medications - risk of airway placement if required - damage to eyes, teeth, lips or other oral mucosa - nerve damage due to positioning  - sore throat or hoarseness - Damage to heart, brain, nerves, lungs, other parts of body or loss of life  Patient voiced understanding.)       Anesthesia Quick Evaluation

## 2022-06-24 NOTE — Anesthesia Postprocedure Evaluation (Signed)
Anesthesia Post Note  Patient: Lisa Wilkerson  Procedure(s) Performed: MYRINGOTOMY WITH TUBE PLACEMENT (Bilateral: Ear)  Patient location during evaluation: PACU Anesthesia Type: General Level of consciousness: awake and alert Pain management: pain level controlled Vital Signs Assessment: post-procedure vital signs reviewed and stable Respiratory status: spontaneous breathing, nonlabored ventilation, respiratory function stable and patient connected to nasal cannula oxygen Cardiovascular status: blood pressure returned to baseline and stable Postop Assessment: no apparent nausea or vomiting Anesthetic complications: no   No notable events documented.   Last Vitals:  Vitals:   06/24/22 0900 06/24/22 0905  BP: 116/67   Pulse: (!) 58 (!) 58  Resp: 15 15  Temp:    SpO2: 98% 97%    Last Pain:  Vitals:   06/24/22 0900  TempSrc:   PainSc: 0-No pain                 Precious Haws Aujanae Mccullum

## 2022-06-24 NOTE — H&P (Signed)
..  History and Physical paper copy reviewed and updated date of procedure and will be scanned into system.  Patient seen and examined.  

## 2022-06-24 NOTE — Transfer of Care (Signed)
Immediate Anesthesia Transfer of Care Note  Patient: Lisa Wilkerson  Procedure(s) Performed: MYRINGOTOMY WITH TUBE PLACEMENT (Bilateral: Ear)  Patient Location: PACU  Anesthesia Type: No value filed.  Level of Consciousness: awake, alert  and patient cooperative  Airway and Oxygen Therapy: Patient Spontanous Breathing and Patient connected to supplemental oxygen  Post-op Assessment: Post-op Vital signs reviewed, Patient's Cardiovascular Status Stable, Respiratory Function Stable, Patent Airway and No signs of Nausea or vomiting  Post-op Vital Signs: Reviewed and stable  Complications: No notable events documented.

## 2022-06-25 ENCOUNTER — Encounter: Payer: Self-pay | Admitting: Otolaryngology

## 2022-07-14 DIAGNOSIS — J449 Chronic obstructive pulmonary disease, unspecified: Secondary | ICD-10-CM | POA: Diagnosis not present

## 2022-07-16 DIAGNOSIS — H903 Sensorineural hearing loss, bilateral: Secondary | ICD-10-CM | POA: Diagnosis not present

## 2022-07-16 DIAGNOSIS — D110 Benign neoplasm of parotid gland: Secondary | ICD-10-CM | POA: Diagnosis not present

## 2022-07-16 DIAGNOSIS — H6983 Other specified disorders of Eustachian tube, bilateral: Secondary | ICD-10-CM | POA: Diagnosis not present

## 2022-07-28 DIAGNOSIS — H1132 Conjunctival hemorrhage, left eye: Secondary | ICD-10-CM | POA: Diagnosis not present

## 2022-08-06 ENCOUNTER — Ambulatory Visit (INDEPENDENT_AMBULATORY_CARE_PROVIDER_SITE_OTHER): Payer: HMO

## 2022-08-06 ENCOUNTER — Ambulatory Visit (INDEPENDENT_AMBULATORY_CARE_PROVIDER_SITE_OTHER): Payer: HMO | Admitting: Nurse Practitioner

## 2022-08-06 ENCOUNTER — Encounter (INDEPENDENT_AMBULATORY_CARE_PROVIDER_SITE_OTHER): Payer: PPO

## 2022-08-06 ENCOUNTER — Ambulatory Visit (INDEPENDENT_AMBULATORY_CARE_PROVIDER_SITE_OTHER): Payer: PPO | Admitting: Vascular Surgery

## 2022-08-06 ENCOUNTER — Encounter (INDEPENDENT_AMBULATORY_CARE_PROVIDER_SITE_OTHER): Payer: Self-pay | Admitting: Vascular Surgery

## 2022-08-06 VITALS — BP 159/78 | HR 66 | Resp 15 | Wt 109.0 lb

## 2022-08-06 DIAGNOSIS — I10 Essential (primary) hypertension: Secondary | ICD-10-CM

## 2022-08-06 DIAGNOSIS — I7143 Infrarenal abdominal aortic aneurysm, without rupture: Secondary | ICD-10-CM

## 2022-08-06 DIAGNOSIS — I6523 Occlusion and stenosis of bilateral carotid arteries: Secondary | ICD-10-CM

## 2022-08-06 DIAGNOSIS — E782 Mixed hyperlipidemia: Secondary | ICD-10-CM | POA: Diagnosis not present

## 2022-08-06 DIAGNOSIS — K551 Chronic vascular disorders of intestine: Secondary | ICD-10-CM

## 2022-08-06 NOTE — Progress Notes (Signed)
MRN : [id number]  Lisa Wilkerson is a 77 y.o. (December 28, 1945) female who presents with chief complaint of check carotid arteries.  History of Present Illness:   The patient is seen for follow up evaluation of carotid stenosis. The carotid stenosis followed by ultrasound.    The patient denies amaurosis fugax. There is no recent history of TIA symptoms or focal motor deficits. There is no prior documented CVA.   The patient is taking enteric-coated aspirin 81 mg daily.   There is no history of migraine headaches. There is no history of seizures.   No recent shortening of the patient's walking distance or new symptoms consistent with claudication.  No history of rest pain symptoms. No new ulcers or wounds of the lower extremities have occurred.   There is no history of DVT, PE or superficial thrombophlebitis. No recent episodes of angina or shortness of breath documented.    Duplex of the carotid arteries today shows less than 1-39% bilateral ICA stenosis and a 50% common carotid stenosis proximal to the widely  patent left carotid endarterectomy.    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  Today, AAA measures 4.51 cm with 1.2 cm left iliac artery aneurysm.  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   amLODipine (NORVASC) 5 MG tablet Take 1 tablet (5 mg total) by mouth daily.   aspirin 81 MG tablet Take 81 mg by mouth daily.   atorvastatin (LIPITOR) 40 MG tablet Take 40 mg by mouth daily.   clobetasol ointment (TEMOVATE) 0.05 % Apply topically as directed. Qd ti bid aa psoriasis on body until clear, then prn flares, avoid face, groin, axilla   clopidogrel (PLAVIX) 75 MG tablet Take 75 mg by mouth daily.    clotrimazole-betamethasone (LOTRISONE) cream Apply 1 application topically 2 (two) times daily as needed.    isosorbide mononitrate (IMDUR) 30 MG 24 hr tablet Take 30 mg by mouth daily.   lisinopril (ZESTRIL) 5 MG tablet Take 5 mg by mouth daily.   mirtazapine (REMERON) 15 MG tablet Take 15 mg by mouth at bedtime.   predniSONE (DELTASONE) 5 MG tablet Take 5 mg by mouth daily with breakfast.   Risankizumab-rzaa (SKYRIZI PEN) 150 MG/ML SOAJ Inject 150 mg into the skin as directed. Every 12 weeks for maintenance.    Past Medical History:  Diagnosis Date   Actinic keratosis 10/29/2014   L hand thenar - biopsy proven   Actinic keratosis 08/17/2017   L forearm - biopsy proven   Arthritis    Basal cell carcinoma of skin    Chronic kidney disease    Stage 3 Chronic Kidney Disease   Coronary artery disease    Hyperlipidemia    Hypertension    Myocardial infarction (HCC) 07/2009   Peripheral vascular disease (HCC)    Psoriasis    Sleep apnea    no CPAP   Squamous cell carcinoma of skin 07/13/2017   L hand dorsum SCCIS   Squamous cell carcinoma of skin 11/23/2017   L mid forearm    Squamous cell carcinoma of skin 01/07/2022   Right Shoulder - Posterior, [facility]   Squamous cell skin cancer  06/18/2014   L lat lower leg below the knee   TIA (transient ischemic attack) 2014   no deficits   Vertigo    no issues for several years   Wears dentures    full upper and lower    Past Surgical History:  Procedure Laterality Date   CARDIAC CATHETERIZATION  2011   ENDARTERECTOMY Left 11/28/2015   Procedure: ENDARTERECTOMY CAROTID;  Surgeon: Annice Needy, MD;  Location: [facility];  Service: Vascular;  Laterality: Left;   EYE SURGERY Bilateral    Cataract Extraction with IOL   MYRINGOTOMY WITH TUBE PLACEMENT Bilateral 08/19/2016   Procedure: MYRINGOTOMY WITH TUBE PLACEMENT;  Surgeon: Bud Face, MD;  Location: [facility];  Service: ENT;  Laterality: Bilateral;  sleep apnea    MYRINGOTOMY WITH TUBE PLACEMENT Bilateral 08/04/2017   Procedure: MYRINGOTOMY WITH TUBE PLACEMENT           BUTTERFLY TUBES;  Surgeon: Bud Face, MD;  Location: [facility];  Service: ENT;  Laterality: Bilateral;   MYRINGOTOMY WITH TUBE PLACEMENT Bilateral 06/24/2022   Procedure: MYRINGOTOMY WITH TUBE PLACEMENT;  Surgeon: Bud Face, MD;  Location: [facility];  Service: ENT;  Laterality: Bilateral;   UVULOPALATOPHARYNGOPLASTY  2000    Social History Social History   Tobacco Use   Smoking status: Every Day    Packs/day: 1.00    Years: 59.00    Additional pack years: 0.00    Total pack years: 59.00    Types: Cigarettes   Smokeless tobacco: Never  Vaping Use   Vaping Use: Never used  Substance Use Topics   Alcohol use: No   Drug use: No    Family History No family history on file.  Allergies  Allergen Reactions   Codeine Other (See Comments)    She states it paralyzes her and she can't move. Other reaction(s): Other (see comments) Other reaction(s): Other (See Comments) She states it paralyzes her and she can't move. "Cannot move, paralyzes me"     REVIEW OF SYSTEMS (Negative unless checked)  Constitutional: [] Weight loss  [] Fever  [] Chills Cardiac: [] Chest pain   [] Chest pressure   [] Palpitations   [] Shortness of breath when laying flat   [] Shortness of breath with exertion. Vascular:  [] Pain in legs with walking   [] Pain in legs at rest  [] History of DVT   [] Phlebitis   [] Swelling in legs   [] Varicose veins   [] Non-healing ulcers Pulmonary:   [] Uses home oxygen   [] Productive cough   [] Hemoptysis   [] Wheeze  [] COPD   [] Asthma Neurologic:  [] Dizziness   [] Seizures   [] History of stroke   [] History of TIA  [] Aphasia   [] Vissual changes   [] Weakness or numbness in arm   [] Weakness or numbness in leg Musculoskeletal:   [] Joint swelling   [] Joint pain   [] Low back pain Hematologic:  [] Easy bruising  [] Easy bleeding   [] Hypercoagulable state    [] Anemic Gastrointestinal:  [] Diarrhea   [] Vomiting  [] Gastroesophageal reflux/heartburn   [] Difficulty swallowing. Genitourinary:  [] Chronic kidney disease   [] Difficult urination  [] Frequent urination   [] Blood in urine Skin:  [] Rashes   [] Ulcers  Psychological:  [] History of anxiety   []  History of major depression.  Physical Examination  Vitals:   08/06/22 1005  BP: (!) 159/78  Pulse: 66  Resp: 15  Weight: 109 lb (49.4 kg)   Body mass index is 19.93 kg/m. Gen: WD/WN, NAD Head: [place]/AT, No temporalis wasting.  Ear/Nose/Throat: Hearing grossly intact, nares  w/o erythema or drainage Eyes: PER, EOMI, sclera nonicteric.  Neck: Supple, no masses.  No bruit or JVD.  Pulmonary:  Good air movement, no audible wheezing, no use of accessory muscles.  Cardiac: RRR, normal S1, S2, no Murmurs. Vascular:  carotid bruit noted Vessel Right Left  Radial Palpable Palpable  Carotid  Palpable  Palpable  Subclav  Palpable Palpable  Gastrointestinal: soft, non-distended. No guarding/no peritoneal signs.  Musculoskeletal: M/S 5/5 throughout.  No visible deformity.  Neurologic: CN 2-12 intact. Pain and light touch intact in extremities.  Symmetrical.  Speech is fluent. Motor exam as listed above. Psychiatric: Judgment intact, Mood & affect appropriate for pt's clinical situation. Dermatologic: No rashes or ulcers noted.  No changes consistent with cellulitis.   CBC Lab Results  Component Value Date   WBC 5.4 02/07/2018   HGB 12.4 02/07/2018   HCT 38.8 02/07/2018   MCV 86.8 02/07/2018   PLT 254 02/07/2018    BMET    Component Value Date/Time   NA 137 02/07/2018 0541   K 3.8 02/07/2018 0541   CL 103 02/07/2018 0541   CO2 26 02/07/2018 0541   GLUCOSE 135 (H) 02/07/2018 0541   BUN 23 02/07/2018 0541   CREATININE 1.25 (H) 02/07/2018 0541   CALCIUM 8.5 (L) 02/07/2018 0541   GFRNONAA 42 (L) 02/07/2018 0541   GFRAA 49 (L) 02/07/2018 0541   CrCl cannot be calculated (Patient's most recent  lab result is older than the maximum 21 days allowed.).  COAG Lab Results  Component Value Date   INR 0.95 02/06/2018   INR 0.87 11/22/2015    Radiology VAS US CAROTID  Result Date: 08/06/2022 Carotid Arterial Duplex Study Patient Name:  Lisa Wilkerson  Date of Exam:   08/06/2022 Medical Rec #: [id number]        Accession #:    [id number] Date of Birth: 1945-07-08        Patient Gender: F Patient Age:   72 years Exam [facility] Procedure:      VAS US CAROTID Referring Phys: Levora Dredge --------------------------------------------------------------------------------  Indications:   TIA, Carotid artery disease and left endarterectomy. Risk Factors:  Hypertension, hyperlipidemia, current smoker, prior MI, coronary                artery disease. Other Factors: Left CEA 2017. Performing Technologist: Hardie Lora RVT  Examination Guidelines: A complete evaluation includes B-mode imaging, spectral Doppler, color Doppler, and power Doppler as needed of all accessible portions of each vessel. Bilateral testing is considered an integral part of a complete examination. Limited examinations for reoccurring indications may be performed as noted.  Right Carotid Findings: +----------+--------+--------+--------+--------------------------+--------+           PSV cm/sEDV cm/sStenosisPlaque Description        Comments +----------+--------+--------+--------+--------------------------+--------+ CCA Prox  44      9                                                  +----------+--------+--------+--------+--------------------------+--------+ CCA Mid   55      12      <50%    heterogenous and irregular         +----------+--------+--------+--------+--------------------------+--------+ CCA Distal44      10                                                 +----------+--------+--------+--------+--------------------------+--------+  ICA Prox  93      24      1-39%   calcific  and irregular             +----------+--------+--------+--------+--------------------------+--------+ ICA Mid   105     32                                                 +----------+--------+--------+--------+--------------------------+--------+ ICA Distal55      16                                                 +----------+--------+--------+--------+--------------------------+--------+ ECA       99      9                                                  +----------+--------+--------+--------+--------------------------+--------+ +----------+--------+-------+----------------+-------------------+           PSV cm/sEDV cmsDescribe        Arm Pressure (mmHG) +----------+--------+-------+----------------+-------------------+ [id number]             Multiphasic, WNL                    +----------+--------+-------+----------------+-------------------+ +---------+--------+--+--------+--+---------+ VertebralPSV cm/s48EDV cm/s14Antegrade +---------+--------+--+--------+--+---------+  Left Carotid Findings: +----------+-------+--------+--------+-----------------+-----------------------+           PSV    EDV cm/sStenosisPlaque           Comments                          cm/s                   Description                              +----------+-------+--------+--------+-----------------+-----------------------+ CCA Prox  46     12                                                       +----------+-------+--------+--------+-----------------+-----------------------+ CCA Mid   144    29      >50%    smooth                                   +----------+-------+--------+--------+-----------------+-----------------------+ CCA Distal137    23                               Post stenotic  turbulence              +----------+-------+--------+--------+-----------------+-----------------------+  ICA Prox  98     24      1-39%   smooth                                   +----------+-------+--------+--------+-----------------+-----------------------+ ICA Mid   127    33                                                       +----------+-------+--------+--------+-----------------+-----------------------+ ICA Distal117    29                                                       +----------+-------+--------+--------+-----------------+-----------------------+ ECA       67     10                                                       +----------+-------+--------+--------+-----------------+-----------------------+ +----------+--------+--------+----------------+-------------------+           PSV cm/sEDV cm/sDescribe        Arm Pressure (mmHG) +----------+--------+--------+----------------+-------------------+ [id number]              Multiphasic, WNL                    +----------+--------+--------+----------------+-------------------+ +---------+--------+--+--------+--+---------+ VertebralPSV cm/s35EDV cm/s12Antegrade +---------+--------+--+--------+--+---------+   Summary: Right Carotid: Velocities in the right ICA are consistent with a 1-39% stenosis.                Non-hemodynamically significant plaque <50% noted in the CCA. Left Carotid: Velocities in the left ICA are consistent with a 1-39% stenosis.               Hemodynamically significant plaque >50% visualized in the CCA. Vertebrals:  Bilateral vertebral arteries demonstrate antegrade flow. Subclavians: Normal flow hemodynamics were seen in bilateral subclavian              arteries. *See table(s) above for measurements and observations.  Electronically signed by Levora Dredge MD on 08/06/2022 at 3:23:09 PM.    Final    VAS US AORTA/IVC/ILIACS  Result Date: 08/06/2022 ABDOMINAL AORTA STUDY Patient Name:  Lisa Wilkerson  Date of Exam:   08/06/2022 Medical Rec #: [id number]        Accession #:    [id number] Date  of Birth: 1946-03-23        Patient Gender: F Patient Age:   17 years Exam Location:  [facility] Procedure:      VAS US AORTA/IVC/ILIACS Referring Phys: Levora Dredge --------------------------------------------------------------------------------  Indications: Follow up exam for known AAA. Risk Factors: Hypertension, hyperlipidemia, current smoker.  Performing Technologist: Hardie Lora RVT  Examination Guidelines: A complete evaluation includes B-mode imaging, spectral Doppler, color Doppler, and power Doppler as needed of all accessible portions of each vessel. Bilateral testing is considered an integral part of a complete examination. Limited examinations for  reoccurring indications may be performed as noted.  Abdominal Aorta Findings: +-----------+-------+----------+----------+--------+--------+--------+ Location   AP (cm)Trans (cm)PSV (cm/s)WaveformThrombusComments +-----------+-------+----------+----------+--------+--------+--------+ Proximal   2.68   2.66      46                                 +-----------+-------+----------+----------+--------+--------+--------+ Mid        4.51   3.92      25                                 +-----------+-------+----------+----------+--------+--------+--------+ Distal     2.96   3.09      32                                 +-----------+-------+----------+----------+--------+--------+--------+ RT CIA Prox1.1    1.2       58                                 +-----------+-------+----------+----------+--------+--------+--------+ LT CIA Prox1.2    1.2       139                                +-----------+-------+----------+----------+--------+--------+--------+ Proximal SMA peak systolic velocity 402 cm/sec.  Summary: Abdominal Aorta: There is evidence of abnormal dilatation of the mid Abdominal aorta. The largest aortic diameter has increased compared to prior exam. Previous diameter measurement was 3.9 cm obtained  on 06/26/2021. 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  Electronically signed by Levora Dredge MD on 08/06/2022 at 3:23:03 PM.    Final      Assessment/Plan 1. Bilateral carotid artery stenosis Recommend:  Given the patient's asymptomatic subcritical stenosis no further invasive testing or surgery at this time.  Duplex ultrasound shows 1-39% stenosis bilaterally.  Continue antiplatelet therapy as prescribed Continue management of CAD, HTN and Hyperlipidemia Healthy heart diet,  encouraged exercise at least 4 times per week Follow up in 12 months with duplex ultrasound and physical exam   2. Infrarenal abdominal aortic aneurysm (AAA) without rupture (HCC) Recommend:  The patient has an abdominal aortic aneurysm that has shown what is concerning for rapid grown and based on this study it appears that it is suitable for endovascular treatment.  The patient is otherwise in reasonable health.   Therefore, the patient should undergo endovascular repair of the AAA to prevent future lethal rupture.   Patient will require CT angiography of the abdomen and pelvis in order to appropriately plan repair of the AAA.  The risks and benefits as well as the alternative therapies was discussed in detail with the patient. All questions were answered. The patient agrees to move forward the AAA repair.  Therefore a CT angiogram with be scheduled as an outpatient.  The patient will follow up with me in the office after the CT scan to review the study and finalize plan for repair. - CT Angio Abd/Pel w/ and/or w/o; Future  3. Essential hypertension, benign Continue antihypertensive medications as already ordered, these medications have been reviewed and there are no changes at this time.  4. Mixed hyperlipidemia Continue statin as ordered and reviewed, no changes at  this time    5. Mesenteric artery stenosis (HCC) The patient currently does not have any  significant symptoms concerning for mesenteric stenosis.  Will continue to monitor this and we will also be able to evaluate somewhat better on her CT scan as well.     Georgiana Spinner, NP  08/14/2022 9:36 AM

## 2022-08-14 ENCOUNTER — Encounter (INDEPENDENT_AMBULATORY_CARE_PROVIDER_SITE_OTHER): Payer: Self-pay | Admitting: Nurse Practitioner

## 2022-08-17 ENCOUNTER — Ambulatory Visit: Payer: HMO | Attending: Cardiology | Admitting: Cardiology

## 2022-08-17 ENCOUNTER — Encounter: Payer: Self-pay | Admitting: Cardiology

## 2022-08-17 ENCOUNTER — Telehealth (INDEPENDENT_AMBULATORY_CARE_PROVIDER_SITE_OTHER): Payer: Self-pay | Admitting: Nurse Practitioner

## 2022-08-17 VITALS — BP 100/60 | HR 70 | Ht 62.0 in | Wt 105.0 lb

## 2022-08-17 DIAGNOSIS — I2510 Atherosclerotic heart disease of native coronary artery without angina pectoris: Secondary | ICD-10-CM | POA: Diagnosis not present

## 2022-08-17 DIAGNOSIS — F17200 Nicotine dependence, unspecified, uncomplicated: Secondary | ICD-10-CM | POA: Diagnosis not present

## 2022-08-17 DIAGNOSIS — I10 Essential (primary) hypertension: Secondary | ICD-10-CM | POA: Diagnosis not present

## 2022-08-17 NOTE — Telephone Encounter (Signed)
LVM for pt to call Radiology scheduling and make the CT appt ordered by Sheppard Plumber, NP. After making the CT appt, I asked for the pt TCB to clinic and make a CT results appt.

## 2022-08-17 NOTE — Progress Notes (Signed)
Cardiology Office Note:    Date:  08/17/2022   ID:  Lisa Wilkerson, DOB 11-20-1945, MRN [id number]  PCP:  Lisa Ivan, MD   [facility] Providers Cardiologist:  Julien Nordmann, MD     Referring MD: Lisa Ivan, MD   Chief Complaint  Patient presents with   Follow-up    6 month f/u no complaints today.Meds reviewed verbally with pt.    History of Present Illness:    Lisa Wilkerson is a 77 y.o. female with a hx of CAD (three-vessel coronary calcification on chest CT), PAD, left ICA stenosis s/p CEA 2017, hypertension, hyperlipidemia, current smoker, COPD who presents for follow-up.  Denies chest pain, denies any shortness of breath more than usual.  She still smokes.  Occasional wheezing.  Denies edema.  Compliant with medications as prescribed.  Denies dizziness, palpitations or syncope.  Previous notes/studies Echocardiogram at [facility] cardiology 06/2020 EF greater than 55%, impaired relaxation Chest CT lung cancer screening 12/2021 three-vessel coronary calcification  Past Medical History:  Diagnosis Date   Actinic keratosis 10/29/2014   L hand thenar - biopsy proven   Actinic keratosis 08/17/2017   L forearm - biopsy proven   Arthritis    Basal cell carcinoma of skin    Chronic kidney disease    Stage 3 Chronic Kidney Disease   Coronary artery disease    Hyperlipidemia    Hypertension    Myocardial infarction (HCC) 07/2009   Peripheral vascular disease (HCC)    Psoriasis    Sleep apnea    no CPAP   Squamous cell carcinoma of skin 07/13/2017   L hand dorsum SCCIS   Squamous cell carcinoma of skin 11/23/2017   L mid forearm    Squamous cell carcinoma of skin 01/07/2022   Right Shoulder - Posterior, EDC   Squamous cell skin cancer 06/18/2014   L lat lower leg below the knee   TIA (transient ischemic attack) 2014   no deficits   Vertigo    no issues for several years   Wears dentures    full upper and lower    Past Surgical History:   Procedure Laterality Date   CARDIAC CATHETERIZATION  2011   ENDARTERECTOMY Left 11/28/2015   Procedure: ENDARTERECTOMY CAROTID;  Surgeon: Annice Needy, MD;  Location: [facility] ORS;  Service: Vascular;  Laterality: Left;   EYE SURGERY Bilateral    Cataract Extraction with IOL   MYRINGOTOMY WITH TUBE PLACEMENT Bilateral 08/19/2016   Procedure: MYRINGOTOMY WITH TUBE PLACEMENT;  Surgeon: Bud Face, MD;  Location: [facility];  Service: ENT;  Laterality: Bilateral;  sleep apnea   MYRINGOTOMY WITH TUBE PLACEMENT Bilateral 08/04/2017   Procedure: MYRINGOTOMY WITH TUBE PLACEMENT           BUTTERFLY TUBES;  Surgeon: Bud Face, MD;  Location: [facility];  Service: ENT;  Laterality: Bilateral;   MYRINGOTOMY WITH TUBE PLACEMENT Bilateral 06/24/2022   Procedure: MYRINGOTOMY WITH TUBE PLACEMENT;  Surgeon: Bud Face, MD;  Location: [facility];  Service: ENT;  Laterality: Bilateral;   UVULOPALATOPHARYNGOPLASTY  2000    Current Medications: Current Meds  Medication Sig   albuterol (VENTOLIN HFA) 108 (90 Base) MCG/ACT inhaler Inhale 1-2 puffs into the lungs every 6 (six) hours as needed for wheezing or shortness of breath.   amLODipine (NORVASC) 5 MG tablet Take 1 tablet (5 mg total) by mouth daily.   aspirin 81 MG tablet Take 81 mg by mouth daily.   atorvastatin (LIPITOR) 40  MG tablet Take 40 mg by mouth daily.   Cholecalciferol 25 MCG (1000 UT) tablet Take by mouth.   clobetasol ointment (TEMOVATE) 0.05 % Apply topically as directed. Qd ti bid aa psoriasis on body until clear, then prn flares, avoid face, groin, axilla   clopidogrel (PLAVIX) 75 MG tablet Take 75 mg by mouth daily.   clotrimazole-betamethasone (LOTRISONE) cream Apply 1 application topically 2 (two) times daily as needed.    isosorbide mononitrate (IMDUR) 30 MG 24 hr tablet Take 30 mg by mouth daily.   lisinopril (ZESTRIL) 5 MG tablet Take 5 mg by mouth daily.   mirtazapine (REMERON) 15 MG tablet  Take 15 mg by mouth at bedtime.   predniSONE (DELTASONE) 5 MG tablet Take 5 mg by mouth daily with breakfast.   Risankizumab-rzaa (SKYRIZI PEN) 150 MG/ML SOAJ Inject 150 mg into the skin as directed. Every 12 weeks for maintenance.     Allergies:   Codeine   Social History   Socioeconomic History   Marital status: Widowed    Spouse name: Not on file   Number of children: Not on file   Years of education: Not on file   Highest education level: Not on file  Occupational History   Not on file  Tobacco Use   Smoking status: Every Day    Packs/day: 1.00    Years: 59.00    Additional pack years: 0.00    Total pack years: 59.00    Types: Cigarettes   Smokeless tobacco: Never  Vaping Use   Vaping Use: Never used  Substance and Sexual Activity   Alcohol use: No   Drug use: No   Sexual activity: Not Currently  Other Topics Concern   Not on file  Social History Narrative   Not on file   Social Determinants of Health   Financial Resource Strain: Low Risk  (02/06/2018)   Overall Financial Resource Strain (CARDIA)    Difficulty of Paying Living Expenses: Not hard at all  Food Insecurity: No Food Insecurity (02/06/2018)   Hunger Vital Sign    Worried About Running Out of Food in the Last Year: Never true    Ran Out of Food in the Last Year: Never true  Transportation Needs: No Transportation Needs (02/06/2018)   PRAPARE - Administrator, Civil Service (Medical): No    Lack of Transportation (Non-Medical): No  Physical Activity: Inactive (02/06/2018)   Exercise Vital Sign    Days of Exercise per Week: 0 days    Minutes of Exercise per Session: 0 min  Stress: No Stress Concern Present (02/06/2018)   [employer] of Occupational Health - Occupational Stress Questionnaire    Feeling of Stress : Not at all  Social Connections: Unknown (02/06/2018)   Social Connection and Isolation Panel [NHANES]    Frequency of Communication with Friends and Family: Patient declined     Frequency of Social Gatherings with Friends and Family: Patient declined    Attends Religious Services: Patient declined    Database administrator or Organizations: Patient declined    Attends Banker Meetings: Patient declined    Marital Status: Patient declined     Family History: The patient's family history is not on file.  ROS:   Please see the history of present illness.     All other systems reviewed and are negative.  EKGs/Labs/Other Studies Reviewed:    The following studies were reviewed today:   EKG:  EKG is  ordered today.  The ekg ordered today demonstrates normal sinus rhythm, normal ECG  Recent Labs: No results found for requested labs within last 365 days.  Recent Lipid Panel No results found for: "CHOL", "TRIG", "HDL", "CHOLHDL", "VLDL", "LDLCALC", "LDLDIRECT"   Risk Assessment/Calculations:             Physical Exam:    VS:  BP 100/60 (BP Location: Left Arm, Patient Position: Sitting, Cuff Size: Normal)   Pulse 70   Ht 5\' 2"  (1.575 m)   Wt 105 lb (47.6 kg)   SpO2 92%   BMI 19.20 kg/m     Wt Readings from Last 3 Encounters:  08/17/22 105 lb (47.6 kg)  08/06/22 109 lb (49.4 kg)  06/24/22 111 lb 11.2 oz (50.7 kg)     GEN:  Well nourished, well developed in no acute distress HEENT: Normal NECK: No JVD; No carotid bruits CARDIAC: RRR, no murmurs, rubs, gallops RESPIRATORY: Inspiratory wheezing ABDOMEN: Soft, non-tender, non-distended MUSCULOSKELETAL:  No edema; No deformity  SKIN: Warm and dry NEUROLOGIC:  Alert and oriented x 3 PSYCHIATRIC:  Normal affect   ASSESSMENT:    1. Coronary artery disease involving native coronary artery of native heart, unspecified whether angina present   2. Primary hypertension   3. Smoking    PLAN:    In order of problems listed above:  CAD/three-vessel coronary calcifications, denies chest pain.  Last EF normal.  Continue aspirin, Plavix, Lipitor. Hypertension, BP controlled, continue  Norvasc, Imdur. Current smoker, smoking cessation strongly advised.  Follow-up in 1 year with Dr. Mariah Wilkerson or APP.     Medication Adjustments/Labs and Tests Ordered: Current medicines are reviewed at length with the patient today.  Concerns regarding medicines are outlined above.  Orders Placed This Encounter  Procedures   EKG 12-Lead   No orders of the defined types were placed in this encounter.   Patient Instructions  Medication Instructions:   Your physician recommends that you continue on your current medications as directed. Please refer to the Current Medication list given to you today.  *If you need a refill on your cardiac medications before your next appointment, please call your pharmacy*   Lab Work:  None Ordered  If you have labs (blood work) drawn today and your tests are completely normal, you will receive your results only by: MyChart Message (if you have MyChart) OR A paper copy in the mail If you have any lab test that is abnormal or we need to change your treatment, we will call you to review the results.   Testing/Procedures:  None Ordered    Follow-Up: At [facility], you and your health needs are our priority.  As part of our continuing mission to provide you with exceptional heart care, we have created designated Provider Care Teams.  These Care Teams include your primary Cardiologist (physician) and Advanced Practice Providers (APPs -  Physician Assistants and Nurse Practitioners) who all work together to provide you with the care you need, when you need it.  We recommend signing up for the patient portal called "MyChart".  Sign up information is provided on this After Visit Summary.  MyChart is used to connect with patients for Virtual Visits (Telemedicine).  Patients are able to view lab/test results, encounter notes, upcoming appointments, etc.  Non-urgent messages can be sent to your provider as well.   To learn more about what you can do  with MyChart, go to [email or web address].    Your next appointment:   12 month(s)  Provider:   You may see Julien Nordmann, MD or one of the following Advanced Practice Providers on your designated Care Team:   Nicolasa Ducking, NP Eula Listen, PA-C Cadence Fransico Michael, PA-C Charlsie Quest, NP    Signed, Lisa Odea, MD  08/17/2022 12:08 PM    [facility]

## 2022-08-17 NOTE — Patient Instructions (Signed)
Medication Instructions:   Your physician recommends that you continue on your current medications as directed. Please refer to the Current Medication list given to you today *If you need a refill on your cardiac medications before your next appointment, please call your pharmacy*   Lab Work:  None Ordered  If you have labs (blood work) drawn today and your tests are completely normal, you will receive your results only by: MyChart Message (if you have MyChart) OR A paper copy in the mail If you have any lab test that is abnormal or we need to change your treatment, we will call you to review the results.   Testing/Procedures:  None Ordered   Follow-Up: At [facility], you and your health needs are our priority.  As part of our continuing mission to provide you with exceptional heart care, we have created designated Provider Care Teams.  These Care Teams include your primary Cardiologist (physician) and Advanced Practice Providers (APPs -  Physician Assistants and Nurse Practitioners) who all work together to provide you with the care you need, when you need it.  We recommend signing up for the patient portal called "MyChart".  Sign up information is provided on this After Visit Summary.  MyChart is used to connect with patients for Virtual Visits (Telemedicine).  Patients are able to view lab/test results, encounter notes, upcoming appointments, etc.  Non-urgent messages can be sent to your provider as well.   To learn more about what you can do with MyChart, go to [email or web address].com.    Your next appointment:   12 month(s)  Provider:   You may see Timothy Gollan, MD or one of the following Advanced Practice Providers on your designated Care Team:   Christopher Berge, NP Ryan Dunn, PA-C Cadence Furth, PA-C Sheri Hammock, NP 

## 2022-08-21 ENCOUNTER — Ambulatory Visit
Admission: RE | Admit: 2022-08-21 | Discharge: 2022-08-21 | Disposition: A | Discharge status: Home / Self Care | Payer: HMO | Source: Ambulatory Visit | Attending: Nurse Practitioner | Admitting: Nurse Practitioner

## 2022-08-21 ENCOUNTER — Other Ambulatory Visit (INDEPENDENT_AMBULATORY_CARE_PROVIDER_SITE_OTHER): Payer: Self-pay | Admitting: Nurse Practitioner

## 2022-08-21 DIAGNOSIS — K551 Chronic vascular disorders of intestine: Secondary | ICD-10-CM

## 2022-08-21 DIAGNOSIS — I700 Atherosclerosis of aorta: Secondary | ICD-10-CM | POA: Diagnosis not present

## 2022-08-21 DIAGNOSIS — I7143 Infrarenal abdominal aortic aneurysm, without rupture: Secondary | ICD-10-CM

## 2022-08-21 DIAGNOSIS — I6523 Occlusion and stenosis of bilateral carotid arteries: Secondary | ICD-10-CM

## 2022-08-21 DIAGNOSIS — I10 Essential (primary) hypertension: Secondary | ICD-10-CM

## 2022-08-21 DIAGNOSIS — K6389 Other specified diseases of intestine: Secondary | ICD-10-CM | POA: Diagnosis not present

## 2022-08-21 DIAGNOSIS — E782 Mixed hyperlipidemia: Secondary | ICD-10-CM

## 2022-08-21 LAB — POCT I-STAT CREATININE: Creatinine, Ser: 2 mg/dL — ABNORMAL HIGH (ref 0.44–1.00)

## 2022-08-27 ENCOUNTER — Ambulatory Visit (INDEPENDENT_AMBULATORY_CARE_PROVIDER_SITE_OTHER): Payer: HMO | Admitting: Vascular Surgery

## 2022-09-07 ENCOUNTER — Telehealth: Payer: Self-pay | Admitting: *Deleted

## 2022-09-07 NOTE — Telephone Encounter (Signed)
Called and spoke with April at Dr Terence Lux office. PT is scheduled for a CT Chest LCS Nodule f/u CT on 09/17/22 but we were unable to get a PA done due to a PA already being done through Dr Terence Lux office. She states that their PA does not expire until 10/13/22. She has sent a message to have the nurse to call to see what we need to do to go ahead and get the pt scanned. Will await call back.

## 2022-09-08 ENCOUNTER — Ambulatory Visit (INDEPENDENT_AMBULATORY_CARE_PROVIDER_SITE_OTHER): Payer: HMO | Admitting: Dermatology

## 2022-09-08 VITALS — BP 162/85

## 2022-09-08 DIAGNOSIS — L821 Other seborrheic keratosis: Secondary | ICD-10-CM

## 2022-09-08 DIAGNOSIS — L578 Other skin changes due to chronic exposure to nonionizing radiation: Secondary | ICD-10-CM

## 2022-09-08 DIAGNOSIS — Z85828 Personal history of other malignant neoplasm of skin: Secondary | ICD-10-CM | POA: Diagnosis not present

## 2022-09-08 DIAGNOSIS — X32XXXA Exposure to sunlight, initial encounter: Secondary | ICD-10-CM

## 2022-09-08 DIAGNOSIS — L720 Epidermal cyst: Secondary | ICD-10-CM | POA: Diagnosis not present

## 2022-09-08 DIAGNOSIS — Z86007 Personal history of in-situ neoplasm of skin: Secondary | ICD-10-CM

## 2022-09-08 DIAGNOSIS — L409 Psoriasis, unspecified: Secondary | ICD-10-CM

## 2022-09-08 DIAGNOSIS — W908XXA Exposure to other nonionizing radiation, initial encounter: Secondary | ICD-10-CM | POA: Diagnosis not present

## 2022-09-08 DIAGNOSIS — Z79620 Long term (current) use of immunosuppressive biologic: Secondary | ICD-10-CM

## 2022-09-08 DIAGNOSIS — Z5181 Encounter for therapeutic drug level monitoring: Secondary | ICD-10-CM

## 2022-09-08 DIAGNOSIS — L570 Actinic keratosis: Secondary | ICD-10-CM | POA: Diagnosis not present

## 2022-09-08 MED ORDER — SKYRIZI PEN 150 MG/ML ~~LOC~~ SOAJ: 150.0000 mg | SUBCUTANEOUS | 3 refills | Status: DC

## 2022-09-08 NOTE — Patient Instructions (Addendum)
Cryotherapy Aftercare  Wash gently with soap and water everyday.   Apply Vaseline and Band-Aid daily until healed.     Due to recent changes in healthcare laws, you may see results of your pathology and/or laboratory studies on MyChart before the doctors have had a chance to review them. We understand that in some cases there may be results that are confusing or concerning to you. Please understand that not all results are received at the same time and often the doctors may need to interpret multiple results in order to provide you with the best plan of care or course of treatment. Therefore, we ask that you please give us 2 business days to thoroughly review all your results before contacting the office for clarification. Should we see a critical lab result, you will be contacted sooner.   If You Need Anything After Your Visit  If you have any questions or concerns for your doctor, please call our main line at [phone] and press option 4 to reach your doctor's medical assistant. If no one answers, please leave a voicemail as directed and we will return your call as soon as possible. Messages left after 4 pm will be answered the following business day.   You may also send us a message via MyChart. We typically respond to MyChart messages within 1-2 business days.  For prescription refills, please ask your pharmacy to contact our office. Our fax number is [phone].  If you have an urgent issue when the clinic is closed that cannot wait until the next business day, you can page your doctor at the number below.    Please note that while we do our best to be available for urgent issues outside of office hours, we are not available 24/7.   If you have an urgent issue and are unable to reach us, you may choose to seek medical care at your doctor's office, retail clinic, urgent care center, or emergency room.  If you have a medical emergency, please immediately call 911 or go to the  emergency department.  Pager Numbers  - Dr. Kowalski: [phone]  - Dr. Moye: [phone]  - Dr. Stewart: [phone]  In the event of inclement weather, please call our main line at [phone] for an update on the status of any delays or closures.  Dermatology Medication Tips: Please keep the boxes that topical medications come in in order to help keep track of the instructions about where and how to use these. Pharmacies typically print the medication instructions only on the boxes and not directly on the medication tubes.   If your medication is too expensive, please contact our office at [phone] option 4 or send us a message through MyChart.   We are unable to tell what your co-pay for medications will be in advance as this is different depending on your insurance coverage. However, we may be able to find a substitute medication at lower cost or fill out paperwork to get insurance to cover a needed medication.   If a prior authorization is required to get your medication covered by your insurance company, please allow us 1-2 business days to complete this process.  Drug prices often vary depending on where the prescription is filled and some pharmacies may offer cheaper prices.  The website www.goodrx.com contains coupons for medications through different pharmacies. The prices here do not account for what the cost may be with help from insurance (it may be cheaper with your insurance), but the website can   give you the price if you did not use any insurance.  - You can print the associated coupon and take it with your prescription to the pharmacy.  - You may also stop by our office during regular business hours and pick up a GoodRx coupon card.  - If you need your prescription sent electronically to a different pharmacy, notify our office through [facility] MyChart or by phone at [phone] option 4.     Si Usted Necesita Algo Despus de Su Visita  Tambin puede  enviarnos un mensaje a travs de MyChart. Por lo general respondemos a los mensajes de MyChart en el transcurso de 1 a 2 das hbiles.  Para renovar recetas, por favor pida a su farmacia que se ponga en contacto con nuestra oficina. Nuestro nmero de fax es el [phone].  Si tiene un asunto urgente cuando la clnica est cerrada y que no puede esperar hasta el siguiente da hbil, puede llamar/localizar a su doctor(a) al nmero que aparece a continuacin.   Por favor, tenga en cuenta que aunque hacemos todo lo posible para estar disponibles para asuntos urgentes fuera del horario de oficina, no estamos disponibles las 24 horas del da, los 7 das de la semana.   Si tiene un problema urgente y no puede comunicarse con nosotros, puede optar por buscar atencin mdica  en el consultorio de su doctor(a), en una clnica privada, en un centro de atencin urgente o en una sala de emergencias.  Si tiene una emergencia mdica, por favor llame inmediatamente al 911 o vaya a la sala de emergencias.  Nmeros de bper  - Dr. Kowalski: [phone]  - Dra. Moye: [phone]  - Dra. Stewart: [phone]  En caso de inclemencias del tiempo, por favor llame a nuestra lnea principal al [phone] para una actualizacin sobre el estado de cualquier retraso o cierre.  Consejos para la medicacin en dermatologa: Por favor, guarde las cajas en las que vienen los medicamentos de uso tpico para ayudarle a seguir las instrucciones sobre dnde y cmo usarlos. Las farmacias generalmente imprimen las instrucciones del medicamento slo en las cajas y no directamente en los tubos del medicamento.   Si su medicamento es muy caro, por favor, pngase en contacto con nuestra oficina llamando al [phone] y presione la opcin 4 o envenos un mensaje a travs de MyChart.   No podemos decirle cul ser su copago por los medicamentos por adelantado ya que esto es diferente dependiendo de la cobertura de su seguro.  Sin embargo, es posible que podamos encontrar un medicamento sustituto a menor costo o llenar un formulario para que el seguro cubra el medicamento que se considera necesario.   Si se requiere una autorizacin previa para que su compaa de seguros cubra su medicamento, por favor permtanos de 1 a 2 das hbiles para completar este proceso.  Los precios de los medicamentos varan con frecuencia dependiendo del lugar de dnde se surte la receta y alguna farmacias pueden ofrecer precios ms baratos.  El sitio web www.goodrx.com tiene cupones para medicamentos de diferentes farmacias. Los precios aqu no tienen en cuenta lo que podra costar con la ayuda del seguro (puede ser ms barato con su seguro), pero el sitio web puede darle el precio si no utiliz ningn seguro.  - Puede imprimir el cupn correspondiente y llevarlo con su receta a la farmacia.  - Tambin puede pasar por nuestra oficina durante el horario de atencin regular y recoger una tarjeta de cupones de GoodRx.  -   Si necesita que su receta se enve electrnicamente a una farmacia diferente, informe a nuestra oficina a travs de MyChart de [place] o por telfono llamando al [phone] y presione la opcin 4.  

## 2022-09-08 NOTE — Progress Notes (Signed)
Follow-Up Visit   Subjective  Lisa Wilkerson is a 77 y.o. female who presents for the following: Psoriasis, trunk, extremities, 28m f/u, Skyrizi sq injections q 12 wks, Clobetasol oint prn flares, AK f/u hands, ears, hx of SCC R shoulder.  Psoriasis doing well. No side effects.   The following portions of the chart were reviewed this encounter and updated as appropriate: medications, allergies, medical history  Review of Systems:  No other skin or systemic complaints except as noted in HPI or Assessment and Plan.  Objective  Well appearing patient in no apparent distress; mood and affect are within normal limits.   A focused examination was performed of the following areas: Face, ears, hands, arms, legs  Relevant exam findings are noted in the Assessment and Plan.  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 Rest of body clear <1% BSA.  Chronic condition with duration or expected duration over one year. Currently well-controlled on Skyrizi.   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  Psoriasis is a chronic non-curable, but treatable genetic/hereditary disease that may have other systemic features affecting other organ systems such as joints (Psoriatic Arthritis).  It is linked with heart disease, inflammatory bowel disease, non-alcoholic fatty liver disease, and depression. Significant skin psoriasis and/or psoriatic arthritis may have significant symptoms and affects activities of daily activity and often benefits from systemic treatments.  These systemic treatments have some potential side effects including immunosuppression and require pre-treatment laboratory screening and periodic laboratory monitoring and periodic in person evaluation and monitoring by the attending  dermatologist physician (long term medication management).    Treatment Plan: Cont Skyrizi 150mg /ml sq injections Cont Clobetasol oint qd prn flares, avoid f/g/a   Reviewed risks of biologics including immunosuppression, infections, injection site reaction, and failure to improve condition. Goal is control of skin condition, not cure.  Some older biologics such as Humira and Enbrel may slightly increase risk of malignancy and may worsen congestive heart failure.  Taltz and Cosentyx may cause inflammatory bowel disease to flare. The use of biologics requires long term medication management, including periodic office visits and monitoring of blood work.   Topical steroids (such as triamcinolone, fluocinolone, fluocinonide, mometasone, clobetasol, halobetasol, betamethasone, hydrocortisone) can cause thinning and lightening of the skin if they are used for too long in the same area. Your physician has selected the right strength medicine for your problem and area affected on the body. Please use your medication only as directed by your physician to prevent side effects.    HISTORY OF SQUAMOUS CELL CARCINOMA OF THE SKIN - No evidence of recurrence today - Recommend regular full body skin exams - Recommend daily broad spectrum sunscreen SPF 30+ to sun-exposed areas, reapply every 2 hours as needed.  - Call if any new or changing lesions are noted between office visits - L mid forearm, L lat lower leg below the knee, R shoulder  AK (actinic keratosis) (7) R hand dorsum x 1 R preauricular x 1, R zygoma x 2, R nasal dorsum x 1, L mandible x 2  Actinic keratoses are precancerous spots that appear secondary to cumulative UV radiation exposure/sun exposure over time. They are chronic with expected duration over 1 year. A portion of actinic  keratoses will progress to squamous cell carcinoma of the skin. It is not possible to reliably predict which spots will progress to skin cancer and so treatment is  recommended to prevent development of skin cancer.  Recommend daily broad spectrum sunscreen SPF 30+ to sun-exposed areas, reapply every 2 hours as needed.  Recommend staying in the shade or wearing long sleeves, sun glasses (UVA+UVB protection) and wide brim hats (4-inch brim around the entire circumference of the hat). Call for new or changing lesions.   Destruction of lesion - R hand dorsum x 1 R preauricular x 1, R zygoma x 2, R nasal dorsum x 1, L mandible x 2  Destruction method: cryotherapy   Informed consent: discussed and consent obtained   Lesion destroyed using liquid nitrogen: Yes   Region frozen until ice ball extended beyond lesion: Yes   Outcome: patient tolerated procedure well with no complications   Post-procedure details: wound care instructions given   Additional details:  Prior to procedure, discussed risks of blister formation, small wound, skin dyspigmentation, or rare scar following cryotherapy. Recommend Vaseline ointment to treated areas while healing.    HISTORY OF SQUAMOUS CELL CARCINOMA IN SITU OF THE SKIN - No evidence of recurrence today - Recommend regular full body skin exams - Recommend daily broad spectrum sunscreen SPF 30+ to sun-exposed areas, reapply every 2 hours as needed.  - Call if any new or changing lesions are noted between office visits  - L hand dorsum  ACTINIC DAMAGE - chronic, secondary to cumulative UV radiation exposure/sun exposure over time - diffuse scaly erythematous macules with underlying dyspigmentation - Recommend daily broad spectrum sunscreen SPF 30+ to sun-exposed areas, reapply every 2 hours as needed.  - Recommend staying in the shade or wearing long sleeves, sun glasses (UVA+UVB protection) and wide brim hats (4-inch brim around the entire circumference of the hat). - Call for new or changing lesions.   Milia - tiny firm white papules face - type of cyst - benign - may be extracted if symptomatic - observe  -  face  SEBORRHEIC KERATOSIS - Stuck-on, waxy, tan-brown papules and/or plaques  - Benign-appearing - Discussed benign etiology and prognosis. - Observe - Call for any changes   Return in about 6 months (around 03/10/2023) for Psoriasis f/u.  I, Ardis Rowan, RMA, am acting as scribe for Willeen Niece, MD .   Documentation: I have reviewed the above documentation for accuracy and completeness, and I agree with the above.  Willeen Niece, MD

## 2022-09-10 ENCOUNTER — Encounter (INDEPENDENT_AMBULATORY_CARE_PROVIDER_SITE_OTHER): Payer: Self-pay

## 2022-09-10 ENCOUNTER — Ambulatory Visit (INDEPENDENT_AMBULATORY_CARE_PROVIDER_SITE_OTHER): Payer: HMO | Admitting: Vascular Surgery

## 2022-09-17 ENCOUNTER — Ambulatory Visit: Payer: HMO

## 2022-09-17 ENCOUNTER — Ambulatory Visit
Admission: RE | Admit: 2022-09-17 | Discharge: 2022-09-17 | Disposition: A | Discharge status: Home / Self Care | Payer: HMO | Source: Ambulatory Visit | Attending: Acute Care | Admitting: Acute Care

## 2022-09-17 DIAGNOSIS — Z87891 Personal history of nicotine dependence: Secondary | ICD-10-CM | POA: Diagnosis present

## 2022-09-17 DIAGNOSIS — R911 Solitary pulmonary nodule: Secondary | ICD-10-CM | POA: Insufficient documentation

## 2022-09-18 ENCOUNTER — Other Ambulatory Visit (HOSPITAL_COMMUNITY): Payer: Self-pay

## 2022-09-18 MED ORDER — ISOSORBIDE MONONITRATE ER 30 MG PO TB24
30.0000 mg | ORAL_TABLET | Freq: Every day | ORAL | 1 refills | Status: DC
  Filled 2022-09-18 (×2): qty 90, 90d supply, fill #0
  Filled 2022-12-15: qty 90, 90d supply, fill #1

## 2022-09-18 MED ORDER — ATORVASTATIN CALCIUM 80 MG PO TABS
80.0000 mg | ORAL_TABLET | Freq: Every day | ORAL | 1 refills | Status: DC
  Filled 2022-10-12: qty 90, 90d supply, fill #0

## 2022-09-19 ENCOUNTER — Other Ambulatory Visit (HOSPITAL_COMMUNITY): Payer: Self-pay

## 2022-09-21 ENCOUNTER — Ambulatory Visit (INDEPENDENT_AMBULATORY_CARE_PROVIDER_SITE_OTHER): Payer: HMO | Admitting: Vascular Surgery

## 2022-09-21 ENCOUNTER — Other Ambulatory Visit: Payer: Self-pay

## 2022-09-21 ENCOUNTER — Encounter (INDEPENDENT_AMBULATORY_CARE_PROVIDER_SITE_OTHER): Payer: Self-pay | Admitting: Vascular Surgery

## 2022-09-21 VITALS — BP 197/77 | HR 73 | Resp 18 | Ht 62.0 in | Wt 109.0 lb

## 2022-09-21 DIAGNOSIS — I7143 Infrarenal abdominal aortic aneurysm, without rupture: Secondary | ICD-10-CM | POA: Diagnosis not present

## 2022-09-21 DIAGNOSIS — I10 Essential (primary) hypertension: Secondary | ICD-10-CM

## 2022-09-21 DIAGNOSIS — I6523 Occlusion and stenosis of bilateral carotid arteries: Secondary | ICD-10-CM

## 2022-09-21 DIAGNOSIS — I70213 Atherosclerosis of native arteries of extremities with intermittent claudication, bilateral legs: Secondary | ICD-10-CM | POA: Diagnosis not present

## 2022-09-21 DIAGNOSIS — E782 Mixed hyperlipidemia: Secondary | ICD-10-CM | POA: Diagnosis not present

## 2022-09-24 ENCOUNTER — Telehealth: Payer: Self-pay | Admitting: Acute Care

## 2022-09-24 NOTE — Telephone Encounter (Signed)
IMPRESSION: 1. Lung-RADS 4A, suspicious. Solid component of the left upper lobe mixed attenuation nodule is become more confluent in the interval although size is not substantially increased. Although this region was not hypermetabolic on previous PET-CT, the increased confluence of the lesion today is concerning. Follow up low-dose chest CT without contrast in 3 months (please use the following order, "CT CHEST LCS NODULE FOLLOW-UP W/O CM") is recommended. Alternatively, PET may be considered when there is a solid component 8mm or larger. 2. Numerous bilateral pulmonary nodules again identified. Nodule of concern in the left upper lobe is not substantially changed in size in the interval although the solid component of this nodule has become more confluent in the interval. 3. Abdominal aortic aneurysm measuring up to 4.1 cm diameter although the complete aneurysm is not been included in the field of view. Recommend follow-up CT/MR every 12 months and vascular consultation. 4. Aortic Atherosclerosis (ICD10-I70.0) and Emphysema (ICD10-J43.9).

## 2022-09-24 NOTE — Telephone Encounter (Signed)
Tiffany calling with call report. For CT scan.Tiffany phone number is [phone]. 

## 2022-09-26 ENCOUNTER — Encounter (INDEPENDENT_AMBULATORY_CARE_PROVIDER_SITE_OTHER): Payer: Self-pay | Admitting: Vascular Surgery

## 2022-09-26 DIAGNOSIS — I70219 Atherosclerosis of native arteries of extremities with intermittent claudication, unspecified extremity: Secondary | ICD-10-CM | POA: Insufficient documentation

## 2022-09-26 NOTE — Progress Notes (Signed)
MRN : [id number]  Lisa Wilkerson is a 77 y.o. (1945-07-09) female who presents with chief complaint of check circulation.  History of Present Illness:   The patient is seen for follow up evaluation of AAA status post CTA. There were no problems or complications related to the CT scan. The patient denies interval development of abdominal or back pain. No new lower extremity pain or discoloration of the toes.   The patient denies recent episodes of angina or shortness of breath. The patient denies interval anaurosis fugax. There is no recent history of TIA symptoms or focal motor deficits. The patient denies PAD or claudication symptoms.   CT angiography of the abdomen and pelvis shows a juxtarenal AAA that is not amenable to standard stent graft repair.  Current Meds  Medication Sig   albuterol (VENTOLIN HFA) 108 (90 Base) MCG/ACT inhaler Inhale 1-2 puffs into the lungs every 6 (six) hours as needed for wheezing or shortness of breath.   amLODipine (NORVASC) 5 MG tablet Take 1 tablet (5 mg total) by mouth daily.   aspirin 81 MG tablet Take 81 mg by mouth daily.   atorvastatin (LIPITOR) 80 MG tablet Take 1 tablet (80 mg total) by mouth daily.   Cholecalciferol 25 MCG (1000 UT) tablet Take by mouth.   clobetasol ointment (TEMOVATE) 0.05 % Apply topically as directed. Qd ti bid aa psoriasis on body until clear, then prn flares, avoid face, groin, axilla   clopidogrel (PLAVIX) 75 MG tablet Take 75 mg by mouth daily.   clotrimazole-betamethasone (LOTRISONE) cream Apply 1 application topically 2 (two) times daily as needed.    isosorbide mononitrate (IMDUR) 30 MG 24 hr tablet Take 1 tablet (30 mg total) by mouth daily.   lisinopril (ZESTRIL) 5 MG tablet Take 5 mg by mouth daily.   mirtazapine (REMERON) 15 MG tablet Take 15 mg by mouth at bedtime.   predniSONE (DELTASONE) 5 MG tablet Take 5 mg by mouth daily with breakfast.    Risankizumab-rzaa (SKYRIZI PEN) 150 MG/ML SOAJ every 3 (three) months    Past Medical History:  Diagnosis Date   Actinic keratosis 10/29/2014   L hand thenar - biopsy proven   Actinic keratosis 08/17/2017   L forearm - biopsy proven   Arthritis    Basal cell carcinoma of skin    Chronic kidney disease    Stage 3 Chronic Kidney Disease   Coronary artery disease    Hyperlipidemia    Hypertension    Myocardial infarction (HCC) 07/2009   Peripheral vascular disease (HCC)    Psoriasis    Sleep apnea    no CPAP   Squamous cell carcinoma of skin 07/13/2017   L hand dorsum SCCIS   Squamous cell carcinoma of skin 11/23/2017   L mid forearm    Squamous cell carcinoma of skin 01/07/2022   Right Shoulder - Posterior, [facility]   Squamous cell skin cancer 06/18/2014   L lat lower leg below the knee   TIA (transient ischemic attack) 2014   no deficits   Vertigo    no issues for several years  Wears dentures    full upper and lower    Past Surgical History:  Procedure Laterality Date   CARDIAC CATHETERIZATION  2011   ENDARTERECTOMY Left 11/28/2015   Procedure: ENDARTERECTOMY CAROTID;  Surgeon: Annice Needy, MD;  Location: [facility];  Service: Vascular;  Laterality: Left;   EYE SURGERY Bilateral    Cataract Extraction with IOL   MYRINGOTOMY WITH TUBE PLACEMENT Bilateral 08/19/2016   Procedure: MYRINGOTOMY WITH TUBE PLACEMENT;  Surgeon: Bud Face, MD;  Location: [facility];  Service: ENT;  Laterality: Bilateral;  sleep apnea   MYRINGOTOMY WITH TUBE PLACEMENT Bilateral 08/04/2017   Procedure: MYRINGOTOMY WITH TUBE PLACEMENT           BUTTERFLY TUBES;  Surgeon: Bud Face, MD;  Location: [facility];  Service: ENT;  Laterality: Bilateral;   MYRINGOTOMY WITH TUBE PLACEMENT Bilateral 06/24/2022   Procedure: MYRINGOTOMY WITH TUBE PLACEMENT;  Surgeon: Bud Face, MD;  Location: [facility];  Service: ENT;  Laterality: Bilateral;    UVULOPALATOPHARYNGOPLASTY  2000    Social History Social History   Tobacco Use   Smoking status: Every Day    Packs/day: 1.00    Years: 59.00    Additional pack years: 0.00    Total pack years: 59.00    Types: Cigarettes   Smokeless tobacco: Never  Vaping Use   Vaping Use: Never used  Substance Use Topics   Alcohol use: No   Drug use: No    Family History History reviewed. No pertinent family history.  Allergies  Allergen Reactions   Codeine Other (See Comments)    She states it paralyzes her and she can't move. Other reaction(s): Other (see comments) Other reaction(s): Other (See Comments) She states it paralyzes her and she can't move. "Cannot move, paralyzes me"     REVIEW OF SYSTEMS (Negative unless checked)  Constitutional: [] Weight loss  [] Fever  [] Chills Cardiac: [] Chest pain   [] Chest pressure   [] Palpitations   [] Shortness of breath when laying flat   [] Shortness of breath with exertion. Vascular:  [x] Pain in legs with walking   [] Pain in legs at rest  [] History of DVT   [] Phlebitis   [] Swelling in legs   [] Varicose veins   [] Non-healing ulcers Pulmonary:   [] Uses home oxygen   [] Productive cough   [] Hemoptysis   [] Wheeze  [] COPD   [] Asthma Neurologic:  [] Dizziness   [] Seizures   [] History of stroke   [] History of TIA  [] Aphasia   [] Vissual changes   [] Weakness or numbness in arm   [] Weakness or numbness in leg Musculoskeletal:   [] Joint swelling   [] Joint pain   [] Low back pain Hematologic:  [] Easy bruising  [] Easy bleeding   [] Hypercoagulable state   [] Anemic Gastrointestinal:  [] Diarrhea   [] Vomiting  [] Gastroesophageal reflux/heartburn   [] Difficulty swallowing. Genitourinary:  [] Chronic kidney disease   [] Difficult urination  [] Frequent urination   [] Blood in urine Skin:  [] Rashes   [] Ulcers  Psychological:  [] History of anxiety   []  History of major depression.  Physical Examination  Vitals:   09/21/22 1545  BP: (!) 197/77  Pulse: 73  Resp: 18   Weight: 109 lb (49.4 kg)  Height: 5\' 2"  (1.575 m)   Body mass index is 19.94 kg/m. Gen: WD/WN, NAD Head: [place]/AT, No temporalis wasting.  Ear/Nose/Throat: Hearing grossly intact, nares w/o erythema or drainage Eyes: PER, EOMI, sclera nonicteric.  Neck: Supple, no masses.  No bruit or JVD.  Pulmonary:  Good air movement, no audible  wheezing, no use of accessory muscles.  Cardiac: RRR, normal S1, S2, no Murmurs. Vascular:  mild trophic changes, no open wounds Vessel Right Left  Radial Palpable Palpable  PT Not Palpable Not Palpable  DP Not Palpable Not Palpable  Gastrointestinal: soft, non-distended. No guarding/no peritoneal signs.  Musculoskeletal: M/S 5/5 throughout.  No visible deformity.  Neurologic: CN 2-12 intact. Pain and light touch intact in extremities.  Symmetrical.  Speech is fluent. Motor exam as listed above. Psychiatric: Judgment intact, Mood & affect appropriate for pt's clinical situation. Dermatologic: No rashes or ulcers noted.  No changes consistent with cellulitis.   CBC Lab Results  Component Value Date   WBC 5.4 02/07/2018   HGB 12.4 02/07/2018   HCT 38.8 02/07/2018   MCV 86.8 02/07/2018   PLT 254 02/07/2018    BMET    Component Value Date/Time   NA 137 02/07/2018 0541   K 3.8 02/07/2018 0541   CL 103 02/07/2018 0541   CO2 26 02/07/2018 0541   GLUCOSE 135 (H) 02/07/2018 0541   BUN 23 02/07/2018 0541   CREATININE 2.00 (H) 08/21/2022 1453   CALCIUM 8.5 (L) 02/07/2018 0541   GFRNONAA 42 (L) 02/07/2018 0541   GFRAA 49 (L) 02/07/2018 0541   CrCl cannot be calculated (Patient's most recent lab result is older than the maximum 21 days allowed.).  COAG Lab Results  Component Value Date   INR 0.95 02/06/2018   INR 0.87 11/22/2015    Radiology CT CHEST LCS NODULE F/U LOW DOSE WO CONTRAST  Result Date: 09/24/2022 CLINICAL DATA:  77 year old female with 35 pack-year history of smoking. Lung cancer screening. EXAM: CT CHEST WITHOUT CONTRAST FOR  LUNG CANCER SCREENING NODULE FOLLOW-UP TECHNIQUE: Multidetector CT imaging of the chest was performed following the standard protocol without IV contrast. RADIATION DOSE REDUCTION: This exam was performed according to the departmental dose-optimization program which includes automated exposure control, adjustment of the mA and/or kV according to patient size and/or use of iterative reconstruction technique. COMPARISON:  12/24/2021 FINDINGS: Cardiovascular: The heart size is normal. No substantial pericardial effusion. Coronary artery calcification is evident. Moderate atherosclerotic calcification is noted in the wall of the thoracic aorta. Mediastinum/Nodes: Calcified mediastinal lymphadenopathy is stable. Index 14 mm right paratracheal lymph node measured previously is 14 mm again today on image 22/2. Calcified no tissue again noted both hilar regions. The esophagus has normal imaging features. Calcified lymph nodes in the axillary regions are not substantially changed. Lungs/Pleura: Centrilobular and paraseptal emphysema evident. Scattered areas of architectural distortion and scarring are noted bilaterally. Numerous bilateral pulmonary nodules again identified. Nodule of concern in the left upper lobe is not substantially changed in size in the interval although the solid component of this nodule has become more confluent in the interval. No focal airspace consolidation. No pleural effusion. Upper Abdomen: Abdominal aortic aneurysm again noted measuring up to 4.1 cm diameter although the complete aneurysm is not been included in the field of view. Musculoskeletal: No worrisome lytic or sclerotic osseous abnormality. IMPRESSION: 1. Lung-RADS 4A, suspicious. Solid component of the left upper lobe mixed attenuation nodule is become more confluent in the interval although size is not substantially increased. Although this region was not hypermetabolic on previous PET-CT, the increased confluence of the lesion today  is concerning. Follow up low-dose chest CT without contrast in 3 months (please use the following order, "CT CHEST LCS NODULE FOLLOW-UP W/O CM") is recommended. Alternatively, PET may be considered when there is a solid component 8mm or  larger. 2. Numerous bilateral pulmonary nodules again identified. Nodule of concern in the left upper lobe is not substantially changed in size in the interval although the solid component of this nodule has become more confluent in the interval. 3. Abdominal aortic aneurysm measuring up to 4.1 cm diameter although the complete aneurysm is not been included in the field of view. Recommend follow-up CT/MR every 12 months and vascular consultation. 4. Aortic Atherosclerosis (ICD10-I70.0) and Emphysema (ICD10-J43.9). These results will be called to the ordering clinician or representative by the Radiologist Assistant, and communication documented in the PACS or [employer]. Electronically Signed   By: Kennith Center M.D.   On: 09/24/2022 11:25     Assessment/Plan 1. Infrarenal abdominal aortic aneurysm (AAA) without rupture (HCC) Recommend:  The aneurysm is > 5 cm and therefore should undergo repair. Patient is status post CT scan of the abdominal aorta. The patient is not a candidate for standard endovascular repair.   The patient will require  evaluation for a fenestrated graft.  I have recommended [facility].  The patient will continue antiplatelet therapy as prescribed (since the patient is undergoing endovascular repair as opposed to open repair) as well as aggressive management of hyperlipidemia. Exercise is again strongly encouraged.   The patient is reminded that lifetime routine surveillance is a necessity with an endograft.   The risks and benefits of AAA repair are reviewed with the patient.  All questions are answered.  Alternative therapies are also discussed.  The patient agrees to proceed with endovascular aneurysm repair.  Patient will follow-up with me in  the office after the surgery. - Ambulatory referral to Vascular Surgery  2. Atherosclerosis of native artery of both lower extremities with intermittent claudication (HCC)  Recommend:  The patient has evidence of atherosclerosis of the lower extremities with claudication.  The patient does not voice lifestyle limiting changes at this point in time.  Noninvasive studies do not suggest clinically significant change.  No invasive studies, angiography or surgery at this time The patient should continue walking and begin a more formal exercise program.  The patient should continue antiplatelet therapy and aggressive treatment of the lipid abnormalities  No changes in the patient's medications at this time  Continued surveillance is indicated as atherosclerosis is likely to progress with time.    The patient will continue follow up with noninvasive studies as ordered.   3. Bilateral carotid artery stenosis Recommend:  Given the patient's asymptomatic subcritical stenosis no further invasive testing or surgery at this time.  Continue antiplatelet therapy as prescribed Continue management of CAD, HTN and Hyperlipidemia Healthy heart diet,  encouraged exercise at least 4 times per week Follow up in 12 months with duplex ultrasound and physical exam   4. Essential hypertension, benign Continue antihypertensive medications as already ordered, these medications have been reviewed and there are no changes at this time.  5. Mixed hyperlipidemia Continue statin as ordered and reviewed, no changes at this time    Levora Dredge, MD  09/26/2022 4:39 PM

## 2022-09-28 ENCOUNTER — Other Ambulatory Visit (HOSPITAL_COMMUNITY): Payer: Self-pay

## 2022-10-01 ENCOUNTER — Telehealth: Payer: Self-pay | Admitting: Acute Care

## 2022-10-01 ENCOUNTER — Other Ambulatory Visit: Payer: Self-pay | Admitting: Acute Care

## 2022-10-01 DIAGNOSIS — R911 Solitary pulmonary nodule: Secondary | ICD-10-CM

## 2022-10-01 NOTE — Telephone Encounter (Signed)
Dr. Karna Christmas. This patient's repeat scan was read as a LR 4A. I know she had this area PET scanned in 01/2022, and it was not hypermetabolic, but they could not rule out a slow growing adeno. . The nodule of concern has gone from 9.6 mm to 15.4 mm with an 8.1 mm solid component in 6 months per the DynaCad lung worksheet. 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 I had discussed this with Dr. Jayme Cloud, and she recommended a PET scan to better evaluate. She is scheduled to see you 7/29. Do you want me to PET this so you have results , and perhaps see her before 7/29? Please let me know. I have called the patient and explained this to her. Thanks

## 2022-10-01 NOTE — Telephone Encounter (Signed)
I have called the patient with the results of her CT Chest. Her scan was read as a LR 4A, recommendation was for a 3 month follow up. The left upper lobe nodule of concern has increased in size from 9.6 mm to 15.4 mm with a 8.1 mm solid component. I explained that I had discussed this finding with Dr. Jayme Cloud. She recommended that we repeat PET imaging. The patient is also followed by Dr. Myrtha Mantis at [facility]. I have sent both a secure chat and a message to him to see how he would like to proceed. She has follow up with him 11/02/2022.  Pt. Is aware of the above and is in agreement with the plan. D,D, and E. I am waiting to hear back from Aleskerov to see if he wants a PET scan before he sees her. I added you all  to the telephone message. Thanks so much

## 2022-10-02 NOTE — Telephone Encounter (Signed)
Per direct chat from Dr Karna Christmas ok to go ahead and order PET scan and pt will follow up with him after PET. PET order placed.

## 2022-10-02 NOTE — Telephone Encounter (Signed)
See other telephone note from 10/01/22

## 2022-10-05 ENCOUNTER — Telehealth: Payer: Self-pay | Admitting: Cardiovascular Disease

## 2022-10-05 ENCOUNTER — Other Ambulatory Visit: Payer: Self-pay

## 2022-10-05 MED ORDER — LISINOPRIL 5 MG PO TABS
5.0000 mg | ORAL_TABLET | Freq: Every day | ORAL | 3 refills | Status: DC
  Filled 2022-10-05: qty 90, 90d supply, fill #0
  Filled 2022-12-30: qty 90, 90d supply, fill #1
  Filled 2023-03-30: qty 90, 90d supply, fill #2
  Filled 2023-06-21: qty 90, 90d supply, fill #3

## 2022-10-05 MED ORDER — CLOPIDOGREL BISULFATE 75 MG PO TABS
75.0000 mg | ORAL_TABLET | Freq: Every day | ORAL | 3 refills | Status: DC
  Filled 2022-10-05: qty 90, 90d supply, fill #0
  Filled 2022-12-30: qty 90, 90d supply, fill #1
  Filled 2023-03-30: qty 90, 90d supply, fill #2
  Filled 2023-06-21: qty 90, 90d supply, fill #3

## 2022-10-05 NOTE — Telephone Encounter (Signed)
Pt c/o medication issue:  1. Name of Medication: clopidogrel (PLAVIX) 75 MG tablet ;   2. How are you currently taking this medication (dosage and times per day)? As written  3. Are you having a reaction (difficulty breathing--STAT)? No   4. What is your medication issue? Both medications need a 3 refill, 90 day prescription sent to  [facility]

## 2022-10-05 NOTE — Telephone Encounter (Signed)
Refills for Lisinopril and Plavix sent to requested pharmacy. Patient scheduled for 6 month f/u and patient states she would like to f/u sooner than one year. Patient verbalizes understanding and no further questions at this time.

## 2022-10-13 ENCOUNTER — Other Ambulatory Visit: Payer: Self-pay

## 2022-10-13 ENCOUNTER — Ambulatory Visit
Admission: RE | Admit: 2022-10-13 | Discharge: 2022-10-13 | Disposition: A | Discharge status: Home / Self Care | Payer: HMO | Source: Ambulatory Visit | Attending: Acute Care | Admitting: Acute Care

## 2022-10-13 DIAGNOSIS — I700 Atherosclerosis of aorta: Secondary | ICD-10-CM | POA: Insufficient documentation

## 2022-10-13 DIAGNOSIS — J439 Emphysema, unspecified: Secondary | ICD-10-CM | POA: Insufficient documentation

## 2022-10-13 DIAGNOSIS — R911 Solitary pulmonary nodule: Secondary | ICD-10-CM | POA: Diagnosis not present

## 2022-10-13 DIAGNOSIS — I7143 Infrarenal abdominal aortic aneurysm, without rupture: Secondary | ICD-10-CM | POA: Insufficient documentation

## 2022-10-13 LAB — GLUCOSE, CAPILLARY: Glucose-Capillary: 104 mg/dL — ABNORMAL HIGH (ref 70–99)

## 2022-10-13 MED ORDER — FLUDEOXYGLUCOSE F - 18 (FDG) INJECTION
5.6000 | Freq: Once | INTRAVENOUS | Status: AC | PRN
  Administered 2022-10-13: 6.12 via INTRAVENOUS

## 2022-10-15 DIAGNOSIS — R7303 Prediabetes: Secondary | ICD-10-CM | POA: Diagnosis not present

## 2022-10-15 DIAGNOSIS — D638 Anemia in other chronic diseases classified elsewhere: Secondary | ICD-10-CM | POA: Diagnosis not present

## 2022-10-15 DIAGNOSIS — E782 Mixed hyperlipidemia: Secondary | ICD-10-CM | POA: Diagnosis not present

## 2022-10-15 DIAGNOSIS — N184 Chronic kidney disease, stage 4 (severe): Secondary | ICD-10-CM | POA: Diagnosis not present

## 2022-10-15 DIAGNOSIS — I10 Essential (primary) hypertension: Secondary | ICD-10-CM | POA: Diagnosis not present

## 2022-10-22 DIAGNOSIS — J449 Chronic obstructive pulmonary disease, unspecified: Secondary | ICD-10-CM | POA: Diagnosis not present

## 2022-10-22 DIAGNOSIS — I10 Essential (primary) hypertension: Secondary | ICD-10-CM | POA: Diagnosis not present

## 2022-10-22 DIAGNOSIS — F17210 Nicotine dependence, cigarettes, uncomplicated: Secondary | ICD-10-CM | POA: Diagnosis not present

## 2022-10-22 DIAGNOSIS — I7143 Infrarenal abdominal aortic aneurysm, without rupture: Secondary | ICD-10-CM | POA: Diagnosis not present

## 2022-10-22 DIAGNOSIS — N184 Chronic kidney disease, stage 4 (severe): Secondary | ICD-10-CM | POA: Diagnosis not present

## 2022-10-22 DIAGNOSIS — E782 Mixed hyperlipidemia: Secondary | ICD-10-CM | POA: Diagnosis not present

## 2022-10-22 DIAGNOSIS — Z0000 Encounter for general adult medical examination without abnormal findings: Secondary | ICD-10-CM | POA: Diagnosis not present

## 2022-10-23 ENCOUNTER — Other Ambulatory Visit (HOSPITAL_COMMUNITY): Payer: Self-pay

## 2022-10-23 ENCOUNTER — Other Ambulatory Visit: Payer: Self-pay

## 2022-10-23 MED ORDER — CLOTRIMAZOLE-BETAMETHASONE 1-0.05 % EX CREA
TOPICAL_CREAM | Freq: Two times a day (BID) | CUTANEOUS | 0 refills | Status: AC
  Filled 2022-10-23: qty 45, 30d supply, fill #0

## 2022-10-28 ENCOUNTER — Encounter: Payer: Self-pay | Admitting: Cardiovascular Disease

## 2022-11-02 DIAGNOSIS — J449 Chronic obstructive pulmonary disease, unspecified: Secondary | ICD-10-CM | POA: Diagnosis not present

## 2022-11-04 DIAGNOSIS — H52223 Regular astigmatism, bilateral: Secondary | ICD-10-CM | POA: Diagnosis not present

## 2022-11-04 DIAGNOSIS — H18513 Endothelial corneal dystrophy, bilateral: Secondary | ICD-10-CM | POA: Diagnosis not present

## 2022-11-04 DIAGNOSIS — H524 Presbyopia: Secondary | ICD-10-CM | POA: Diagnosis not present

## 2022-11-04 DIAGNOSIS — H1131 Conjunctival hemorrhage, right eye: Secondary | ICD-10-CM | POA: Diagnosis not present

## 2022-11-04 DIAGNOSIS — H5203 Hypermetropia, bilateral: Secondary | ICD-10-CM | POA: Diagnosis not present

## 2022-11-04 DIAGNOSIS — H353131 Nonexudative age-related macular degeneration, bilateral, early dry stage: Secondary | ICD-10-CM | POA: Diagnosis not present

## 2022-11-04 DIAGNOSIS — Z961 Presence of intraocular lens: Secondary | ICD-10-CM | POA: Diagnosis not present

## 2022-11-12 DIAGNOSIS — I7142 Juxtarenal abdominal aortic aneurysm, without rupture: Secondary | ICD-10-CM | POA: Diagnosis not present

## 2022-11-12 DIAGNOSIS — I714 Abdominal aortic aneurysm, without rupture, unspecified: Secondary | ICD-10-CM | POA: Diagnosis not present

## 2022-11-12 DIAGNOSIS — Z8679 Personal history of other diseases of the circulatory system: Secondary | ICD-10-CM | POA: Diagnosis not present

## 2022-11-12 DIAGNOSIS — I700 Atherosclerosis of aorta: Secondary | ICD-10-CM | POA: Diagnosis not present

## 2022-11-12 DIAGNOSIS — I70201 Unspecified atherosclerosis of native arteries of extremities, right leg: Secondary | ICD-10-CM | POA: Diagnosis not present

## 2022-11-12 DIAGNOSIS — I129 Hypertensive chronic kidney disease with stage 1 through stage 4 chronic kidney disease, or unspecified chronic kidney disease: Secondary | ICD-10-CM | POA: Diagnosis not present

## 2022-11-12 DIAGNOSIS — R590 Localized enlarged lymph nodes: Secondary | ICD-10-CM | POA: Diagnosis not present

## 2022-11-12 DIAGNOSIS — F17210 Nicotine dependence, cigarettes, uncomplicated: Secondary | ICD-10-CM | POA: Diagnosis not present

## 2022-11-12 DIAGNOSIS — I7143 Infrarenal abdominal aortic aneurysm, without rupture: Secondary | ICD-10-CM | POA: Diagnosis not present

## 2022-11-12 DIAGNOSIS — R918 Other nonspecific abnormal finding of lung field: Secondary | ICD-10-CM | POA: Diagnosis not present

## 2022-11-12 DIAGNOSIS — M5135 Other intervertebral disc degeneration, thoracolumbar region: Secondary | ICD-10-CM | POA: Diagnosis not present

## 2022-11-12 DIAGNOSIS — I745 Embolism and thrombosis of iliac artery: Secondary | ICD-10-CM | POA: Diagnosis not present

## 2022-11-12 DIAGNOSIS — N1831 Chronic kidney disease, stage 3a: Secondary | ICD-10-CM | POA: Diagnosis not present

## 2022-11-12 DIAGNOSIS — Z79899 Other long term (current) drug therapy: Secondary | ICD-10-CM | POA: Diagnosis not present

## 2022-11-12 DIAGNOSIS — Z7902 Long term (current) use of antithrombotics/antiplatelets: Secondary | ICD-10-CM | POA: Diagnosis not present

## 2022-11-12 DIAGNOSIS — J449 Chronic obstructive pulmonary disease, unspecified: Secondary | ICD-10-CM | POA: Diagnosis not present

## 2022-11-12 DIAGNOSIS — N189 Chronic kidney disease, unspecified: Secondary | ICD-10-CM | POA: Diagnosis not present

## 2022-11-24 ENCOUNTER — Other Ambulatory Visit: Payer: Self-pay

## 2022-11-24 ENCOUNTER — Other Ambulatory Visit (HOSPITAL_COMMUNITY): Payer: Self-pay

## 2022-11-24 DIAGNOSIS — I10 Essential (primary) hypertension: Secondary | ICD-10-CM | POA: Diagnosis not present

## 2022-11-24 DIAGNOSIS — R809 Proteinuria, unspecified: Secondary | ICD-10-CM | POA: Diagnosis not present

## 2022-11-24 DIAGNOSIS — I129 Hypertensive chronic kidney disease with stage 1 through stage 4 chronic kidney disease, or unspecified chronic kidney disease: Secondary | ICD-10-CM | POA: Diagnosis not present

## 2022-11-24 DIAGNOSIS — N184 Chronic kidney disease, stage 4 (severe): Secondary | ICD-10-CM | POA: Diagnosis not present

## 2022-11-24 DIAGNOSIS — J449 Chronic obstructive pulmonary disease, unspecified: Secondary | ICD-10-CM | POA: Diagnosis not present

## 2022-11-24 MED ORDER — PREDNISONE 2.5 MG PO TABS
2.5000 mg | ORAL_TABLET | Freq: Every day | ORAL | 0 refills | Status: DC
  Filled 2022-11-24: qty 90, 90d supply, fill #0

## 2022-11-24 MED ORDER — MIRTAZAPINE 15 MG PO TABS
15.0000 mg | ORAL_TABLET | Freq: Every day | ORAL | 0 refills | Status: DC
  Filled 2022-11-24: qty 90, 90d supply, fill #0

## 2022-11-25 ENCOUNTER — Other Ambulatory Visit: Payer: Self-pay

## 2022-12-01 DIAGNOSIS — I129 Hypertensive chronic kidney disease with stage 1 through stage 4 chronic kidney disease, or unspecified chronic kidney disease: Secondary | ICD-10-CM | POA: Diagnosis not present

## 2022-12-01 DIAGNOSIS — N1832 Chronic kidney disease, stage 3b: Secondary | ICD-10-CM | POA: Diagnosis not present

## 2022-12-01 DIAGNOSIS — H18513 Endothelial corneal dystrophy, bilateral: Secondary | ICD-10-CM | POA: Diagnosis not present

## 2022-12-01 DIAGNOSIS — H1131 Conjunctival hemorrhage, right eye: Secondary | ICD-10-CM | POA: Diagnosis not present

## 2022-12-01 DIAGNOSIS — H04221 Epiphora due to insufficient drainage, right lacrimal gland: Secondary | ICD-10-CM | POA: Diagnosis not present

## 2022-12-01 DIAGNOSIS — H52223 Regular astigmatism, bilateral: Secondary | ICD-10-CM | POA: Diagnosis not present

## 2022-12-01 DIAGNOSIS — Z961 Presence of intraocular lens: Secondary | ICD-10-CM | POA: Diagnosis not present

## 2022-12-01 DIAGNOSIS — R809 Proteinuria, unspecified: Secondary | ICD-10-CM | POA: Diagnosis not present

## 2022-12-01 DIAGNOSIS — I10 Essential (primary) hypertension: Secondary | ICD-10-CM | POA: Diagnosis not present

## 2022-12-01 DIAGNOSIS — H353131 Nonexudative age-related macular degeneration, bilateral, early dry stage: Secondary | ICD-10-CM | POA: Diagnosis not present

## 2022-12-01 DIAGNOSIS — H524 Presbyopia: Secondary | ICD-10-CM | POA: Diagnosis not present

## 2022-12-01 DIAGNOSIS — H5203 Hypermetropia, bilateral: Secondary | ICD-10-CM | POA: Diagnosis not present

## 2022-12-01 DIAGNOSIS — N2581 Secondary hyperparathyroidism of renal origin: Secondary | ICD-10-CM | POA: Diagnosis not present

## 2022-12-02 ENCOUNTER — Other Ambulatory Visit (HOSPITAL_COMMUNITY): Payer: Self-pay

## 2022-12-02 ENCOUNTER — Other Ambulatory Visit: Payer: Self-pay

## 2022-12-02 ENCOUNTER — Encounter (HOSPITAL_COMMUNITY): Payer: Self-pay

## 2022-12-02 MED ORDER — TOBRAMYCIN-DEXAMETHASONE 0.3-0.1 % OP SUSP
1.0000 [drp] | Freq: Four times a day (QID) | OPHTHALMIC | 0 refills | Status: DC
  Filled 2022-12-02: qty 5, 25d supply, fill #0

## 2022-12-04 ENCOUNTER — Other Ambulatory Visit: Payer: Self-pay

## 2022-12-10 DIAGNOSIS — H04221 Epiphora due to insufficient drainage, right lacrimal gland: Secondary | ICD-10-CM | POA: Diagnosis not present

## 2022-12-10 DIAGNOSIS — Z961 Presence of intraocular lens: Secondary | ICD-10-CM | POA: Diagnosis not present

## 2022-12-10 DIAGNOSIS — H18513 Endothelial corneal dystrophy, bilateral: Secondary | ICD-10-CM | POA: Diagnosis not present

## 2022-12-10 DIAGNOSIS — H353131 Nonexudative age-related macular degeneration, bilateral, early dry stage: Secondary | ICD-10-CM | POA: Diagnosis not present

## 2022-12-15 ENCOUNTER — Other Ambulatory Visit (HOSPITAL_COMMUNITY): Payer: Self-pay

## 2022-12-30 ENCOUNTER — Other Ambulatory Visit: Payer: Self-pay

## 2022-12-30 ENCOUNTER — Other Ambulatory Visit (HOSPITAL_COMMUNITY): Payer: Self-pay

## 2022-12-31 ENCOUNTER — Other Ambulatory Visit (HOSPITAL_COMMUNITY): Payer: Self-pay

## 2022-12-31 ENCOUNTER — Other Ambulatory Visit: Payer: Self-pay

## 2022-12-31 MED ORDER — ATORVASTATIN CALCIUM 80 MG PO TABS
80.0000 mg | ORAL_TABLET | Freq: Every day | ORAL | 1 refills | Status: DC
  Filled 2022-12-31: qty 90, 90d supply, fill #0
  Filled 2023-04-06: qty 90, 90d supply, fill #1

## 2023-01-03 ENCOUNTER — Ambulatory Visit
Admission: RE | Admit: 2023-01-03 | Discharge: 2023-01-03 | Disposition: A | Discharge status: Home / Self Care | Payer: HMO | Source: Ambulatory Visit | Attending: Physician Assistant | Admitting: Physician Assistant

## 2023-01-03 VITALS — BP 125/62 | HR 70 | Temp 97.6°F | Resp 15 | Ht 62.0 in | Wt 106.7 lb

## 2023-01-03 DIAGNOSIS — R918 Other nonspecific abnormal finding of lung field: Secondary | ICD-10-CM

## 2023-01-03 DIAGNOSIS — D119 Benign neoplasm of major salivary gland, unspecified: Secondary | ICD-10-CM | POA: Diagnosis not present

## 2023-01-03 DIAGNOSIS — R591 Generalized enlarged lymph nodes: Secondary | ICD-10-CM

## 2023-01-03 LAB — CBC WITH DIFFERENTIAL/PLATELET
Abs Immature Granulocytes: 0.03 10*3/uL (ref 0.00–0.07)
Basophils Absolute: 0.1 10*3/uL (ref 0.0–0.1)
Basophils Relative: 1 %
Eosinophils Absolute: 0.2 10*3/uL (ref 0.0–0.5)
Eosinophils Relative: 2 %
HCT: 41 % (ref 36.0–46.0)
Hemoglobin: 13.4 g/dL (ref 12.0–15.0)
Immature Granulocytes: 0 %
Lymphocytes Relative: 23 %
Lymphs Abs: 2.3 10*3/uL (ref 0.7–4.0)
MCH: 30.4 pg (ref 26.0–34.0)
MCHC: 32.7 g/dL (ref 30.0–36.0)
MCV: 93 fL (ref 80.0–100.0)
Monocytes Absolute: 0.6 10*3/uL (ref 0.1–1.0)
Monocytes Relative: 6 %
Neutro Abs: 6.8 10*3/uL (ref 1.7–7.7)
Neutrophils Relative %: 68 %
Platelets: 223 10*3/uL (ref 150–400)
RBC: 4.41 MIL/uL (ref 3.87–5.11)
RDW: 15.1 % (ref 11.5–15.5)
WBC: 10 10*3/uL (ref 4.0–10.5)
nRBC: 0 % (ref 0.0–0.2)

## 2023-01-03 LAB — COMPREHENSIVE METABOLIC PANEL
ALT: 13 U/L (ref 0–44)
AST: 19 U/L (ref 15–41)
Albumin: 3.6 g/dL (ref 3.5–5.0)
Alkaline Phosphatase: 107 U/L (ref 38–126)
Anion gap: 10 (ref 5–15)
BUN: 30 mg/dL — ABNORMAL HIGH (ref 8–23)
CO2: 27 mmol/L (ref 22–32)
Calcium: 9.3 mg/dL (ref 8.9–10.3)
Chloride: 99 mmol/L (ref 98–111)
Creatinine, Ser: 1.5 mg/dL — ABNORMAL HIGH (ref 0.44–1.00)
GFR, Estimated: 36 mL/min — ABNORMAL LOW (ref >60)
Glucose, Bld: 111 mg/dL — ABNORMAL HIGH (ref 70–99)
Potassium: 4.3 mmol/L (ref 3.5–5.1)
Sodium: 136 mmol/L (ref 135–145)
Total Bilirubin: 0.6 mg/dL (ref 0.3–1.2)
Total Protein: 7.5 g/dL (ref 6.5–8.1)

## 2023-01-03 LAB — GROUP A STREP BY PCR: Group A Strep by PCR: NOT DETECTED

## 2023-01-03 LAB — MONONUCLEOSIS SCREEN: Mono Screen: NEGATIVE

## 2023-01-03 NOTE — Discharge Instructions (Signed)
-  Negative strep and negative mono.  Lab work is all reassuring. - As we discussed swollen lymph nodes can be a result of infection, inflammation, cancer. 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  Someone should order you imaging of your neck and follow-up further on this. - We do not have CT or ultrasound imaging in the urgent care.  If you cannot get one of your specialist or PCP to order this for you you may need to go to the emergency department. - If you ever have any weakness, fevers, stiffness of your neck, shortness of breath, chest pain, etc. please call 911 or go to the ER.

## 2023-01-03 NOTE — ED Triage Notes (Signed)
Patient reports tender swollen area on the right side of her neck that started yesterday.  Patient unsure of fevers.

## 2023-01-03 NOTE — ED Provider Notes (Signed)
[facility]    CSN: [id number] Arrival date & time: 01/03/23  1319      History   Chief Complaint Chief Complaint  Patient presents with   Sore Throat    Appointment    HPI Lisa Wilkerson is a 77 y.o. female presenting for swollen lymph nodes of right side of her neck that she woke up with yesterday.  She denies fever, fatigue, headaches, skin changes, ear pain, sinus pain, posterior neck pain, skin lesions, sore throat, cough, congestion, chest pain, shortness of breath, abdominal pain, vomiting or diarrhea.  Patient's medical history significant for basal cell carcinoma of skin, hypertension, previous MI, previous stroke, CKD stage IV, peripheral vascular disease, CAD, squamous cell carcinoma of skin, psoriasis, sleep apnea, AAA without rupture, hyperlipidemia, COPD/emphysema, lung nodule, Warthin salivary gland tumor.  Patient states she had a full body PET scan 2 months ago.  HPI  Past Medical History:  Diagnosis Date   Actinic keratosis 10/29/2014   L hand thenar - biopsy proven   Actinic keratosis 08/17/2017   L forearm - biopsy proven   Arthritis    Basal cell carcinoma of skin    Chronic kidney disease    Stage 3 Chronic Kidney Disease   Coronary artery disease    Hyperlipidemia    Hypertension    Myocardial infarction (HCC) 07/2009   Peripheral vascular disease (HCC)    Psoriasis    Sleep apnea    no CPAP   Squamous cell carcinoma of skin 07/13/2017   L hand dorsum SCCIS   Squamous cell carcinoma of skin 11/23/2017   L mid forearm    Squamous cell carcinoma of skin 01/07/2022   Right Shoulder - Posterior, [facility]   Squamous cell skin cancer 06/18/2014   L lat lower leg below the knee   TIA (transient ischemic attack) 2014   no deficits   Vertigo    no issues for several years   Wears dentures    full upper and lower    Patient Active Problem List   Diagnosis Date Noted   Atherosclerosis of native arteries of extremity with intermittent  claudication (HCC) 09/26/2022   AAA (abdominal aortic aneurysm) without rupture (HCC) 06/26/2021   Acute bronchitis 02/06/2018   Hyperlipidemia 04/03/2016   Essential hypertension, benign 04/03/2016   Carotid stenosis 11/28/2015    Past Surgical History:  Procedure Laterality Date   CARDIAC CATHETERIZATION  2011   ENDARTERECTOMY Left 11/28/2015   Procedure: ENDARTERECTOMY CAROTID;  Surgeon: Annice Needy, MD;  Location: [facility];  Service: Vascular;  Laterality: Left;   EYE SURGERY Bilateral    Cataract Extraction with IOL   MYRINGOTOMY WITH TUBE PLACEMENT Bilateral 08/19/2016   Procedure: MYRINGOTOMY WITH TUBE PLACEMENT;  Surgeon: Bud Face, MD;  Location: [facility];  Service: ENT;  Laterality: Bilateral;  sleep apnea   MYRINGOTOMY WITH TUBE PLACEMENT Bilateral 08/04/2017   Procedure: MYRINGOTOMY WITH TUBE PLACEMENT           BUTTERFLY TUBES;  Surgeon: Bud Face, MD;  Location: [facility];  Service: ENT;  Laterality: Bilateral;   MYRINGOTOMY WITH TUBE PLACEMENT Bilateral 06/24/2022   Procedure: MYRINGOTOMY WITH TUBE PLACEMENT;  Surgeon: Bud Face, MD;  Location: [facility];  Service: ENT;  Laterality: Bilateral;   UVULOPALATOPHARYNGOPLASTY  2000    OB History   No obstetric history on file.      Home Medications    Prior to Admission medications   Medication Sig Start Date  End Date Taking? Authorizing Provider  albuterol (VENTOLIN HFA) 108 (90 Base) MCG/ACT inhaler Inhale 1-2 puffs into the lungs every 6 (six) hours as needed for wheezing or shortness of breath. 02/16/22   Antonieta Iba, MD  amLODipine (NORVASC) 5 MG tablet Take 1 tablet (5 mg total) by mouth daily. 02/16/22   Antonieta Iba, MD  aspirin 81 MG tablet Take 81 mg by mouth daily.    [provider]  atorvastatin (LIPITOR) 80 MG tablet Take 1 tablet (80 mg total) by mouth daily. 12/31/22     Cholecalciferol 25 MCG (1000 UT) tablet Take by mouth.     [provider]  clobetasol ointment (TEMOVATE) 0.05 % Apply topically as directed. Qd ti bid aa psoriasis on body until clear, then prn flares, avoid face, groin, axilla 08/17/19   Willeen Niece, MD  clopidogrel (PLAVIX) 75 MG tablet Take 1 tablet (75 mg total) by mouth daily. 10/05/22   Debbe Odea, MD  clotrimazole-betamethasone (LOTRISONE) cream Apply 1 application topically 2 (two) times daily as needed.     [provider]  clotrimazole-betamethasone (LOTRISONE) cream Apply topically 2 (two) times daily 10/22/22     isosorbide mononitrate (IMDUR) 30 MG 24 hr tablet Take 1 tablet (30 mg total) by mouth daily. 01/06/22     lisinopril (ZESTRIL) 5 MG tablet Take 1 tablet (5 mg total) by mouth daily. 10/05/22   Debbe Odea, MD  mirtazapine (REMERON) 15 MG tablet Take 15 mg by mouth at bedtime. 07/14/22 10/12/22  [provider]  mirtazapine (REMERON) 15 MG tablet Take 1 tablet (15 mg total) by mouth at bedtime 11/24/22     predniSONE (DELTASONE) 2.5 MG tablet Take 1 tablet (2.5 mg total) by mouth daily. 11/24/22     Risankizumab-rzaa (SKYRIZI PEN) 150 MG/ML SOAJ every 3 (three) months 05/01/21   [provider]  tobramycin-dexamethasone [facility]) ophthalmic solution Place 1 drop into the right eye 4 (four) times daily. 12/01/22       Family History History reviewed. No pertinent family history.  Social History Social History   Tobacco Use   Smoking status: Every Day    Current packs/day: 1.00    Average packs/day: 1 pack/day for 59.0 years (59.0 ttl pk-yrs)    Types: Cigarettes   Smokeless tobacco: Never  Vaping Use   Vaping status: Never Used  Substance Use Topics   Alcohol use: No   Drug use: No     Allergies   Codeine   Review of Systems Review of Systems  Constitutional:  Negative for chills, diaphoresis, fatigue and fever.  HENT:  Positive for sore throat. Negative for congestion, ear discharge, ear pain, rhinorrhea, sinus pressure  and sinus pain.   Respiratory:  Negative for cough and shortness of breath.   Cardiovascular:  Negative for chest pain.  Gastrointestinal:  Negative for abdominal pain, nausea and vomiting.  Musculoskeletal:  Negative for arthralgias and myalgias.  Skin:  Negative for rash.  Neurological:  Negative for dizziness, weakness and headaches.  Hematological:  Positive for adenopathy.     Physical Exam Triage Vital Signs ED Triage Vitals  Encounter Vitals Group     BP      Systolic BP Percentile      Diastolic BP Percentile      Pulse      Resp      Temp      Temp src      SpO2      Weight  Height      Head Circumference      Peak Flow      Pain Score      Pain Loc      Pain Education      Exclude from Growth Chart    No data found.  Updated Vital Signs BP 125/62 (BP Location: Right Arm)   Pulse 70   Temp 97.6 F (36.4 C) (Oral)   Resp 15   Ht 5\' 2"  (1.575 m)   Wt 106 lb 11.2 oz (48.4 kg)   SpO2 97%   BMI 19.52 kg/m   Physical Exam Vitals and nursing note reviewed.  Constitutional:      General: She is not in acute distress.    Appearance: Normal appearance. She is not ill-appearing or toxic-appearing.  HENT:     Head: Normocephalic and atraumatic.     Nose: Nose normal.     Mouth/Throat:     Mouth: Mucous membranes are moist.     Pharynx: Oropharynx is clear. Posterior oropharyngeal erythema present.  Eyes:     General: No scleral icterus.       Right eye: No discharge.        Left eye: No discharge.     Conjunctiva/sclera: Conjunctivae normal.  Cardiovascular:     Rate and Rhythm: Normal rate and regular rhythm.     Heart sounds: Normal heart sounds.  Pulmonary:     Effort: Pulmonary effort is normal. No respiratory distress.     Breath sounds: Normal breath sounds.  Musculoskeletal:     Cervical back: Neck supple.  Lymphadenopathy:     Cervical: Cervical adenopathy (swelling of bilateral anterior and posterior cervical lymph node chains.)  present.  Skin:    General: Skin is dry.  Neurological:     General: No focal deficit present.     Mental Status: She is alert. Mental status is at baseline.     Motor: No weakness.     Gait: Gait normal.  Psychiatric:        Mood and Affect: Mood normal.        Behavior: Behavior normal.        Thought Content: Thought content normal.      UC Treatments / Results  Labs (all labs ordered are listed, but only abnormal results are displayed) Labs Reviewed  COMPREHENSIVE METABOLIC PANEL - Abnormal; Notable for the following components:      Result Value   Glucose, Bld 111 (*)    BUN 30 (*)    Creatinine, Ser 1.50 (*)    GFR, Estimated 36 (*)    All other components within normal limits  GROUP A STREP BY PCR  CBC WITH DIFFERENTIAL/PLATELET  MONONUCLEOSIS SCREEN    EKG   Radiology No results found.  Narrative & Impression  CLINICAL DATA:  Initial treatment strategy for left upper lobe pulmonary nodule.   EXAM: NUCLEAR MEDICINE PET SKULL BASE TO THIGH   TECHNIQUE: 6.1 mCi F-18 FDG was injected intravenously. Full-ring PET imaging was performed from the skull base to thigh after the radiotracer. CT data was obtained and used for attenuation correction and anatomic localization.   Fasting blood glucose: 104 mg/dl   COMPARISON:  CT chest dated 09/17/2022. CT abdomen/pelvis dated 08/21/2022.   FINDINGS: Mediastinal blood pool activity: SUV max 2.2   Liver activity: SUV max NA   NECK: No hypermetabolic cervical lymphadenopathy.   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   Mild hypermetabolism along the right vocal fold, without lesion on CT, likely physiologic.   Incidental CT findings: None.   CHEST: 16 mm subsolid nodule in the left upper lobe with 8 mm solid component (series 4/image 47). Only minimal hypermetabolism, max SUV 1.1. However, this appearance remains suspicious for semi  invasive adenocarcinoma.   Calcified mediastinal and bilateral hilar lymph nodes, including a 17 mm short axis low right paratracheal node (series 4/image 67) and a 14 mm short axis left axillary node (series 4/image 43), max SUV 2.1. This appearance is favored to be reactive, suggesting granulomatous disease.   Incidental CT findings: Moderate centrilobular and paraseptal emphysematous changes, upper lung predominant. Mild subpleural scarring in the inferior right middle and lower lobes. Severe atherosclerotic calcifications of the aortic arch. Severe three-vessel coronary atherosclerosis.   ABDOMEN/PELVIS: No abnormal metabolism in the liver, spleen, pancreas, or adrenal glands.   No hypermetabolic abdominopelvic lymphadenopathy. Calcified bilateral inguinal nodes, likely reactive.   Incidental CT findings: 4.2 cm infrarenal abdominal aortic aneurysm. Atherosclerotic calcifications abdominal aorta and branch vessels. Bilateral simple renal cysts, measuring up to 2.1 cm in the left lateral interpolar region and a 13 mm hemorrhagic cyst in lateral left lower kidney (series 4/image 93). Mild layering gallbladder sludge versus noncalcified gallstones (series 4/image 96).   SKELETON: No focal hypermetabolic activity to suggest skeletal metastasis.   Incidental CT findings: Degenerative changes of the visualized thoracolumbar spine.   IMPRESSION: 16 mm subsolid nodule in the left upper lobe with 8 mm solid component, with only minimal hypermetabolism, but suspicious for semi invasive adenocarcinoma.   No findings suspicious for metastatic disease.   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   4.2 cm infrarenal abdominal aortic aneurysm. Recommend follow-up ultrasound every 12 months and vascular consultation.   Aortic Atherosclerosis (ICD10-I70.0) and Emphysema (ICD10-J43.9).     Electronically Signed   By: Charline Bills M.D.   On: 10/19/2022 03:13      Procedures Procedures (including critical care time)  Medications Ordered in UC Medications - No data to display  Initial Impression / Assessment and Plan / UC Course  I have reviewed the triage vital signs and the nursing notes.  Pertinent labs & imaging results that were available during my care of the patient were reviewed by me and considered in my medical decision making (see chart for details).   77 year old female with history of skin cancer, Warthin salivary gland tumor, suspicious lesion in the lung (PET SCAN--possible invasive adenocarcinoma), COPD, HTN, CKD stage IV, previous MI, previous stroke.  Patient presenting today for lymphadenopathy of bilateral anterior and posterior cervical lymph nodes.  Vitals are all normal and stable.  Exam is significant for tender enlarged anterior cervical lymph nodes and posterior lymph nodes.  There is a low right anterior cervical lymph node which I do not believe is a supraclavicular lymph node.  No obvious axillary lymphadenopathy.  Normal HEENT exam otherwise.  Chest clear to auscultation and heart regular rate and rhythm.  Strep, mono, CBC, CMP obtained.  CBC is normal.  Mono negative.  Strep negative.  CMP shows creatinine 1.5 the patient has history of known CKD stage IV.  No major changes to her lab work which is overall reassuring.  Vital signs also reassuring.  See results of PET scan above performed 2 months ago which show 16mm nodule in the left upper lobe with 8 mm solid component which is suspicious  for invasive adenocarcinoma.  Explained to patient that I am unsure as to the cause of her diffuse lymphadenopathy of neck but I do have concerns about it being related to underlying malignancy given her history.  Advised her to contact her pulmonologist, ENT specialist and PCP first thing tomorrow to make appointments.  Explained that she needs to have some imaging performed.  She does not want  to go to the emergency department tonight and have more workup.  Advised that if any symptoms do worse,n and I did review ED precautions with her, then she should go to the emergency department.  Undiagnosed new problem with uncertain prognosis.  Concern for malignancy possibly with lymph node spread.   Final Clinical Impressions(s) / UC Diagnoses   Final diagnoses:  Lymphadenopathy of head and neck     Discharge Instructions      -Negative strep and negative mono.  Lab work is all reassuring. - As we discussed swollen lymph nodes can be a result of infection, inflammation, cancer. 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  Someone should order you imaging of your neck and follow-up further on this. - We do not have CT or ultrasound imaging in the urgent care.  If you cannot get one of your specialist or PCP to order this for you you may need to go to the emergency department. - If you ever have any weakness, fevers, stiffness of your neck, shortness of breath, chest pain, etc. please call 911 or go to the ER.     ED Prescriptions   None    PDMP not reviewed this encounter.   Shirlee Latch, PA-C 01/03/23 1506

## 2023-01-05 DIAGNOSIS — R590 Localized enlarged lymph nodes: Secondary | ICD-10-CM | POA: Diagnosis not present

## 2023-01-05 DIAGNOSIS — I10 Essential (primary) hypertension: Secondary | ICD-10-CM | POA: Diagnosis not present

## 2023-01-05 DIAGNOSIS — J449 Chronic obstructive pulmonary disease, unspecified: Secondary | ICD-10-CM | POA: Diagnosis not present

## 2023-01-05 DIAGNOSIS — N184 Chronic kidney disease, stage 4 (severe): Secondary | ICD-10-CM | POA: Diagnosis not present

## 2023-01-15 DIAGNOSIS — H903 Sensorineural hearing loss, bilateral: Secondary | ICD-10-CM | POA: Diagnosis not present

## 2023-01-15 DIAGNOSIS — R591 Generalized enlarged lymph nodes: Secondary | ICD-10-CM | POA: Diagnosis not present

## 2023-01-15 DIAGNOSIS — D110 Benign neoplasm of parotid gland: Secondary | ICD-10-CM | POA: Diagnosis not present

## 2023-01-15 DIAGNOSIS — H6983 Other specified disorders of Eustachian tube, bilateral: Secondary | ICD-10-CM | POA: Diagnosis not present

## 2023-02-02 DIAGNOSIS — R911 Solitary pulmonary nodule: Secondary | ICD-10-CM | POA: Diagnosis not present

## 2023-02-10 ENCOUNTER — Ambulatory Visit
Admission: RE | Admit: 2023-02-10 | Discharge: 2023-02-10 | Disposition: A | Discharge status: Home / Self Care | Payer: HMO | Source: Ambulatory Visit | Attending: Radiation Oncology | Admitting: Radiation Oncology

## 2023-02-10 ENCOUNTER — Encounter: Payer: Self-pay | Admitting: Radiation Oncology

## 2023-02-10 VITALS — BP 134/70 | HR 76 | Temp 98.2°F | Resp 20 | Wt 108.0 lb

## 2023-02-10 DIAGNOSIS — L570 Actinic keratosis: Secondary | ICD-10-CM | POA: Insufficient documentation

## 2023-02-10 DIAGNOSIS — F17210 Nicotine dependence, cigarettes, uncomplicated: Secondary | ICD-10-CM | POA: Diagnosis not present

## 2023-02-10 DIAGNOSIS — I2510 Atherosclerotic heart disease of native coronary artery without angina pectoris: Secondary | ICD-10-CM | POA: Diagnosis not present

## 2023-02-10 DIAGNOSIS — I252 Old myocardial infarction: Secondary | ICD-10-CM | POA: Diagnosis not present

## 2023-02-10 DIAGNOSIS — Z7952 Long term (current) use of systemic steroids: Secondary | ICD-10-CM | POA: Insufficient documentation

## 2023-02-10 DIAGNOSIS — N189 Chronic kidney disease, unspecified: Secondary | ICD-10-CM | POA: Insufficient documentation

## 2023-02-10 DIAGNOSIS — R911 Solitary pulmonary nodule: Secondary | ICD-10-CM | POA: Diagnosis not present

## 2023-02-10 DIAGNOSIS — Z85828 Personal history of other malignant neoplasm of skin: Secondary | ICD-10-CM | POA: Diagnosis not present

## 2023-02-10 DIAGNOSIS — Z7902 Long term (current) use of antithrombotics/antiplatelets: Secondary | ICD-10-CM | POA: Diagnosis not present

## 2023-02-10 DIAGNOSIS — E785 Hyperlipidemia, unspecified: Secondary | ICD-10-CM | POA: Diagnosis not present

## 2023-02-10 DIAGNOSIS — Z7982 Long term (current) use of aspirin: Secondary | ICD-10-CM | POA: Insufficient documentation

## 2023-02-10 DIAGNOSIS — J449 Chronic obstructive pulmonary disease, unspecified: Secondary | ICD-10-CM | POA: Insufficient documentation

## 2023-02-10 DIAGNOSIS — Z79899 Other long term (current) drug therapy: Secondary | ICD-10-CM | POA: Insufficient documentation

## 2023-02-10 DIAGNOSIS — I10 Essential (primary) hypertension: Secondary | ICD-10-CM | POA: Insufficient documentation

## 2023-02-10 NOTE — Consult Note (Signed)
NEW PATIENT EVALUATION  Name: Lisa Wilkerson  MRN: [id number]  Date:   02/10/2023     DOB: [birth year]   This 77 y.o. female patient presents to the clinic for initial evaluation of left upper lobe nodule which is progressing over time and is slightly hypermetabolic consistent with a primary non-small cell lung cancer.  REFERRING PHYSICIAN: Marisue Ivan, MD  CHIEF COMPLAINT:  Chief Complaint  Patient presents with   Solitary pulmonar nodule    DIAGNOSIS: The encounter diagnosis was Solitary pulmonary nodule.   PREVIOUS INVESTIGATIONS:  Serial CT scans and PET scans reviewed Labs reviewed Clinical notes reviewed  HPI: Patient is a 77 year old female followed by the [facility] pulmonary clinic for dyspnea on exertion advanced COPD.  She has not a panic episode every night.  She is a lifetime smoker and has significant dyspnea on exertion.  She is being followed for a progressive left upper lobe lung nodule.  Back in 9 of 23 it was noted to have a lung RADS 4A suspicious 9.6 mm left upper lobe pulmonary nodule.  She does have calcified lymphadenopathy in her mediastinum consistent with sarcoid or granulomatous disease.  She also had a PET CT scan back in 1023 showing the previously demonstrated part solid left upper lobe pulm nodule not hypermetabolic.  Low-grade adenocarcinoma is not completely excluded.  She had hypermetabolic activity in the left parotid region which was biopsied and consistent with a Warthin's tumor.  CT scan back in June showed more confluence of the lesion concerning for malignancy.  PET scan CT was also performed showing now a 1.6 cm subsolid nodule with 8 mm of solid component with only minimal hypermetabolic activity although suspicious for semiinvasive adenocarcinoma no other findings suspicious for metastatic disease.  Based on her poor pulmonary functions she is not a candidate for biopsy.  She specifically [place] hemoptysis.  Does have a slight nonproductive cough.   Seen today for consideration of treatment.  PLANNED TREATMENT REGIMEN: Follow-up in 3 to 4 months with CT scan and possible SBRT  PAST MEDICAL HISTORY:  has a past medical history of Actinic keratosis (10/29/2014), Actinic keratosis (08/17/2017), Arthritis, Basal cell carcinoma of skin, Chronic kidney disease, Coronary artery disease, Hyperlipidemia, Hypertension, Myocardial infarction (HCC) (07/2009), Peripheral vascular disease (HCC), Psoriasis, Sleep apnea, Squamous cell carcinoma of skin (07/13/2017), Squamous cell carcinoma of skin (11/23/2017), Squamous cell carcinoma of skin (01/07/2022), Squamous cell skin cancer (06/18/2014), TIA (transient ischemic attack) (2014), Vertigo, and Wears dentures.    PAST SURGICAL HISTORY:  Past Surgical History:  Procedure Laterality Date   CARDIAC CATHETERIZATION  2011   ENDARTERECTOMY Left 11/28/2015   Procedure: ENDARTERECTOMY CAROTID;  Surgeon: Annice Needy, MD;  Location: [facility] ORS;  Service: Vascular;  Laterality: Left;   EYE SURGERY Bilateral    Cataract Extraction with IOL   MYRINGOTOMY WITH TUBE PLACEMENT Bilateral 08/19/2016   Procedure: MYRINGOTOMY WITH TUBE PLACEMENT;  Surgeon: Bud Face, MD;  Location: [facility];  Service: ENT;  Laterality: Bilateral;  sleep apnea   MYRINGOTOMY WITH TUBE PLACEMENT Bilateral 08/04/2017   Procedure: MYRINGOTOMY WITH TUBE PLACEMENT           BUTTERFLY TUBES;  Surgeon: Bud Face, MD;  Location: [facility];  Service: ENT;  Laterality: Bilateral;   MYRINGOTOMY WITH TUBE PLACEMENT Bilateral 06/24/2022   Procedure: MYRINGOTOMY WITH TUBE PLACEMENT;  Surgeon: Bud Face, MD;  Location: [facility];  Service: ENT;  Laterality: Bilateral;   UVULOPALATOPHARYNGOPLASTY  2000    FAMILY HISTORY:  family history is not on file.  SOCIAL HISTORY:  reports that she has been smoking cigarettes. She has a 59 pack-year smoking history. She has never used smokeless tobacco. She reports  that she does not drink alcohol and does not use drugs.  ALLERGIES: Codeine  MEDICATIONS:  Current Outpatient Medications  Medication Sig Dispense Refill   albuterol (VENTOLIN HFA) 108 (90 Base) MCG/ACT inhaler Inhale 1-2 puffs into the lungs every 6 (six) hours as needed for wheezing or shortness of breath. 6.7 g 3   amLODipine (NORVASC) 5 MG tablet Take 1 tablet (5 mg total) by mouth daily. 90 tablet 3   aspirin 81 MG tablet Take 81 mg by mouth daily.     atorvastatin (LIPITOR) 80 MG tablet Take 1 tablet (80 mg total) by mouth daily. 90 tablet 1   Cholecalciferol 25 MCG (1000 UT) tablet Take by mouth.     clobetasol ointment (TEMOVATE) 0.05 % Apply topically as directed. Qd ti bid aa psoriasis on body until clear, then prn flares, avoid face, groin, axilla 60 g 1   clopidogrel (PLAVIX) 75 MG tablet Take 1 tablet (75 mg total) by mouth daily. 90 tablet 3   clotrimazole-betamethasone (LOTRISONE) cream Apply topically 2 (two) times daily 45 g 0   isosorbide mononitrate (IMDUR) 30 MG 24 hr tablet Take 1 tablet (30 mg total) by mouth daily. 90 tablet 1   lisinopril (ZESTRIL) 5 MG tablet Take 1 tablet (5 mg total) by mouth daily. 90 tablet 3   mirtazapine (REMERON) 15 MG tablet Take 1 tablet (15 mg total) by mouth at bedtime 90 tablet 0   predniSONE (DELTASONE) 2.5 MG tablet Take 1 tablet (2.5 mg total) by mouth daily. 90 tablet 0   Risankizumab-rzaa (SKYRIZI PEN) 150 MG/ML SOAJ every 3 (three) months     No current facility-administered medications for this encounter.    ECOG PERFORMANCE STATUS:  0 - Asymptomatic  REVIEW OF SYSTEMS: Patient has a history of chronic renal disease stage III coronary artery disease hyperlipidemia hypertension history of myocardial infarction in 2011 peripheral vascular disease TIA Patient denies any weight loss, fatigue, weakness, fever, chills or night sweats. Patient denies any loss of vision, blurred vision. Patient denies any ringing  of the ears or hearing  loss. No irregular heartbeat. Patient denies heart murmur or history of fainting. Patient denies any chest pain or pain radiating to her upper extremities. Patient denies any shortness of breath, difficulty breathing at night, cough or hemoptysis. Patient denies any swelling in the lower legs. Patient denies any nausea vomiting, vomiting of blood, or coffee ground material in the vomitus. Patient denies any stomach pain. Patient states has had normal bowel movements no significant constipation or diarrhea. Patient denies any dysuria, hematuria or significant nocturia. Patient denies any problems walking, swelling in the joints or loss of balance. Patient denies any skin changes, loss of hair or loss of weight. Patient denies any excessive worrying or anxiety or significant depression. Patient denies any problems with insomnia. Patient denies excessive thirst, polyuria, polydipsia. Patient denies any swollen glands, patient denies easy bruising or easy bleeding. Patient denies any recent infections, allergies or URI. Patient "s visual fields have not changed significantly in recent time.   PHYSICAL EXAM: BP 134/70   Pulse 76   Temp 98.2 F (36.8 C) (Tympanic)   Resp 20   Wt 108 lb (49 kg)   BMI 19.75 kg/m  Frail-appearing female in NAD.  Well-developed well-nourished patient in NAD. HEENT reveals  PERLA, EOMI, discs not visualized.  Oral cavity is clear. No oral mucosal lesions are identified. Neck is clear without evidence of cervical or supraclavicular adenopathy. Lungs are clear to A&P. Cardiac examination is essentially unremarkable with regular rate and rhythm without murmur rub or thrill. Abdomen is benign with no organomegaly or masses noted. Motor sensory and DTR levels are equal and symmetric in the upper and lower extremities. Cranial nerves II through XII are grossly intact. Proprioception is intact. No peripheral adenopathy or edema is identified. No motor or sensory levels are noted. Crude  visual fields are within normal range.  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  I would like to wait another 3 to 4 months and repeat a CT scan should progress at that time as it is been over the past several years I would offer SBRT treatment in 5 fractions.  Patient's son was present at this time they both comprehend my recommendations well.  I have scheduled him for a follow-up in a CT scan in 3 to 4 months.  I would like to take this opportunity to thank you for allowing me to participate in the care of your patient.Carmina Miller, MD

## 2023-03-09 ENCOUNTER — Ambulatory Visit: Payer: HMO | Admitting: Dermatology

## 2023-03-09 DIAGNOSIS — Z5181 Encounter for therapeutic drug level monitoring: Secondary | ICD-10-CM

## 2023-03-09 DIAGNOSIS — L409 Psoriasis, unspecified: Secondary | ICD-10-CM

## 2023-03-09 DIAGNOSIS — Z79899 Other long term (current) drug therapy: Secondary | ICD-10-CM | POA: Diagnosis not present

## 2023-03-09 DIAGNOSIS — Z7189 Other specified counseling: Secondary | ICD-10-CM | POA: Diagnosis not present

## 2023-03-09 DIAGNOSIS — Z85828 Personal history of other malignant neoplasm of skin: Secondary | ICD-10-CM | POA: Diagnosis not present

## 2023-03-09 DIAGNOSIS — L821 Other seborrheic keratosis: Secondary | ICD-10-CM

## 2023-03-09 NOTE — Progress Notes (Signed)
quanti  Follow-Up Visit   Subjective  Lisa Wilkerson is a 77 y.o. female who presents for the following: Psoriasis trunk, extremities, 43m f/u, Skyrizi sq injections q 12 wks, Clobetasol oint prn flares. Psoriasis doing well, no recent flares. No side effects, no infections. She has a couple of spots on her face to check.    The following portions of the chart were reviewed this encounter and updated as appropriate: medications, allergies, medical history  Review of Systems:  No other skin or systemic complaints except as noted in HPI or Assessment and Plan.  Objective  Well appearing patient in no apparent distress; mood and affect are within normal limits.  Areas Examined: Face, trunk, extremities  Relevant exam findings are noted in the Assessment and Plan.      Assessment & Plan   Psoriasis  Related Procedures QuantiFERON-TB Gold Plus    PSORIASIS on systemic treatment Exam: Skin clear today, <1% BSA.  Chronic condition with duration or expected duration over one year. Currently well-controlled on Skyrizi.   Counseling and coordination of care for severe psoriasis on systemic treatment  Psoriasis - severe on systemic treatment.  Psoriasis is a chronic non-curable, but treatable genetic/hereditary disease that may have other systemic features affecting other organ systems such as joints (Psoriatic Arthritis).  It is linked with heart disease, inflammatory bowel disease, non-alcoholic fatty liver disease, and depression. Significant skin psoriasis and/or psoriatic arthritis may have significant symptoms and affects activities of daily activity and often benefits from systemic treatments.  These systemic treatments have some potential side effects including immunosuppression and require pre-treatment laboratory screening and periodic laboratory monitoring and periodic in person evaluation and monitoring by the attending dermatologist physician (long term medication  management).   Patient denies joint pain  Treatment Plan: CMP and CBC reviewed from 01/03/2023.  QuantiFERON-TB Gold lab ordered.   Cont Skyrizi 150mg /ml sq injections q 3 mos Cont Clobetasol oint qd prn flares, avoid f/g/a    Reviewed risks of biologics including immunosuppression, infections, injection site reaction, and failure to improve condition. Goal is control of skin condition, not cure.  Some older biologics such as Humira and Enbrel may slightly increase risk of malignancy and may worsen congestive heart failure.  Taltz and Cosentyx may cause inflammatory bowel disease to flare. The use of biologics requires long term medication management, including periodic office visits and monitoring of blood work.    Topical steroids (such as triamcinolone, fluocinolone, fluocinonide, mometasone, clobetasol, halobetasol, betamethasone, hydrocortisone) can cause thinning and lightening of the skin if they are used for too long in the same area. Your physician has selected the right strength medicine for your problem and area affected on the body. Please use your medication only as directed by your physician to prevent side effects.     Long term medication management.  Patient is using long term (months to years) prescription medication  to control their dermatologic condition.  These medications require periodic monitoring to evaluate for efficacy and side effects and may require periodic laboratory monitoring.   SEBORRHEIC KERATOSIS - Stuck-on, waxy, tan-brown papules and/or plaques, including face  - Benign-appearing - Discussed benign etiology and prognosis. - Observe  Discussed cryotherapy if spot(s) become irritated or inflamed.  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 Multiple, see History. - No evidence of recurrence today - Recommend regular full body skin  exams -  Recommend daily broad spectrum sunscreen SPF 30+ to sun-exposed areas, reapply every 2 hours as needed.  - Call if any new or changing lesions are noted between office visits   Return in about 6 months (around 09/07/2023) for Psoriasis, Hx SCC.  ICherlyn Labella, CMA, am acting as scribe for Willeen Niece, MD .   Documentation: I have reviewed the above documentation for accuracy and completeness, and I agree with the above.  Willeen Niece, MD

## 2023-03-09 NOTE — Patient Instructions (Signed)

## 2023-03-14 NOTE — Progress Notes (Unsigned)
Cardiology Office Note  Date:  03/15/2023   ID:  Lisa Wilkerson, DOB Oct 04, 1945, MRN [id number]  PCP:  Lisa Ivan, MD   Chief Complaint  Patient presents with   6 month follow up     Pt. Recently diagnosed with adenopathy in the chest with nodule in the left upper lobe. She is followed by pulmonology. Pt c/o leg weakness, SOB, no energy and chest tightness/indigestion; mostly after eating. Medications reviewed verbally with patient.     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 Rushie Chestnut Repeat pet scan scheduled, results will dictate whether she needs radiation treatment  Reports having periodic indigestion when eating Forces a burp and symptoms resolve Sometimes uses carbonated beverages for relief  Followed by vascular for AAA Not a candidate for endovascular repair Followed at [facility], aneurysm measured at 4.2 cm in diameter on CT scan May 2024   cardiac catheterization 2011, nonobstructive disease by her report Report not in the system  EKG personally reviewed by myself on todays visit EKG Interpretation Date/Time:  Monday March 15 2023 11:28:17 EST Ventricular Rate:  69 PR Interval:  176 QRS Duration:  86 QT Interval:  404 QTC Calculation: 432 R Axis:   35  Text Interpretation: Normal sinus rhythm Minimal voltage criteria for LVH, may be normal variant ( Cornell product ) When compared with ECG of 07-Feb-2018 02:45, T wave amplitude has  decreased in Inferior leads Confirmed by Julien Nordmann [phone]) on 03/15/2023 11:39:59 AM    PMH:   has a past medical history of Actinic keratosis (10/29/2014), Actinic keratosis (08/17/2017), Arthritis, Basal cell carcinoma of skin, Chronic kidney disease, Coronary artery disease, Hyperlipidemia, Hypertension, Myocardial infarction (HCC) (07/2009), Peripheral vascular disease (HCC), Psoriasis, Sleep apnea, Squamous cell carcinoma of skin (07/13/2017), Squamous cell carcinoma of skin (11/23/2017), Squamous cell carcinoma of skin (01/07/2022), Squamous cell skin cancer (06/18/2014), TIA (transient ischemic attack) (2014), Vertigo, and Wears dentures.  PSH:    Past Surgical History:  Procedure Laterality Date   CARDIAC CATHETERIZATION  2011   ENDARTERECTOMY Left 11/28/2015   Procedure: ENDARTERECTOMY CAROTID;  Surgeon: Annice Needy, MD;  Location: [facility];  Service: Vascular;  Laterality: Left;   EYE SURGERY Bilateral    Cataract Extraction with IOL   MYRINGOTOMY WITH TUBE PLACEMENT Bilateral 08/19/2016   Procedure: MYRINGOTOMY WITH TUBE PLACEMENT;  Surgeon: Bud Face, MD;  Location: [facility];  Service: ENT;  Laterality: Bilateral;  sleep apnea   MYRINGOTOMY WITH TUBE PLACEMENT Bilateral 08/04/2017   Procedure: MYRINGOTOMY WITH TUBE PLACEMENT           BUTTERFLY TUBES;  Surgeon: Bud Face, MD;  Location: [facility];  Service: ENT;  Laterality: Bilateral;   MYRINGOTOMY WITH TUBE PLACEMENT Bilateral 06/24/2022   Procedure: MYRINGOTOMY WITH TUBE PLACEMENT;  Surgeon: Bud Face, MD;  Location: [facility];  Service: ENT;  Laterality: Bilateral;   UVULOPALATOPHARYNGOPLASTY  2000    Current Outpatient Medications  Medication Sig Dispense Refill   albuterol (VENTOLIN HFA) 108 (90  Base) MCG/ACT inhaler Inhale 1-2 puffs into the lungs every 6 (six) hours as needed for wheezing or shortness of breath. 6.7 g 3   aspirin 81 MG tablet Take 81 mg by mouth  daily.     atorvastatin (LIPITOR) 80 MG tablet Take 1 tablet (80 mg total) by mouth daily. 90 tablet 1   budesonide (PULMICORT) 0.5 MG/2ML nebulizer solution Inhale into the lungs.     Cholecalciferol 25 MCG (1000 UT) tablet Take by mouth.     clobetasol ointment (TEMOVATE) 0.05 % Apply topically as directed. Qd ti bid aa psoriasis on body until clear, then prn flares, avoid face, groin, axilla 60 g 1   clopidogrel (PLAVIX) 75 MG tablet Take 1 tablet (75 mg total) by mouth daily. 90 tablet 3   clotrimazole-betamethasone (LOTRISONE) cream Apply topically 2 (two) times daily 45 g 0   ipratropium-albuterol (DUONEB) 0.5-2.5 (3) MG/3ML SOLN Inhale into the lungs.     lisinopril (ZESTRIL) 5 MG tablet Take 1 tablet (5 mg total) by mouth daily. 90 tablet 3   Risankizumab-rzaa (SKYRIZI PEN) 150 MG/ML SOAJ every 3 (three) months     amLODipine (NORVASC) 5 MG tablet Take 1 tablet (5 mg total) by mouth daily. 90 tablet 3   isosorbide mononitrate (IMDUR) 30 MG 24 hr tablet Take 1 tablet (30 mg total) by mouth daily. 90 tablet 3   mirtazapine (REMERON) 15 MG tablet Take 1 tablet (15 mg total) by mouth at bedtime (Patient not taking: Reported on 03/15/2023) 90 tablet 0   predniSONE (DELTASONE) 2.5 MG tablet Take 1 tablet (2.5 mg total) by mouth daily. (Patient not taking: Reported on 03/15/2023) 90 tablet 0   No current facility-administered medications for this visit.    Allergies:   Codeine   Social History:  The patient  reports that she has been smoking cigarettes. She has a 59 pack-year smoking history. She has never used smokeless tobacco. She reports that she does not drink alcohol and does not use drugs.   Family History:   family history is not on file.   Review of Systems: Review of Systems  Constitutional: Negative.   HENT: Negative.    Respiratory: Negative.    Cardiovascular: Negative.   Gastrointestinal: Negative.   Musculoskeletal: Negative.   Neurological: Negative.    Psychiatric/Behavioral: Negative.    All other systems reviewed and are negative.  PHYSICAL EXAM: VS:  BP 130/62 (BP Location: Left Arm, Patient Position: Sitting, Cuff Size: Normal)   Pulse 69   Ht 5\' 2"  (1.575 m)   Wt 105 lb 12.8 oz (48 kg)   SpO2 98%   BMI 19.35 kg/m  , BMI Body mass index is 19.35 kg/m. Constitutional:  oriented to person, place, and time. No distress.  HENT:  Head: Grossly normal Eyes:  no discharge. No scleral icterus.  Neck: No JVD, no carotid bruits  Cardiovascular: Regular rate and rhythm, no murmurs appreciated Pulmonary/Chest: Clear to auscultation bilaterally, no wheezes or rails Abdominal: Soft.  no distension.  no tenderness.  Musculoskeletal: Normal range of motion Neurological:  normal muscle tone. Coordination normal. No atrophy Skin: Skin warm and dry Psychiatric: normal affect, pleasant  Recent Labs: 01/03/2023: ALT 13; BUN 30; Creatinine, Ser 1.50; Hemoglobin 13.4; Platelets 223; Potassium 4.3; Sodium 136    Lipid Panel No results found for: "CHOL", "HDL", "LDLCALC", "TRIG"    Wt Readings from Last 3 Encounters:  03/15/23 105 lb 12.8 oz (48 kg)  02/10/23 108 lb (49 kg)  01/03/23  106 lb 11.2 oz (48.4 kg)     ASSESSMENT AND PLAN:  Problem List Items Addressed This Visit       Cardiology Problems   AAA (abdominal aortic aneurysm) without rupture (HCC)   Relevant Medications   amLODipine (NORVASC) 5 MG tablet   isosorbide mononitrate (IMDUR) 30 MG 24 hr tablet   Carotid stenosis   Relevant Medications   amLODipine (NORVASC) 5 MG tablet   isosorbide mononitrate (IMDUR) 30 MG 24 hr tablet   Hyperlipidemia   Relevant Medications   amLODipine (NORVASC) 5 MG tablet   isosorbide mononitrate (IMDUR) 30 MG 24 hr tablet   Essential hypertension, benign   Relevant Medications   amLODipine (NORVASC) 5 MG tablet   isosorbide mononitrate (IMDUR) 30 MG 24 hr tablet   Other Visit Diagnoses     Coronary artery disease involving native  coronary artery of native heart, unspecified whether angina present    -  Primary   Relevant Medications   amLODipine (NORVASC) 5 MG tablet   isosorbide mononitrate (IMDUR) 30 MG 24 hr tablet   Other Relevant Orders   EKG 12-Lead (Completed)   Primary hypertension       Relevant Medications   amLODipine (NORVASC) 5 MG tablet   isosorbide mononitrate (IMDUR) 30 MG 24 hr tablet   Other Relevant Orders   EKG 12-Lead (Completed)   Smoking           PAD: AAA followed by vascular locally and at [facility] Mild bilateral carotid disease less than 39% bilaterally Iliac arterial disease on CT scan  Essential hypertension Blood pressure is well controlled on today's visit. No changes made to the medications.  Hyperlipidemia Cholesterol is at goal on the current lipid regimen. No changes to the medications were made.  Coronary artery disease with stable angina Catheterization 2011,  Currently with no symptoms of angina. No further workup at this time. Continue current medication regimen.  Smoking cessation recommended  Chronic renal insufficiency Followed by nephrology Creatinine stable 1.5  COPD Followed by pulmonary Discussed various modalities for smoking cessation including Chantix, nicotine patches, vapor cigarettes as a bridge to quitting as well as other over-the-counter modalities  Signed, Dossie Arbour, M.D., Ph.D. [facility] [place], [place] [phone]

## 2023-03-15 ENCOUNTER — Other Ambulatory Visit (HOSPITAL_COMMUNITY): Payer: Self-pay

## 2023-03-15 ENCOUNTER — Encounter: Payer: Self-pay | Admitting: Cardiovascular Disease

## 2023-03-15 ENCOUNTER — Ambulatory Visit: Payer: HMO | Attending: Cardiovascular Disease | Admitting: Cardiovascular Disease

## 2023-03-15 ENCOUNTER — Other Ambulatory Visit: Payer: Self-pay

## 2023-03-15 VITALS — BP 130/62 | HR 69 | Ht 62.0 in | Wt 105.8 lb

## 2023-03-15 DIAGNOSIS — I7143 Infrarenal abdominal aortic aneurysm, without rupture: Secondary | ICD-10-CM

## 2023-03-15 DIAGNOSIS — I6523 Occlusion and stenosis of bilateral carotid arteries: Secondary | ICD-10-CM

## 2023-03-15 DIAGNOSIS — I2510 Atherosclerotic heart disease of native coronary artery without angina pectoris: Secondary | ICD-10-CM

## 2023-03-15 DIAGNOSIS — IMO0001 Reserved for inherently not codable concepts without codable children: Secondary | ICD-10-CM

## 2023-03-15 DIAGNOSIS — F17200 Nicotine dependence, unspecified, uncomplicated: Secondary | ICD-10-CM | POA: Diagnosis not present

## 2023-03-15 DIAGNOSIS — E782 Mixed hyperlipidemia: Secondary | ICD-10-CM

## 2023-03-15 DIAGNOSIS — I10 Essential (primary) hypertension: Secondary | ICD-10-CM | POA: Diagnosis not present

## 2023-03-15 MED ORDER — AMLODIPINE BESYLATE 5 MG PO TABS
5.0000 mg | ORAL_TABLET | Freq: Every day | ORAL | 3 refills | Status: DC
  Filled 2023-03-15 – 2023-03-24 (×7): qty 90, 90d supply, fill #0
  Filled 2023-06-14: qty 90, 90d supply, fill #1

## 2023-03-15 MED ORDER — AMLODIPINE BESYLATE 5 MG PO TABS: 5.0000 mg | ORAL_TABLET | Freq: Every day | ORAL | 3 refills | Status: DC

## 2023-03-15 MED ORDER — ISOSORBIDE MONONITRATE ER 30 MG PO TB24
30.0000 mg | ORAL_TABLET | Freq: Every day | ORAL | 3 refills | Status: DC
  Filled 2023-03-15: qty 90, 90d supply, fill #0
  Filled 2023-06-14: qty 90, 90d supply, fill #1
  Filled 2023-09-08: qty 90, 90d supply, fill #2

## 2023-03-15 MED ORDER — ISOSORBIDE MONONITRATE ER 30 MG PO TB24: 30.0000 mg | ORAL_TABLET | Freq: Every day | ORAL | 3 refills | Status: DC

## 2023-03-15 NOTE — Patient Instructions (Signed)

## 2023-03-16 ENCOUNTER — Other Ambulatory Visit (HOSPITAL_COMMUNITY): Payer: Self-pay

## 2023-03-17 ENCOUNTER — Other Ambulatory Visit: Payer: Self-pay

## 2023-03-22 ENCOUNTER — Other Ambulatory Visit: Payer: Self-pay

## 2023-03-24 ENCOUNTER — Other Ambulatory Visit (HOSPITAL_COMMUNITY): Payer: Self-pay

## 2023-03-25 ENCOUNTER — Other Ambulatory Visit (HOSPITAL_COMMUNITY): Payer: Self-pay

## 2023-03-30 ENCOUNTER — Other Ambulatory Visit: Payer: Self-pay

## 2023-04-07 ENCOUNTER — Other Ambulatory Visit (HOSPITAL_COMMUNITY): Payer: Self-pay

## 2023-04-21 DIAGNOSIS — E782 Mixed hyperlipidemia: Secondary | ICD-10-CM | POA: Diagnosis not present

## 2023-05-07 ENCOUNTER — Other Ambulatory Visit (HOSPITAL_BASED_OUTPATIENT_CLINIC_OR_DEPARTMENT_OTHER): Payer: Self-pay

## 2023-05-07 ENCOUNTER — Other Ambulatory Visit (HOSPITAL_COMMUNITY): Payer: Self-pay

## 2023-05-07 DIAGNOSIS — Z0000 Encounter for general adult medical examination without abnormal findings: Secondary | ICD-10-CM | POA: Diagnosis not present

## 2023-05-07 DIAGNOSIS — E782 Mixed hyperlipidemia: Secondary | ICD-10-CM | POA: Diagnosis not present

## 2023-05-07 DIAGNOSIS — J449 Chronic obstructive pulmonary disease, unspecified: Secondary | ICD-10-CM | POA: Diagnosis not present

## 2023-05-07 DIAGNOSIS — F17200 Nicotine dependence, unspecified, uncomplicated: Secondary | ICD-10-CM | POA: Diagnosis not present

## 2023-05-07 DIAGNOSIS — N184 Chronic kidney disease, stage 4 (severe): Secondary | ICD-10-CM | POA: Diagnosis not present

## 2023-05-07 DIAGNOSIS — G4733 Obstructive sleep apnea (adult) (pediatric): Secondary | ICD-10-CM | POA: Diagnosis not present

## 2023-05-07 DIAGNOSIS — I10 Essential (primary) hypertension: Secondary | ICD-10-CM | POA: Diagnosis not present

## 2023-05-07 MED ORDER — CLOTRIMAZOLE-BETAMETHASONE 1-0.05 % EX CREA
1.0000 | TOPICAL_CREAM | Freq: Two times a day (BID) | CUTANEOUS | 0 refills | Status: DC
  Filled 2023-05-07: qty 45, 23d supply, fill #0

## 2023-05-08 DIAGNOSIS — U071 COVID-19: Secondary | ICD-10-CM

## 2023-05-08 HISTORY — DX: COVID-19: U07.1

## 2023-05-12 DIAGNOSIS — Z961 Presence of intraocular lens: Secondary | ICD-10-CM | POA: Diagnosis not present

## 2023-05-12 DIAGNOSIS — H353131 Nonexudative age-related macular degeneration, bilateral, early dry stage: Secondary | ICD-10-CM | POA: Diagnosis not present

## 2023-05-12 DIAGNOSIS — H18513 Endothelial corneal dystrophy, bilateral: Secondary | ICD-10-CM | POA: Diagnosis not present

## 2023-05-14 DIAGNOSIS — R911 Solitary pulmonary nodule: Secondary | ICD-10-CM | POA: Diagnosis not present

## 2023-05-14 DIAGNOSIS — F17200 Nicotine dependence, unspecified, uncomplicated: Secondary | ICD-10-CM | POA: Diagnosis not present

## 2023-05-14 DIAGNOSIS — J449 Chronic obstructive pulmonary disease, unspecified: Secondary | ICD-10-CM | POA: Diagnosis not present

## 2023-05-14 DIAGNOSIS — G4733 Obstructive sleep apnea (adult) (pediatric): Secondary | ICD-10-CM | POA: Diagnosis not present

## 2023-05-14 DIAGNOSIS — R42 Dizziness and giddiness: Secondary | ICD-10-CM | POA: Diagnosis not present

## 2023-05-14 DIAGNOSIS — R5381 Other malaise: Secondary | ICD-10-CM | POA: Diagnosis not present

## 2023-05-14 DIAGNOSIS — J438 Other emphysema: Secondary | ICD-10-CM | POA: Diagnosis not present

## 2023-05-18 DIAGNOSIS — R591 Generalized enlarged lymph nodes: Secondary | ICD-10-CM | POA: Diagnosis not present

## 2023-05-18 DIAGNOSIS — D110 Benign neoplasm of parotid gland: Secondary | ICD-10-CM | POA: Diagnosis not present

## 2023-05-18 DIAGNOSIS — H6983 Other specified disorders of Eustachian tube, bilateral: Secondary | ICD-10-CM | POA: Diagnosis not present

## 2023-05-24 ENCOUNTER — Other Ambulatory Visit (HOSPITAL_COMMUNITY): Payer: Self-pay

## 2023-05-24 ENCOUNTER — Other Ambulatory Visit: Payer: Self-pay

## 2023-05-24 MED ORDER — ALBUTEROL SULFATE HFA 108 (90 BASE) MCG/ACT IN AERS
2.0000 | INHALATION_SPRAY | Freq: Four times a day (QID) | RESPIRATORY_TRACT | 3 refills | Status: AC | PRN
  Filled 2023-05-24: qty 20.1, 75d supply, fill #0
  Filled 2024-02-15: qty 20.1, 75d supply, fill #1

## 2023-05-31 ENCOUNTER — Other Ambulatory Visit: Payer: Self-pay

## 2023-05-31 DIAGNOSIS — J441 Chronic obstructive pulmonary disease with (acute) exacerbation: Secondary | ICD-10-CM | POA: Diagnosis not present

## 2023-05-31 DIAGNOSIS — N1832 Chronic kidney disease, stage 3b: Secondary | ICD-10-CM | POA: Diagnosis not present

## 2023-05-31 DIAGNOSIS — F17210 Nicotine dependence, cigarettes, uncomplicated: Secondary | ICD-10-CM | POA: Diagnosis not present

## 2023-05-31 DIAGNOSIS — J988 Other specified respiratory disorders: Secondary | ICD-10-CM | POA: Diagnosis not present

## 2023-05-31 DIAGNOSIS — I129 Hypertensive chronic kidney disease with stage 1 through stage 4 chronic kidney disease, or unspecified chronic kidney disease: Secondary | ICD-10-CM | POA: Diagnosis not present

## 2023-05-31 DIAGNOSIS — R0602 Shortness of breath: Secondary | ICD-10-CM | POA: Diagnosis not present

## 2023-05-31 DIAGNOSIS — N2581 Secondary hyperparathyroidism of renal origin: Secondary | ICD-10-CM | POA: Diagnosis not present

## 2023-05-31 DIAGNOSIS — I10 Essential (primary) hypertension: Secondary | ICD-10-CM | POA: Diagnosis not present

## 2023-05-31 DIAGNOSIS — Z8616 Personal history of COVID-19: Secondary | ICD-10-CM

## 2023-05-31 DIAGNOSIS — R809 Proteinuria, unspecified: Secondary | ICD-10-CM | POA: Diagnosis not present

## 2023-05-31 HISTORY — DX: Personal history of COVID-19: Z86.16

## 2023-05-31 MED ORDER — IPRATROPIUM-ALBUTEROL 0.5-2.5 (3) MG/3ML IN SOLN
3.0000 mL | Freq: Four times a day (QID) | RESPIRATORY_TRACT | 11 refills | Status: AC | PRN
  Filled 2023-05-31: qty 180, 15d supply, fill #0

## 2023-05-31 MED ORDER — AZITHROMYCIN 250 MG PO TABS
ORAL_TABLET | ORAL | 0 refills | Status: AC
  Filled 2023-05-31: qty 6, 5d supply, fill #0

## 2023-05-31 MED ORDER — METHYLPREDNISOLONE 4 MG PO TBPK
ORAL_TABLET | ORAL | 0 refills | Status: AC
  Filled 2023-05-31: qty 21, 6d supply, fill #0

## 2023-05-31 MED ORDER — GUAIFENESIN ER 600 MG PO TB12
600.0000 mg | ORAL_TABLET | Freq: Two times a day (BID) | ORAL | 0 refills | Status: AC | PRN
  Filled 2023-05-31: qty 20, 10d supply, fill #0

## 2023-05-31 MED ORDER — BENZONATATE 100 MG PO CAPS
100.0000 mg | ORAL_CAPSULE | Freq: Three times a day (TID) | ORAL | 0 refills | Status: AC | PRN
  Filled 2023-05-31: qty 20, 7d supply, fill #0

## 2023-05-31 MED ORDER — BUDESONIDE 0.5 MG/2ML IN SUSP
2.0000 mL | Freq: Two times a day (BID) | RESPIRATORY_TRACT | 2 refills | Status: DC
  Filled 2023-05-31: qty 180, 45d supply, fill #0

## 2023-06-01 ENCOUNTER — Other Ambulatory Visit: Payer: Self-pay

## 2023-06-11 ENCOUNTER — Other Ambulatory Visit: Payer: Self-pay

## 2023-06-14 ENCOUNTER — Other Ambulatory Visit (HOSPITAL_COMMUNITY): Payer: Self-pay

## 2023-06-14 ENCOUNTER — Ambulatory Visit
Admission: RE | Admit: 2023-06-14 | Discharge: 2023-06-14 | Disposition: A | Discharge status: Home / Self Care | Payer: HMO | Source: Ambulatory Visit | Attending: Radiation Oncology | Admitting: Radiation Oncology

## 2023-06-14 DIAGNOSIS — R911 Solitary pulmonary nodule: Secondary | ICD-10-CM | POA: Diagnosis not present

## 2023-06-14 DIAGNOSIS — J432 Centrilobular emphysema: Secondary | ICD-10-CM | POA: Diagnosis not present

## 2023-06-14 DIAGNOSIS — R591 Generalized enlarged lymph nodes: Secondary | ICD-10-CM | POA: Diagnosis not present

## 2023-06-14 LAB — POCT I-STAT CREATININE: Creatinine, Ser: 1.7 mg/dL — ABNORMAL HIGH (ref 0.44–1.00)

## 2023-06-14 MED ORDER — IOHEXOL 300 MG/ML  SOLN
75.0000 mL | Freq: Once | INTRAMUSCULAR | Status: AC | PRN
  Administered 2023-06-14: 60 mL via INTRAVENOUS

## 2023-06-15 ENCOUNTER — Other Ambulatory Visit: Payer: Self-pay

## 2023-06-15 ENCOUNTER — Other Ambulatory Visit (HOSPITAL_COMMUNITY): Payer: Self-pay

## 2023-06-15 DIAGNOSIS — G4733 Obstructive sleep apnea (adult) (pediatric): Secondary | ICD-10-CM | POA: Diagnosis not present

## 2023-06-15 DIAGNOSIS — J449 Chronic obstructive pulmonary disease, unspecified: Secondary | ICD-10-CM | POA: Diagnosis not present

## 2023-06-15 MED ORDER — ATORVASTATIN CALCIUM 80 MG PO TABS
80.0000 mg | ORAL_TABLET | Freq: Every day | ORAL | 1 refills | Status: DC
  Filled 2023-07-05 – 2023-07-08 (×2): qty 100, 100d supply, fill #0
  Filled 2023-12-06: qty 100, 100d supply, fill #1

## 2023-06-15 MED ORDER — FLUTICASONE-SALMETEROL 230-21 MCG/ACT IN AERO
2.0000 | INHALATION_SPRAY | Freq: Two times a day (BID) | RESPIRATORY_TRACT | 12 refills | Status: AC
  Filled 2023-06-15: qty 12, 30d supply, fill #0
  Filled 2023-08-17: qty 12, 30d supply, fill #1
  Filled 2023-09-21: qty 12, 30d supply, fill #2
  Filled 2023-12-06: qty 12, 30d supply, fill #3
  Filled 2024-01-14: qty 12, 30d supply, fill #4
  Filled 2024-02-08: qty 12, 30d supply, fill #5
  Filled 2024-03-12: qty 12, 30d supply, fill #6
  Filled 2024-04-14 – 2024-05-01 (×2): qty 12, 30d supply, fill #7

## 2023-06-21 ENCOUNTER — Other Ambulatory Visit (HOSPITAL_COMMUNITY): Payer: Self-pay

## 2023-06-21 ENCOUNTER — Other Ambulatory Visit: Payer: Self-pay

## 2023-06-21 DIAGNOSIS — R809 Proteinuria, unspecified: Secondary | ICD-10-CM | POA: Diagnosis not present

## 2023-06-21 DIAGNOSIS — I129 Hypertensive chronic kidney disease with stage 1 through stage 4 chronic kidney disease, or unspecified chronic kidney disease: Secondary | ICD-10-CM | POA: Diagnosis not present

## 2023-06-21 DIAGNOSIS — N2581 Secondary hyperparathyroidism of renal origin: Secondary | ICD-10-CM | POA: Diagnosis not present

## 2023-06-21 DIAGNOSIS — N1832 Chronic kidney disease, stage 3b: Secondary | ICD-10-CM | POA: Diagnosis not present

## 2023-06-21 MED ORDER — AMLODIPINE BESYLATE 2.5 MG PO TABS
2.5000 mg | ORAL_TABLET | Freq: Every day | ORAL | 3 refills | Status: DC
  Filled 2023-06-21: qty 90, 90d supply, fill #0
  Filled 2023-09-21: qty 90, 90d supply, fill #1

## 2023-06-21 MED ORDER — MIRTAZAPINE 15 MG PO TABS
15.0000 mg | ORAL_TABLET | Freq: Every evening | ORAL | 3 refills | Status: AC
  Filled 2023-06-21: qty 90, 90d supply, fill #0
  Filled 2023-11-23: qty 90, 90d supply, fill #1
  Filled 2024-02-15: qty 90, 90d supply, fill #2

## 2023-06-24 ENCOUNTER — Other Ambulatory Visit: Payer: Self-pay | Admitting: *Deleted

## 2023-06-24 ENCOUNTER — Telehealth: Payer: Self-pay | Admitting: *Deleted

## 2023-06-24 ENCOUNTER — Encounter: Payer: Self-pay | Admitting: Radiation Oncology

## 2023-06-24 ENCOUNTER — Ambulatory Visit
Admission: RE | Admit: 2023-06-24 | Discharge: 2023-06-24 | Disposition: A | Discharge status: Home / Self Care | Payer: HMO | Source: Ambulatory Visit | Attending: Radiation Oncology | Admitting: Radiation Oncology

## 2023-06-24 VITALS — BP 172/92 | HR 82 | Temp 96.6°F | Resp 20 | Wt 103.4 lb

## 2023-06-24 DIAGNOSIS — R911 Solitary pulmonary nodule: Secondary | ICD-10-CM

## 2023-06-24 DIAGNOSIS — F17210 Nicotine dependence, cigarettes, uncomplicated: Secondary | ICD-10-CM | POA: Diagnosis not present

## 2023-06-24 DIAGNOSIS — Z923 Personal history of irradiation: Secondary | ICD-10-CM | POA: Diagnosis not present

## 2023-06-24 DIAGNOSIS — I714 Abdominal aortic aneurysm, without rupture, unspecified: Secondary | ICD-10-CM | POA: Insufficient documentation

## 2023-06-24 NOTE — Telephone Encounter (Signed)
 Called patient to advise her to follow up with Vascular regarding arotic aneurysm. Patient is now being followed by Dr, Pattricia Boss at [facility] for assessment and treatment of aneurysm she has an appointment in June.

## 2023-06-24 NOTE — Progress Notes (Signed)
 Radiation Oncology Follow up Note  Name: Lisa Wilkerson   Date:   06/24/2023 MRN:  [id number] DOB: 07-Apr-1945    This 78 y.o. female presents to the clinic today for 27-month follow-up status post initial consultation for a lesion of the left upper lobe which we are monitoring.Marland Kitchen  REFERRING PROVIDER: Marisue Ivan, MD  HPI: Patient is a 78 year old female who initially consulted 5 months prior for left upper lobe nodule slightly progressing over time.  It was slightly hypermetabolic worrisome for primary non-small cell lung cancer.  I opted to scan her in 5 months..  Her new CT scan showed no substantial change in mixed attenuation left upper lobe pulmonary nodule although adenocarcinoma remains a concern.  She does have an abdominal aortic aneurysm measuring up to 4.6 cm  COMPLICATIONS OF TREATMENT: none  FOLLOW UP COMPLIANCE: keeps appointments   PHYSICAL EXAM:  BP (!) 172/92   Pulse 82   Temp (!) 96.6 F (35.9 C)   Resp 20   Wt 103 lb 6.4 oz (46.9 kg)   SpO2 100%   BMI 18.91 kg/m  Well-developed well-nourished patient in NAD. HEENT reveals PERLA, EOMI, discs not visualized.  Oral cavity is clear. No oral mucosal lesions are identified. Neck is clear without evidence of cervical or supraclavicular adenopathy. Lungs are clear to A&P. Cardiac examination is essentially unremarkable with regular rate and rhythm without murmur rub or thrill. Abdomen is benign with no organomegaly or masses noted. Motor sensory and DTR levels are equal and symmetric in the upper and lower extremities. Cranial nerves II through XII are grossly intact. Proprioception is intact. No peripheral adenopathy or edema is identified. No motor or sensory levels are noted. Crude visual fields are within normal range.  RADIOLOGY RESULTS: CT scans reviewed compatible with above-stated findings  PLAN: Present time I will repeat her scans in 6 months should he show progression would offer SBRT.  Again I have gone over  the risk and benefits of SBRT and extremely low side effect profile.  We will also asked the patient to see vascular surgery Dr. Gilda Crease.  Patient knows to call the meantime with any concerns.  I would like to take this opportunity to thank you for allowing me to participate in the care of your patient.Carmina Miller, MD

## 2023-07-05 ENCOUNTER — Other Ambulatory Visit (HOSPITAL_COMMUNITY): Payer: Self-pay

## 2023-07-05 ENCOUNTER — Other Ambulatory Visit: Payer: Self-pay

## 2023-07-08 ENCOUNTER — Other Ambulatory Visit: Payer: Self-pay

## 2023-08-16 ENCOUNTER — Other Ambulatory Visit: Payer: Self-pay

## 2023-08-16 DIAGNOSIS — R911 Solitary pulmonary nodule: Secondary | ICD-10-CM | POA: Diagnosis not present

## 2023-08-16 DIAGNOSIS — R599 Enlarged lymph nodes, unspecified: Secondary | ICD-10-CM | POA: Diagnosis not present

## 2023-08-16 DIAGNOSIS — F17210 Nicotine dependence, cigarettes, uncomplicated: Secondary | ICD-10-CM | POA: Diagnosis not present

## 2023-08-16 DIAGNOSIS — J449 Chronic obstructive pulmonary disease, unspecified: Secondary | ICD-10-CM | POA: Diagnosis not present

## 2023-08-16 DIAGNOSIS — R110 Nausea: Secondary | ICD-10-CM | POA: Diagnosis not present

## 2023-08-16 MED ORDER — PREDNISONE 10 MG PO TABS
ORAL_TABLET | ORAL | 0 refills | Status: DC
  Filled 2023-08-16: qty 11, 10d supply, fill #0

## 2023-08-16 MED ORDER — ONDANSETRON HCL 4 MG PO TABS
4.0000 mg | ORAL_TABLET | Freq: Three times a day (TID) | ORAL | 0 refills | Status: DC | PRN
Start: 2023-08-16 — End: 2023-08-23
  Filled 2023-08-16: qty 30, 10d supply, fill #0

## 2023-08-16 MED ORDER — AMOXICILLIN-POT CLAVULANATE 875-125 MG PO TABS
1.0000 | ORAL_TABLET | Freq: Two times a day (BID) | ORAL | 0 refills | Status: DC
  Filled 2023-08-16: qty 20, 10d supply, fill #0

## 2023-08-17 ENCOUNTER — Other Ambulatory Visit: Payer: Self-pay | Admitting: Pulmonary Disease

## 2023-08-17 ENCOUNTER — Other Ambulatory Visit: Payer: Self-pay

## 2023-08-17 ENCOUNTER — Other Ambulatory Visit (HOSPITAL_COMMUNITY): Payer: Self-pay

## 2023-08-18 ENCOUNTER — Telehealth: Payer: Self-pay | Admitting: *Deleted

## 2023-08-18 NOTE — Telephone Encounter (Signed)
   Pre-operative Risk Assessment    Patient Name: Lisa Wilkerson  DOB: 06-22-1945 MRN: [id number]   Date of last office visit: 03/15/23 DR. GOLLAN Date of next office visit: NONE   Request for Surgical Clearance    Procedure:  EBUS/ROBOTIC BRONCHOSCOPY   Date of Surgery:  Clearance 09/03/23                                Surgeon:  DR. Glennette Lanius Surgeon's Group or Practice Name:  [facility]  Phone number:  [phone] Fax number:  [phone]   Type of Clearance Requested:   - Medical  - Pharmacy:  Hold Aspirin  and Clopidogrel  (Plavix )     Type of Anesthesia:  General    Additional requests/questions:    Princeton Broom   08/18/2023, 8:53 AM

## 2023-08-19 ENCOUNTER — Other Ambulatory Visit (HOSPITAL_COMMUNITY): Payer: Self-pay

## 2023-08-20 NOTE — Telephone Encounter (Signed)
Tried to call the pt to set up tele pre op appt, though no answer

## 2023-08-20 NOTE — Telephone Encounter (Signed)
   Name: Lisa Wilkerson  DOB: [birth year]  MRN: [id number]  Primary Cardiologist: Belva Boyden, MD   Preoperative team, please contact this patient and set up a phone call appointment for further preoperative risk assessment. Please obtain consent and complete medication review. Thank you for your help.  I confirm that guidance regarding antiplatelet and oral anticoagulation therapy has been completed and, if necessary, noted below.  Per office protocol, if patient is without any new symptoms or concerns at the time of their virtual visit, she may hold Aspirin  and Plavix  for 5 days prior to procedure. Please resume Aspirin  and Plavix  as soon as possible postprocedure, at the discretion of the surgeon.    I also confirmed the patient resides in the state of [place] . As per [facility] Medical Board telemedicine laws, the patient must reside in the state in which the provider is licensed.   Ava Boatman, NP 08/20/2023, 8:19 AM [facility]

## 2023-08-23 ENCOUNTER — Telehealth: Payer: Self-pay

## 2023-08-23 NOTE — Telephone Encounter (Signed)
 Appt scheduled for 08/26/23 @ 2:40pm. Med rec and consent complete. Call patient at [phone].

## 2023-08-23 NOTE — Telephone Encounter (Signed)
  Patient Consent for Virtual Visit        Lisa Wilkerson has provided verbal consent on 08/23/2023 for a virtual visit (video or telephone).  Appt scheduled for 08/26/23 @ 2:40pm. Med rec and consent complete. Call patient at [phone].    CONSENT FOR VIRTUAL VISIT FOR:  Lisa Wilkerson  By participating in this virtual visit I agree to the following:  I hereby voluntarily request, consent and authorize [facility] and its employed or contracted physicians, physician assistants, nurse practitioners or other licensed health care professionals (the Practitioner), to provide me with telemedicine health care services (the "Services") as deemed necessary by the treating Practitioner. I acknowledge and consent to receive the Services by the Practitioner via telemedicine. I understand that the telemedicine visit will involve communicating with the Practitioner through live audiovisual communication technology and the disclosure of certain medical information by electronic transmission. I acknowledge that I have been given the opportunity to request an in-person assessment or other available alternative prior to the telemedicine visit and am voluntarily participating in the telemedicine visit.  I understand that I have the right to withhold or withdraw my consent to the use of telemedicine in the course of my care at any time, without affecting my right to future care or treatment, and that the Practitioner or I may terminate the telemedicine visit at any time. I understand that I have the right to inspect all information obtained and/or recorded in the course of the telemedicine visit and may receive copies of available information for a reasonable fee.  I understand that some of the potential risks of receiving the Services via telemedicine include:  Delay or interruption in medical evaluation due to technological equipment failure or disruption; Information transmitted may not be  sufficient (e.g. poor resolution of images) to allow for appropriate medical decision making by the Practitioner; and/or  In rare instances, security protocols could fail, causing a breach of personal health information.  Furthermore, I acknowledge that it is my responsibility to provide information about my medical history, conditions and care that is complete and accurate to the best of my ability. I acknowledge that Practitioner's advice, recommendations, and/or decision may be based on factors not within their control, such as incomplete or inaccurate data provided by me or distortions of diagnostic images or specimens that may result from electronic transmissions. I understand that the practice of medicine is not an exact science and that Practitioner makes no warranties or guarantees regarding treatment outcomes. I acknowledge that a copy of this consent can be made available to me via my patient portal [facility] MyChart), or I can request a printed copy by calling the office of [facility].    I understand that my insurance will be billed for this visit.   I have read or had this consent read to me. I understand the contents of this consent, which adequately explains the benefits and risks of the Services being provided via telemedicine.  I have been provided ample opportunity to ask questions regarding this consent and the Services and have had my questions answered to my satisfaction. I give my informed consent for the services to be provided through the use of telemedicine in my medical care

## 2023-08-24 ENCOUNTER — Encounter (INDEPENDENT_AMBULATORY_CARE_PROVIDER_SITE_OTHER): Payer: Self-pay

## 2023-08-26 ENCOUNTER — Ambulatory Visit: Attending: Cardiology

## 2023-08-26 ENCOUNTER — Other Ambulatory Visit: Payer: Self-pay

## 2023-08-26 ENCOUNTER — Encounter
Admission: RE | Admit: 2023-08-26 | Discharge: 2023-08-26 | Disposition: A | Discharge status: Home / Self Care | Source: Ambulatory Visit | Attending: Pulmonary Disease | Admitting: Pulmonary Disease

## 2023-08-26 DIAGNOSIS — Z01810 Encounter for preprocedural cardiovascular examination: Secondary | ICD-10-CM

## 2023-08-26 HISTORY — DX: Presence of dental prosthetic device (complete) (partial): K08.109

## 2023-08-26 HISTORY — DX: Chronic obstructive pulmonary disease, unspecified: J44.9

## 2023-08-26 HISTORY — DX: Occlusion and stenosis of unspecified carotid artery: I65.29

## 2023-08-26 HISTORY — DX: Chronic kidney disease, stage 3 unspecified: N18.30

## 2023-08-26 HISTORY — DX: Abdominal aortic aneurysm, without rupture, unspecified: I71.40

## 2023-08-26 NOTE — Patient Instructions (Addendum)
 Your procedure is scheduled on: Friday 09/03/23 To find out your arrival time, please call [phone] between 1PM - 3PM on:   Thursday 09/02/23 Report to the Registration Desk on the 1st floor of the Medical Mall. Free Valet parking is available.  If your arrival time is 6:00 am, do not arrive before that time as the Medical Mall entrance doors do not open until 6:00 am.  REMEMBER: Instructions that are not followed completely may result in serious medical risk, up to and including death; or upon the discretion of your surgeon and anesthesiologist your surgery may need to be rescheduled.  Do not eat food or drink any liquids after midnight the night before surgery.  No gum chewing or hard candies.  One week prior to surgery: Stop Anti-inflammatories (NSAIDS) such as Advil, Aleve, Ibuprofen, Motrin, Naproxen, Naprosyn and Aspirin  based products such as Excedrin, Goody's Powder, BC Powder. You may however, continue to take Tylenol  if needed for pain up until the day of surgery.  Stop ANY OVER THE COUNTER supplements and vitamins until after surgery.  Continue taking all prescribed medications with the exception of the following: Plavix  and Aspirin  last dose will be Saturday 08/28/23. You will be told when to restart after your procedure  TAKE ONLY THESE MEDICATIONS THE MORNING OF SURGERY WITH A SIP OF WATER:  isosorbide  mononitrate (IMDUR ) 30 MG 24 hr tablet   Use inhalers on the day of surgery and bring the rescue or Albuterol  to the hospital.  No Alcohol for 24 hours before or after surgery.  No Smoking including e-cigarettes for 24 hours before surgery.   No nicotine patches on the day of surgery.  Do not use any "recreational" drugs for at least a week (preferably 2 weeks) before your surgery.  Please be advised that the combination of cocaine and anesthesia may have negative outcomes, up to and including death. If you test positive for cocaine, your surgery will be  cancelled.  On the morning of surgery brush your teeth with toothpaste and water, you may rinse your mouth with mouthwash if you wish. Do not swallow any toothpaste or mouthwash.  Regular shower the day of your procedure  Do not wear lotions, powders, or perfumes.   Do not shave body hair from the neck down 48 hours before surgery.  Wear comfortable clothing (specific to your surgery type) to the hospital.  Do not wear jewelry, make-up, hairpins, clips or nail polish.  For welded (permanent) jewelry: bracelets, anklets, waist bands, etc.  Please have this removed prior to surgery.  If it is not removed, there is a chance that hospital personnel will need to cut it off on the day of surgery. Contact lenses, hearing aids and dentures may not be worn into surgery.  Do not bring valuables to the hospital. [facility] is not responsible for any missing/lost belongings or valuables.   Notify your doctor if there is any change in your medical condition (cold, fever, infection).  If you are being discharged the day of surgery, you will not be allowed to drive home. You will need a responsible individual to drive you home and stay with you for 24 hours after surgery.   After surgery, you can help prevent lung complications by doing breathing exercises.  Take deep breaths and cough every 1-2 hours. Your doctor may order a device called an Incentive Spirometer to help you take deep breaths.  Surgery Visitation Policy:  Patients undergoing a surgery or procedure may have two  family members or support persons with them as long as the person is not COVID-19 positive or experiencing its symptoms.   Please call the Pre-admissions Testing Dept. at [phone] if you have any questions about these instructions.

## 2023-08-26 NOTE — Progress Notes (Signed)
 Virtual Visit via Telephone Note   Because of Lisa Wilkerson co-morbid illnesses, she is at least at moderate risk for complications without adequate follow up.  This format is felt to be most appropriate for this patient at this time.  Due to technical limitations with video connection Web designer), today's appointment will be conducted as an audio only telehealth visit, and Lisa Wilkerson verbally agreed to proceed in this manner.   All issues noted in this document were discussed and addressed.  No physical exam could be performed with this format.  Evaluation Performed:  Preoperative cardiovascular risk assessment _____________   Date:  08/26/2023   Patient ID:  Lisa Wilkerson, DOB 21-Dec-1945, MRN [id number] Patient Location:  Home Provider location:   Office  Primary Care Provider:  Monique Ano, MD Primary Cardiologist:  Belva Boyden, MD  Chief Complaint / Patient Profile   78 y.o. y/o female with a h/o PAD s/p CEA 2017, AAA, HTN, HLD, tobacco abuse, COPD, TIA, CAD s/p NSTEMI 07/2009 who is pending EBUS/robotic bronchoscopy and presents today for telephonic preoperative cardiovascular risk assessment.  History of Present Illness    Lisa Wilkerson is a 78 y.o. female who presents via audio/video conferencing for a telehealth visit today.  Pt was last seen in cardiology clinic on 03/15/2023 by Dr. Gollan.  At that time Lisa Wilkerson was doing well with controlled blood pressures and no new cardiac complaints.  The patient is now pending procedure as outlined above. Since her last visit, she has been doing well with no new cardiac complaints.  She is capable of completing 4 METS of activity without any difficulty.  She is limited with movement due to her back but denies any cardiac limitations.  She reports during the call experiencing some hypotensive symptoms particularly noted at her nephrologist office.  She drinks at least 32 ounces of water daily and reports no falls or  presyncopal episodes.    She denies chest pain, shortness of breath, lower extremity edema, fatigue, palpitations, melena, hematuria, hemoptysis, diaphoresis, weakness, presyncope, syncope, orthopnea, and PND.   Past Medical History    Past Medical History:  Diagnosis Date   Actinic keratosis 10/29/2014   L hand thenar - biopsy proven   Actinic keratosis 08/17/2017   L forearm - biopsy proven   Arthritis    Basal cell carcinoma of skin    Chronic kidney disease    Stage 3 Chronic Kidney Disease   Coronary artery disease    Hyperlipidemia    Hypertension    Myocardial infarction (HCC) 07/2009   Peripheral vascular disease (HCC)    Psoriasis    Sleep apnea    no CPAP   Squamous cell carcinoma of skin 07/13/2017   L hand dorsum SCCIS   Squamous cell carcinoma of skin 11/23/2017   L mid forearm    Squamous cell carcinoma of skin 01/07/2022   Right Shoulder - Posterior, [facility]   Squamous cell skin cancer 06/18/2014   L lat lower leg below the knee   TIA (transient ischemic attack) 2014   no deficits   Vertigo    no issues for several years   Wears dentures    full upper and lower   Past Surgical History:  Procedure Laterality Date   CARDIAC CATHETERIZATION  2011   ENDARTERECTOMY Left 11/28/2015   Procedure: ENDARTERECTOMY CAROTID;  Surgeon: Celso College, MD;  Location: [facility];  Service: Vascular;  Laterality: Left;   EYE SURGERY Bilateral  Cataract Extraction with IOL   MYRINGOTOMY WITH TUBE PLACEMENT Bilateral 08/19/2016   Procedure: MYRINGOTOMY WITH TUBE PLACEMENT;  Surgeon: Rogers Clayman, MD;  Location: [facility];  Service: ENT;  Laterality: Bilateral;  sleep apnea   MYRINGOTOMY WITH TUBE PLACEMENT Bilateral 08/04/2017   Procedure: MYRINGOTOMY WITH TUBE PLACEMENT           BUTTERFLY TUBES;  Surgeon: Rogers Clayman, MD;  Location: [facility];  Service: ENT;  Laterality: Bilateral;   MYRINGOTOMY WITH TUBE PLACEMENT Bilateral 06/24/2022   Procedure:  MYRINGOTOMY WITH TUBE PLACEMENT;  Surgeon: Rogers Clayman, MD;  Location: [facility];  Service: ENT;  Laterality: Bilateral;   UVULOPALATOPHARYNGOPLASTY  2000    Allergies  Allergies  Allergen Reactions   Codeine Other (See Comments)    She states it paralyzes her and she can't move. Other reaction(s): Other (see comments) Other reaction(s): Other (See Comments) She states it paralyzes her and she can't move. "Cannot move, paralyzes me"    Home Medications    Prior to Admission medications   Medication Sig Start Date End Date Taking? Authorizing Provider  albuterol  (PROVENTIL  HFA) 108 (90 Base) MCG/ACT inhaler Inhale 2 puffs into the lungs every 6 (six) hours as needed for wheezing. 05/24/23     amLODipine  (NORVASC ) 2.5 MG tablet Take 1 tablet (2.5 mg total) by mouth daily. 06/21/23     amoxicillin -clavulanate (AUGMENTIN ) 875-125 MG tablet Take 1 tablet by mouth every 12 (twelve) hours. 08/16/23     aspirin  81 MG tablet Take 81 mg by mouth daily.    [provider]  atorvastatin  (LIPITOR) 80 MG tablet Take 1 tablet (80 mg total) by mouth daily. 06/15/23     Cholecalciferol 25 MCG (1000 UT) tablet Take by mouth.    [provider]  clobetasol  ointment (TEMOVATE ) 0.05 % Apply topically as directed. Qd ti bid aa psoriasis on body until clear, then prn flares, avoid face, groin, axilla 08/17/19   Artemio Larry, MD  clopidogrel  (PLAVIX ) 75 MG tablet Take 1 tablet (75 mg total) by mouth daily. 10/05/22   Constancia Delton, MD  clotrimazole -betamethasone  (LOTRISONE ) cream Apply topically 2 (two) times daily 10/22/22     fluticasone -salmeterol (ADVAIR  HFA) 230-21 MCG/ACT inhaler Inhale 2 puffs into the lungs every 12 (twelve) hours. 06/15/23     ipratropium-albuterol  (DUONEB) 0.5-2.5 (3) MG/3ML SOLN Take 3 mLs by nebulization 4 (four) times daily as needed for Shortness of Breath 05/31/23     isosorbide  mononitrate (IMDUR ) 30 MG 24 hr tablet Take 1 tablet (30 mg total) by  mouth daily. 03/15/23   Gollan, Timothy J, MD  lisinopril  (ZESTRIL ) 5 MG tablet Take 1 tablet (5 mg total) by mouth daily. 10/05/22   Constancia Delton, MD  mirtazapine  (REMERON ) 15 MG tablet Take 1 tablet (15 mg total) by mouth Nightly. 06/21/23     predniSONE  (DELTASONE ) 10 MG tablet Take 2 tablets (20 mg total) by mouth once daily for 3 days, THEN 1 tablet (10 mg total) once daily for 3 days, THEN 0.5 tablets (5 mg total) once daily for 4 days. 08/16/23     Risankizumab -rzaa (SKYRIZI  PEN) 150 MG/ML SOAJ every 3 (three) months 05/01/21   [provider]    Physical Exam    Vital Signs:  MALISSA SLAY does not have vital signs available for review today.  Given telephonic nature of communication, physical exam is limited. AAOx3. NAD. Normal affect.  Speech and respirations are unlabored.  Accessory Clinical Findings  None  Assessment & Plan    1.  Preoperative Cardiovascular Risk Assessment: - Patient's RCRI score is 11%  The patient affirms she has been doing well without any new cardiac symptoms. They are able to achieve 4 METS without cardiac limitations. Therefore, based on ACC/AHA guidelines, the patient would be at acceptable risk for the planned procedure without further cardiovascular testing. The patient was advised that if she develops new symptoms prior to surgery to contact our office to arrange for a follow-up visit, and she verbalized understanding.   The patient was advised that if she develops new symptoms prior to surgery to contact our office to arrange for a follow-up visit, and she verbalized understanding.  Per office protocol, if patient is without any new symptoms or concerns at the time of their virtual visit, she may hold Aspirin  and Plavix  for 5 days prior to procedure.   A copy of this note will be routed to requesting surgeon.  Time:   Today, I have spent 7 minutes with the patient with telehealth technology discussing medical history, symptoms,  and management plan.     Lisa Wilkerson, Lisa Cast, NP  08/26/2023, 7:45 AM

## 2023-08-27 ENCOUNTER — Other Ambulatory Visit: Payer: Self-pay | Admitting: Pulmonary Disease

## 2023-08-27 DIAGNOSIS — R911 Solitary pulmonary nodule: Secondary | ICD-10-CM

## 2023-09-01 ENCOUNTER — Ambulatory Visit
Admission: RE | Admit: 2023-09-01 | Discharge: 2023-09-01 | Disposition: A | Discharge status: Home / Self Care | Source: Ambulatory Visit | Attending: Pulmonary Disease | Admitting: Pulmonary Disease

## 2023-09-01 ENCOUNTER — Other Ambulatory Visit: Payer: Self-pay | Admitting: Pulmonary Disease

## 2023-09-01 DIAGNOSIS — R911 Solitary pulmonary nodule: Secondary | ICD-10-CM | POA: Insufficient documentation

## 2023-09-01 DIAGNOSIS — J439 Emphysema, unspecified: Secondary | ICD-10-CM | POA: Diagnosis not present

## 2023-09-01 DIAGNOSIS — I7143 Infrarenal abdominal aortic aneurysm, without rupture: Secondary | ICD-10-CM | POA: Diagnosis not present

## 2023-09-02 ENCOUNTER — Encounter: Payer: Self-pay | Admitting: Urgent Care

## 2023-09-02 NOTE — Progress Notes (Signed)
 Perioperative / Anesthesia Services  Pre-Admission Testing Clinical Review / Pre-Operative Anesthesia Consult  Date: 09/02/23  PATIENT DEMOGRAPHICS: Name: Lisa Wilkerson DOB: [birth year] MRN:   [id number]  Note: Available PAT nursing documentation and vital signs have been reviewed. Clinical nursing staff has updated patient's PMH/PSHx, current medication list, and drug allergies/intolerances to ensure complete and comprehensive history available to assist care teams in MDM as it pertains to the aforementioned surgical procedure and anticipated anesthetic course. Extensive review of available clinical information personally performed. [facility] and P[facility] updated with any diagnoses/procedures that  may have been inadvertently omitted during his intake with the pre-admission testing department's nursing staff.  PLANNED SURGICAL PROCEDURE(S):    Case: [id number] Date/Time: 09/03/23 1217   Procedures:      VIDEO BRONCHOSCOPY WITH ENDOBRONCHIAL NAVIGATION     BRONCHOSCOPY, WITH EBUS   Anesthesia type: General   Diagnosis: Lung nodule [R91.1]   Pre-op diagnosis: R91.1, Lung Nodule   Location: [facility] PROCEDURE RM 02 / [facility] ORS FOR ANESTHESIA GROUP   Surgeons: Erskin Hearing, MD     CLINICAL DISCUSSION: Lisa Wilkerson is a 78 y.o. female who is submitted for pre-surgical anesthesia review and clearance prior to her undergoing the above procedure. Patient is a Current Smoker (59 pack years). Pertinent PMH includes: CAD, NSTEMI, AAA, TIA, PVD, carotid artery disease (s/p LEFT CEA), LEFT anterior communicating artery aneurysm, aortic atherosclerosis, HTN, HLD, CKD-III, OSAH (s/p UPPP), LEFT upper lobe pulmonary nodule, OA, lumbosacral DDD.  Patient is followed by cardiology (Gollan, MD). She was last seen in the cardiology clinic on 03/15/2019 for; notes reviewed. At the time of her clinic visit, patient doing well overall from a cardiovascular perspective.  Patient with chronic exertional dyspnea  reportedly stable and at baseline.  Patient continues to smoke.  Reported periodic indigestion associated with eating; resolves with eructation.  Patient denied any chest pain, PND, orthopnea, palpitations, significant peripheral edema, weakness, fatigue, vertiginous symptoms, or presyncope/syncope. Patient with a past medical history significant for cardiovascular diagnoses. Documented physical exam was grossly benign, providing no evidence of acute exacerbation and/or decompensation of the patient's known cardiovascular conditions.  Patient suffered an NSTEMI on 07/11/2009.  She underwent diagnostic LEFT heart catheterization on 07/12/2009 here at [facility] with Dr. Percival Brace.  Study demonstrated multivessel coronary artery disease; 40% mid LAD, 30% distal LAD, 30% proximal LCx, 50% mid LCx, 60% proximal RCA, 30% distal RCA, 50% RPDA, and 40% RPLS.  Given the nonobstructive nature of the observed coronary disease, the decision was made to defer intervention opting for medical management.  Patient suffered a TIA on 0403 in 2015.  CT imaging of the brain revealed no acute intracranial abnormalities. 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 Patient has no lasting symptoms following neurological event.   Most recent TTE performed on 02/08/2018 revealed a normal left ventricular systolic function with a hyperdynamic LVEF of 65 to 70%. There were no regional wall motion abnormalities. Right ventricular size and function norma. There was trivial aortic valve regurgitation. All transvalvular gradients were noted to be normal providing no evidence suggestive of valvular stenosis. Aorta normal in size with no evidence of ectasia or aneurysmal dilatation.  Patient with a history of a known infrarenal AAA. Most recent imaging was performed via chest CT on 06/14/2023, at which time there was  interval increase in size from 4.2 to 4.6 cm.  Again,  patient has been seen in consult by vascular surgery.  Per cardiology and vascular surgery, patient is not a candidate for endovascular repair.  Given patient's cardiovascular and peripheral vascular disease, patient is on daily DAPT therapy using ASA + clopidogrel .  Patient reportedly compliant with therapy with no evidence reports of GI/GU related bleeding. Blood pressure reasonably controlled at 130/62 mmHg on currently prescribed CCB (amlodipine ), nitrate (isosorbide  mononitrate), and ACEi (lisinopril ) therapies.  In addition to her scheduled nitrate therapy,   patient has a supply of short acting nitrates (NTG) to use on a as needed basis for recurrent angina/anginal equivalent symptoms; denied recent use.  Patient is on atorvastatin  for her HLD diagnosis and further ASCVD prevention.  She is not diabetic.  Patient does have a history of OSAH, however following UPPP procedure she no longer requires the use of nocturnal PAP therapy.  Functional capacity somewhat limited by patient's age and multiple medical comorbidities.  With that said, patient able to complete all of her ADLs/IADLs without significant cardiovascular limitation.  Per the DASI, patient felt to be able to achieve at least 4 METS physical activity without experiencing any significant degrees of angina/anginal symptoms. No changes were made to her medication regimen during her visit with cardiology.  Patient scheduled to follow-up with outpatient cardiology in 1 year or sooner if needed.  Lisa Wilkerson has undergone CT imaging of the chest reports BILATERAL pulmonary nodules.  Low-dose CT scan performed annually for cancer screening purposes.  CT on 09/17/2022 revealed associated paratracheal adenopathy measuring up to 14 mm on the RIGHT.  Subsequent PET imaging on 10/13/2022 demonstrated a 16mm subsolid nodule in the LEFT upper lobe with an 8 mm solid component.  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  Repeat CT imaging performed on 06/14/2023 demonstrated grossly unchanged nodule and paratracheal lymphadenopathy.  Patient was referred to pulmonary medicine for further evaluation and discussions regarding tissue sampling for diagnostic purposes.  Patient has been scheduled for VIDEO BRONCHOSCOPY WITH ENDOBRONCHIAL NAVIGATION BRONCHOSCOPY, WITH EBUS on 09/03/2023 with Dr. Erskin Hearing, MD.  Given patient's past medical history significant for cardiovascular diagnoses, presurgical cardiac clearance was sought by the PAT team. Per cardiology, "this patient is optimized for surgery and may proceed with the planned procedural course with a LOW risk of significant perioperative cardiovascular complications".  Again, this patient is on daily DAPT therapy.  She has been instructed on recommendations from her cardiologist and pulmonary medicine provider for holding her daily low-dose ASA and comparable doses for 5 days prior to her procedure with plans to restart it soon as postoperative bleeding was probably minimized by her primary attending surgeon.  Patient is aware that her last dose of her DAPT medications should be on 08/28/2023.  Patient denies previous perioperative complications with anesthesia in the past. In review her EMR, it is noted that patient underwent a general anesthetic course here at [facility] (ASA III) in 06/2022 without documented complications.   MOST RECENT VITAL SIGNS:    08/26/2023    2:59 PM 06/24/2023    1:30 PM 03/15/2023   11:19 AM  Vitals with BMI  Height 5\' 2"   5\' 2"   Weight 103 lbs 103 lbs 6 oz 105 lbs 13 oz  BMI 18.83  19.35  Systolic  172 130  Diastolic  92 62  Pulse  82 69   PROVIDERS/SPECIALISTS: NOTE: Primary physician provider  listed below. Patient may have been seen by APP or partner within same  practice.   PROVIDER ROLE / SPECIALTY LAST Lisa Halsted, MD Pulmonary Medicine (Surgeon) 08/16/2023   Lisa Ano, MD Primary Care Provider 05/07/2023 6  Lisa Boyden, MD Cardiology 12/25/2022; preop APP call 08/26/2023 6  Lisa Chalet, MD Nephrology 06/21/2023  Lisa Langdon, MD Radiation Oncology 06/24/2023   ALLERGIES: Allergies  Allergen Reactions   Codeine Other (See Comments)    Other: She states it paralyzes her and she can't move.   CURRENT HOME MEDICATIONS: No current facility-administered medications for this encounter.    albuterol  (PROVENTIL  HFA) 108 (90 Base) MCG/ACT inhaler   amLODipine  (NORVASC ) 2.5 MG tablet   amoxicillin -clavulanate (AUGMENTIN ) 875-125 MG tablet   aspirin  81 MG tablet   atorvastatin  (LIPITOR) 80 MG tablet   Cholecalciferol 25 MCG (1000 UT) tablet   clobetasol  ointment (TEMOVATE ) 0.05 %   clopidogrel  (PLAVIX ) 75 MG tablet   clotrimazole -betamethasone  (LOTRISONE ) cream   fluticasone -salmeterol (ADVAIR  HFA) 230-21 MCG/ACT inhaler   ipratropium-albuterol  (DUONEB) 0.5-2.5 (3) MG/3ML SOLN   isosorbide  mononitrate (IMDUR ) 30 MG 24 hr tablet   lisinopril  (ZESTRIL ) 5 MG tablet   mirtazapine  (REMERON ) 15 MG tablet   Risankizumab -rzaa (SKYRIZI  PEN) 150 MG/ML SOAJ   HISTORY: Past Medical History:  Diagnosis Date   AAA (abdominal aortic aneurysm) without rupture (HCC)    a.) not candidate for endovascular repair per cardiology/vascular   Actinic keratosis 10/29/2014   L hand thenar - biopsy proven   Actinic keratosis 08/17/2017   L forearm - biopsy proven   Aortic atherosclerosis (HCC)    Arthritis    Basal cell carcinoma of skin    Carotid stenosis    a.) s/p LEFT CEA 11/28/2015   CKD (chronic kidney disease), stage III (HCC)    COPD (chronic obstructive pulmonary disease) (HCC)    Coronary artery disease    COVID 05/2023   Full dentures    History of bilateral cataract extraction    Hyperlipidemia    Hypertension     Left upper lobe pulmonary nodule    Long-term use of aspirin  therapy    NSTEMI (non-ST elevated myocardial infarction) (HCC) 07/2009   Peripheral vascular disease (HCC)    Psoriasis    Sleep apnea    a.) does not require nocturnal PAP therapy s/p UPPP   Squamous cell carcinoma of skin 07/13/2017   L hand dorsum SCCIS   Squamous cell carcinoma of skin 11/23/2017   L mid forearm    Squamous cell carcinoma of skin 01/07/2022   Right Shoulder - Posterior, EDC   Squamous cell skin cancer 06/18/2014   L lat lower leg below the knee   TIA (transient ischemic attack) 2014   Vertigo    Past Surgical History:  Procedure Laterality Date   CATARACT EXTRACTION W/ INTRAOCULAR LENS IMPLANT Bilateral    ENDARTERECTOMY Left 11/28/2015   Procedure: ENDARTERECTOMY CAROTID;  Surgeon: Celso College, MD;  Location: [facility] ORS;  Service: Vascular;  Laterality: Left;   LEFT HEART CATH AND CORONARY ANGIOGRAPHY Left 07/12/2009   Procedure: LEFT HEART CATH AND CORONARY ANGIOGRAPHY; Location: [facility]; Surgeon: Percival Brace. MD   MYRINGOTOMY WITH TUBE PLACEMENT Bilateral 08/19/2016   Procedure: MYRINGOTOMY WITH TUBE PLACEMENT;  Surgeon: Rogers Clayman, MD;  Location: [facility];  Service: ENT;  Laterality: Bilateral;  sleep apnea   MYRINGOTOMY WITH TUBE PLACEMENT Bilateral 08/04/2017   Procedure: MYRINGOTOMY WITH TUBE PLACEMENT  BUTTERFLY TUBES;  Surgeon: Rogers Clayman, MD;  Location: [facility];  Service: ENT;  Laterality: Bilateral;   MYRINGOTOMY WITH TUBE PLACEMENT Bilateral 06/24/2022   Procedure: MYRINGOTOMY WITH TUBE PLACEMENT;  Surgeon: Rogers Clayman, MD;  Location: [facility];  Service: ENT;  Laterality: Bilateral;   UVULOPALATOPHARYNGOPLASTY  2000   No family history on file. Social History   Tobacco Use   Smoking status: Every Day    Current packs/day: 0.25    Average packs/day: 1 pack/day for 61.4 years (59.6 ttl pk-yrs)    Types: Cigarettes     Start date: 2023   Smokeless tobacco: Never  Substance Use Topics   Alcohol use: No   LABS:    Component Ref Range & Units 05/31/2023  WBC 3.8 - 10.8 Thousand/uL 10.1  RBC 3.80 - 5.10 Million/uL 4.36  Hemoglobin 11.7 - 15.5 g/dL 16.1  Hematocrit 09.6 - 45.0 % 38.8  MCV 80.0 - 100.0 fL 89  MCH 27.0 - 33.0 pg 28.9  MCHC 32.0 - 36.0 g/dL 04.5  RDW 40.9 - 81.1 % 14.6  Platelets 140 - 400 Thousand/uL 233  MPV 7.5 - 12.5 fL 10.6  Neutrophils Absolute 1500 - 7800 cells/uL 6,060  Lymphocytes Absolute 850 - 3900 cells/uL 3,262  Monocytes Absolute 200 - 950 cells/uL 636  Eosinophils Absolute 15 - 500 cells/uL 81  Basophils Absolute 0 - 200 cells/uL 61  Neutrophils Relative % 60  Lymphocytes % 32.3  Monocytes % 6.3  Eosinophils % 0.8  Basophils Relative % 0.6   Component Ref Range & Units 05/31/2023  Glucose 65 - 99 mg/dL 97  BUN 7 - 25 mg/dL 35 High   Creatinine 9.14 - 1.00 mg/dL 7.82 High   eGFR CKD-EPI CR 2021 > OR = 60 mL/min/1.76m2 34 Low   BUN/Creatinine Ratio 6 - 22 (calc) 22  Sodium 135 - 146 mmol/L 140  Potassium 3.5 - 5.3 mmol/L 4.8  Chloride 98 - 110 mmol/L 102  Bicarbonate (CO2) 20 - 32 mmol/L 29  Calcium  8.6 - 10.4 mg/dL 9.4  Phosphorus 2.1 - 4.3 mg/dL 4  Albumin 3.6 - 5.1 g/dL 3.5 Low     ECG: Date: 03/15/2023  Time ECG obtained: 1128 AM Rate: 69 bpm Rhythm: normal sinus Axis (leads I and aVF): normal Intervals: PR 176 ms. QRS 86 ms. QTc 432 ms. ST segment and T wave changes: No evidence of acute T wave abnormalities or significant ST segment elevation or depression.  Evidence of a possible, age undetermined, prior infarct:  No Comparison: Similar to previous tracing obtained on 08/17/2022   IMAGING / PROCEDURES: CT CHEST W CONTRAST performed on 06/14/2023 No substantial change in the mixed attenuation left upper lobe pulmonary nodule. Adenocarcinoma remains a concern. Abdominal aortic aneurysm measures up to 4.6 cm  diameter, increased from 4.2 cm on previous PET-CT. Recommend follow-up CT or MR as appropriate in 12 months and referral to or continued carewith vascular specialist. (Ref.: J Vasc Surg. 2018; 67:2-77 and J Am Coll Radiol 2013;10(10):789-794.) Emphysema  Aortic atherosclerosis   NM PET IMAGE INITIAL (PI) SKULL BASE TO THIGH performed on 10/13/2022 16 mm subsolid nodule in the left upper lobe with 8 mm solid component, with only minimal hypermetabolism, but suspicious for semi invasive adenocarcinoma. No findings suspicious for metastatic disease. 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 4.2 cm infrarenal abdominal aortic aneurysm. Recommend follow-up ultrasound every 12 months and vascular consultation. Aortic atherosclerosis  Emphysema   VAS US   CAROTID performed on 08/06/2022 Velocities in the right ICA are consistent with a 1-39% stenosis. Non-hemodynamically significant plaque <50% noted in the CCA.  Velocities in the left ICA are consistent with a 1-39% stenosis. Hemodynamically significant plaque >50% visualized in the CCA.  Bilateral vertebral arteries demonstrate antegrade flow.  Normal flow hemodynamics were seen in bilateral subclavian arteries.   TRANSTHORACIC ECHOCARDIOGRAM performed on 02/08/2018 Normal left ventricular systolic function with a hyperdynamic LVEF of 65 to 70%. No regional wall motion abnormalities Right ventricular size and function normal Trivial aortic valve regurgitation Normal gradients; no valvular stenosis    IMPRESSION AND PLAN: ADYSEN RAPHAEL has been referred for pre-anesthesia review and clearance prior to her undergoing the planned anesthetic and procedural courses. Available labs, pertinent testing, and imaging results were personally reviewed by me in preparation for upcoming operative/procedural course. [facility] medical record has been updated following extensive record review and patient  interview with PAT staff.   This patient has been appropriately cleared by cardiology with an overall ACCEPTABLE risk of patient experiencing significant perioperative cardiovascular complications. Based on clinical review performed today (09/02/23), barring any significant acute changes in the patient's overall condition, it is anticipated that she will be able to proceed with the planned surgical intervention. Any acute changes in clinical condition may necessitate her procedure being postponed and/or cancelled. Patient will meet with anesthesia team (MD and/or CRNA) on the day of her procedure for preoperative evaluation/assessment. Questions regarding anesthetic course will be fielded at that time.   Pre-surgical instructions were reviewed with the patient during his PAT appointment, and questions were fielded to satisfaction by PAT clinical staff. She has been instructed on which medications that she will need to hold prior to surgery, as well as the ones that have been deemed safe/appropriate to take on the day of her procedure. As part of the general education provided by PAT, patient made aware both verbally and in writing, that she would need to abstain from the use of any illegal substances during her perioperative course. She was advised that failure to follow the provided instructions could necessitate case cancellation or result in serious perioperative complications up to and including death. Patient encouraged to contact PAT and/or her surgeon's office to discuss any questions or concerns that may arise prior to surgery; verbalized understanding.   Renate Caroline, MSN, APRN, FNP-C, CEN [facility]  Perioperative Services Nurse Practitioner Phone: [phone] Fax: [phone] 09/02/23 1:07 PM  NOTE: This note has been prepared using Dragon dictation software. Despite my best ability to proofread, there is always the potential that unintentional transcriptional errors  may still occur from this process.

## 2023-09-03 ENCOUNTER — Encounter
Admission: RE | Disposition: A | Discharge status: Home / Self Care | Payer: Self-pay | Source: Home / Self Care | Attending: Pulmonary Disease

## 2023-09-03 ENCOUNTER — Ambulatory Visit

## 2023-09-03 ENCOUNTER — Ambulatory Visit: Payer: Self-pay | Admitting: Urgent Care

## 2023-09-03 ENCOUNTER — Other Ambulatory Visit: Payer: Self-pay

## 2023-09-03 ENCOUNTER — Ambulatory Visit
Admission: RE | Admit: 2023-09-03 | Discharge: 2023-09-03 | Disposition: A | Discharge status: Home / Self Care | Attending: Pulmonary Disease | Admitting: Pulmonary Disease

## 2023-09-03 DIAGNOSIS — N183 Chronic kidney disease, stage 3 unspecified: Secondary | ICD-10-CM | POA: Diagnosis not present

## 2023-09-03 DIAGNOSIS — R590 Localized enlarged lymph nodes: Secondary | ICD-10-CM | POA: Insufficient documentation

## 2023-09-03 DIAGNOSIS — I129 Hypertensive chronic kidney disease with stage 1 through stage 4 chronic kidney disease, or unspecified chronic kidney disease: Secondary | ICD-10-CM | POA: Insufficient documentation

## 2023-09-03 DIAGNOSIS — C3412 Malignant neoplasm of upper lobe, left bronchus or lung: Secondary | ICD-10-CM | POA: Diagnosis not present

## 2023-09-03 DIAGNOSIS — I252 Old myocardial infarction: Secondary | ICD-10-CM | POA: Diagnosis not present

## 2023-09-03 DIAGNOSIS — Z8679 Personal history of other diseases of the circulatory system: Secondary | ICD-10-CM | POA: Diagnosis not present

## 2023-09-03 DIAGNOSIS — M1990 Unspecified osteoarthritis, unspecified site: Secondary | ICD-10-CM | POA: Insufficient documentation

## 2023-09-03 DIAGNOSIS — E785 Hyperlipidemia, unspecified: Secondary | ICD-10-CM | POA: Insufficient documentation

## 2023-09-03 DIAGNOSIS — F17210 Nicotine dependence, cigarettes, uncomplicated: Secondary | ICD-10-CM | POA: Diagnosis not present

## 2023-09-03 DIAGNOSIS — G4730 Sleep apnea, unspecified: Secondary | ICD-10-CM | POA: Diagnosis not present

## 2023-09-03 DIAGNOSIS — Z8673 Personal history of transient ischemic attack (TIA), and cerebral infarction without residual deficits: Secondary | ICD-10-CM | POA: Diagnosis not present

## 2023-09-03 DIAGNOSIS — R918 Other nonspecific abnormal finding of lung field: Secondary | ICD-10-CM | POA: Diagnosis not present

## 2023-09-03 DIAGNOSIS — I739 Peripheral vascular disease, unspecified: Secondary | ICD-10-CM | POA: Diagnosis not present

## 2023-09-03 DIAGNOSIS — Z48813 Encounter for surgical aftercare following surgery on the respiratory system: Secondary | ICD-10-CM | POA: Diagnosis not present

## 2023-09-03 DIAGNOSIS — J449 Chronic obstructive pulmonary disease, unspecified: Secondary | ICD-10-CM | POA: Insufficient documentation

## 2023-09-03 DIAGNOSIS — R911 Solitary pulmonary nodule: Secondary | ICD-10-CM | POA: Diagnosis not present

## 2023-09-03 DIAGNOSIS — I2510 Atherosclerotic heart disease of native coronary artery without angina pectoris: Secondary | ICD-10-CM | POA: Insufficient documentation

## 2023-09-03 DIAGNOSIS — I517 Cardiomegaly: Secondary | ICD-10-CM | POA: Diagnosis not present

## 2023-09-03 DIAGNOSIS — I10 Essential (primary) hypertension: Secondary | ICD-10-CM | POA: Diagnosis not present

## 2023-09-03 DIAGNOSIS — J984 Other disorders of lung: Secondary | ICD-10-CM | POA: Diagnosis not present

## 2023-09-03 HISTORY — DX: Atherosclerosis of aorta: I70.0

## 2023-09-03 HISTORY — DX: Cerebral aneurysm, nonruptured: I67.1

## 2023-09-03 HISTORY — PX: VIDEO BRONCHOSCOPY WITH ENDOBRONCHIAL NAVIGATION: SHX6175

## 2023-09-03 HISTORY — DX: Other intervertebral disc degeneration, lumbosacral region without mention of lumbar back pain or lower extremity pain: M51.379

## 2023-09-03 HISTORY — DX: Long term (current) use of aspirin: Z79.82

## 2023-09-03 HISTORY — DX: Cataract extraction status, right eye: Z98.41

## 2023-09-03 HISTORY — DX: Solitary pulmonary nodule: R91.1

## 2023-09-03 HISTORY — DX: Benign neoplasm of major salivary gland, unspecified: D11.9

## 2023-09-03 HISTORY — PX: VIDEO BRONCHOSCOPY WITH ENDOBRONCHIAL ULTRASOUND: SHX6177

## 2023-09-03 SURGERY — VIDEO BRONCHOSCOPY WITH ENDOBRONCHIAL NAVIGATION
Anesthesia: General

## 2023-09-03 MED ORDER — LIDOCAINE HCL (CARDIAC) PF 100 MG/5ML IV SOSY
PREFILLED_SYRINGE | INTRAVENOUS | Status: DC | PRN
Start: 2023-09-03 — End: 2023-09-03
  Administered 2023-09-03: 100 mg via INTRAVENOUS

## 2023-09-03 MED ORDER — ROCURONIUM BROMIDE 10 MG/ML (PF) SYRINGE
PREFILLED_SYRINGE | INTRAVENOUS | Status: AC
Start: 2023-09-03 — End: ?
  Filled 2023-09-03: qty 10

## 2023-09-03 MED ORDER — CHLORHEXIDINE GLUCONATE 0.12 % MT SOLN: 15.0000 mL | Freq: Once | OROMUCOSAL | Status: DC

## 2023-09-03 MED ORDER — DEXAMETHASONE SODIUM PHOSPHATE 10 MG/ML IJ SOLN
INTRAMUSCULAR | Status: AC
  Filled 2023-09-03: qty 1

## 2023-09-03 MED ORDER — PHENYLEPHRINE 80 MCG/ML (10ML) SYRINGE FOR IV PUSH (FOR BLOOD PRESSURE SUPPORT)
PREFILLED_SYRINGE | INTRAVENOUS | Status: DC | PRN
  Administered 2023-09-03 (×2): 80 ug via INTRAVENOUS

## 2023-09-03 MED ORDER — FENTANYL CITRATE (PF) 100 MCG/2ML IJ SOLN
INTRAMUSCULAR | Status: DC | PRN
  Administered 2023-09-03: 50 ug via INTRAVENOUS

## 2023-09-03 MED ORDER — EPHEDRINE SULFATE-NACL 50-0.9 MG/10ML-% IV SOSY
PREFILLED_SYRINGE | INTRAVENOUS | Status: DC | PRN
Start: 2023-09-03 — End: 2023-09-03
  Administered 2023-09-03 (×2): 5 mg via INTRAVENOUS

## 2023-09-03 MED ORDER — PROPOFOL 500 MG/50ML IV EMUL
INTRAVENOUS | Status: DC | PRN
  Administered 2023-09-03: 70 ug/kg/min via INTRAVENOUS
  Administered 2023-09-03: 150 ug/kg/min via INTRAVENOUS

## 2023-09-03 MED ORDER — LIDOCAINE HCL (PF) 2 % IJ SOLN
INTRAMUSCULAR | Status: AC
  Filled 2023-09-03: qty 20

## 2023-09-03 MED ORDER — SUGAMMADEX SODIUM 200 MG/2ML IV SOLN
INTRAVENOUS | Status: DC | PRN
Start: 2023-09-03 — End: 2023-09-03
  Administered 2023-09-03: 200 mg via INTRAVENOUS

## 2023-09-03 MED ORDER — LACTATED RINGERS IV SOLN: INTRAVENOUS | Status: DC

## 2023-09-03 MED ORDER — MIDAZOLAM HCL 2 MG/2ML IJ SOLN
INTRAMUSCULAR | Status: AC
Start: 2023-09-03 — End: ?
  Filled 2023-09-03: qty 2

## 2023-09-03 MED ORDER — ONDANSETRON HCL 4 MG/2ML IJ SOLN
INTRAMUSCULAR | Status: DC | PRN
  Administered 2023-09-03: 4 mg via INTRAVENOUS

## 2023-09-03 MED ORDER — DEXAMETHASONE SODIUM PHOSPHATE 10 MG/ML IJ SOLN
INTRAMUSCULAR | Status: DC | PRN
  Administered 2023-09-03: 10 mg via INTRAVENOUS

## 2023-09-03 MED ORDER — LIDOCAINE HCL 4 % MT SOLN
OROMUCOSAL | Status: DC | PRN
Start: 2023-09-03 — End: 2023-09-03
  Administered 2023-09-03: 4 mL via TOPICAL

## 2023-09-03 MED ORDER — PROPOFOL 10 MG/ML IV BOLUS: INTRAVENOUS | Status: DC | PRN

## 2023-09-03 MED ORDER — ONDANSETRON HCL 4 MG/2ML IJ SOLN
INTRAMUSCULAR | Status: AC
Start: 2023-09-03 — End: ?
  Filled 2023-09-03: qty 2

## 2023-09-03 MED ORDER — ORAL CARE MOUTH RINSE: 15.0000 mL | Freq: Once | OROMUCOSAL | Status: DC

## 2023-09-03 MED ORDER — LIDOCAINE HCL (PF) 2 % IJ SOLN
INTRAMUSCULAR | Status: AC
  Filled 2023-09-03: qty 5

## 2023-09-03 MED ORDER — ROCURONIUM BROMIDE 100 MG/10ML IV SOLN
INTRAVENOUS | Status: DC | PRN
  Administered 2023-09-03: 30 mg via INTRAVENOUS
  Administered 2023-09-03: 20 mg via INTRAVENOUS

## 2023-09-03 MED ORDER — FENTANYL CITRATE (PF) 100 MCG/2ML IJ SOLN
INTRAMUSCULAR | Status: AC
  Filled 2023-09-03: qty 2

## 2023-09-03 NOTE — Procedures (Signed)
 ROBOTIC NAVIGATIONAL BRONCHOSCOPY WITH TRANSBRONCHIAL LUNG BIOPSY PROCEDURE NOTE   FIBEROPTIC BRONCHOSCOPY WITH THERAPEUTIC ASPIRATION OF TRACHEOBRONCHIAL TREE AND BRONCHOALVEOLAR LAVAGE PROCEDURE NOTE  ENDOBRONCHIAL ULTRASOUND >/= 1 LYMPH NODE PROCEDURE NOTE  BRONCHOSCOPY WITH FIDUCIAL MARKER PLACEMENT  Flexible bronchoscopy was performed  by : Jaclynn Mast MD  assistance by : 1)Repiratory therapist  and 2)[employer] staff and 3) Anesthesia team and 4) Flouroscopy team and 5) [employer] supporting staff   Indication for the procedure was :  Pre-procedural H&P. The following assessment was performed on the day of the procedure prior to initiating sedation History:  Chest pain n Dyspnea y Hemoptysis n Cough y Fever n Other pertinent items n  Examination Vital signs -reviewed as per nursing documentation today Cardiac    Murmurs: n  Rubs : n  Gallop: n Lungs Wheezing: n Rales : n Rhonchi :y  Other pertinent findings: SOB/hypoxemia due to chronic lung disease   Pre-procedural assessment for Procedural Sedation included: Depth of sedation: As per anesthesia team  ASA Classification:  2 Mallampati airway assessment: 3    Medication list reviewed: y  The patient's interval history was taken and revealed: no new complaints The pre- procedure physical examination revealed: No new findings Refer to prior clinic note for details.  Informed Consent: Informed consent was obtained from:  patient after explanation of procedure and risks, benefits, as well as alternative procedures available.  Explanation of level of sedation and possible transfusion was also provided.    Procedural Preparation: Time out was performed and patient was identified by name and birthdate and procedure to be performed and side for sampling, if any, was specified. Pt was intubated by anesthesia.  The patient was appropriately draped.   Fiberoptic bronchoscopy with airway inspection, therapeutic aspiration  of tracheobronchial tree and BAL Procedure findings:  Bronchoscope was inserted via ETT  without difficulty.  Posterior oropharynx, epiglottis, arytenoids, false cords and vocal cords were not visualized as these were bypassed by endotracheal tube.Bilateral thick mucus plugging was noted and evacuated via therapeutic aspiration of tracheobronchial tree.  The mucosa was : friable at LUL  Airways were notable for:        exophytic lesions :n       extrinsic compression in the following distributions: n.       Friable mucosa: y       Teacher, music /pigmentation: n     Post procedure Diagnosis:   mucus plugging of bilateral airways cleared with multiple therapeutic aspirations     Robotic Ion Navigational Bronchoscopy Procedure Findings:   Post appropriate planning and registration peripheral navigation was used to visualize target lesion.    TARGET #1 - TRANBROCHIAL BIOPSY X4 BAL X 1 AT LEFT UPPER LOBE    Media Information  Document Information  DIRECT VISUAL CONFIRMATION OF LUL LESION    Media Information  Document Information  FIDUCIAL MARKERS X 2 PLACED AT SUBSOLID LUL LESION   Post procedure diagnosis: LUNG CANCER       Endobronchial ultrasound assisted hilar and mediastinal lymph node biopsies procedure findings: The fiberoptic bronchoscope was removed and the EBUS scope was introduced. Examination began to evaluate for pathologically enlarged lymph nodes starting on the RIGHT  side progressing to the LEFT side.  All lymph node biopsies performed with 21G OLYMPUS needle. Lymph node biopsies were sent in cytolite for all stations.  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   Total dosage of Lidocaine  was NONE mg Total fluoroscopy time was AS PER RADIOLOGY  minutes  Supplemental oxygen was provided at AS PER ANESTHESIA lpm by nasal canula post operatively  Estimated Blood loss: EXPECTED <5cc.  Complications included:  NONE IMMEDIATE   Preliminary CXR findings :  IN PROCESS   Disposition: HOME WITH FAMILY  Follow up with Dr. Kyri Shader in 5 days for result discussion.     Erskin Hearing MD  [facility] Division of Pulmonary & Critical Care Medicine

## 2023-09-03 NOTE — Anesthesia Postprocedure Evaluation (Signed)
 Anesthesia Post Note  Patient: Lisa Wilkerson  Procedure(s) Performed: VIDEO BRONCHOSCOPY WITH ENDOBRONCHIAL NAVIGATION BRONCHOSCOPY, WITH EBUS  Patient location during evaluation: PACU Anesthesia Type: General Level of consciousness: awake and alert, oriented and patient cooperative Pain management: pain level controlled Vital Signs Assessment: post-procedure vital signs reviewed and stable Respiratory status: spontaneous breathing, nonlabored ventilation and respiratory function stable Cardiovascular status: blood pressure returned to baseline and stable Postop Assessment: adequate PO intake Anesthetic complications: no   There were no known notable events for this encounter.   Last Vitals:  Vitals:   09/03/23 1445 09/03/23 1500  BP: (!) 119/59 116/63  Pulse: 63 60  Resp: 17 14  Temp:  (!) 36.4 C  SpO2: 96% 96%    Last Pain:  Vitals:   09/03/23 1500  PainSc: 0-No pain                 Dorothey Gate

## 2023-09-03 NOTE — Anesthesia Procedure Notes (Signed)
 Procedure Name: Intubation Date/Time: 09/03/2023 1:27 PM  Performed by: Simona Dublin, CRNAPre-anesthesia Checklist: Patient identified, Emergency Drugs available, Suction available and Patient being monitored Patient Re-evaluated:Patient Re-evaluated prior to induction Oxygen Delivery Method: Circle system utilized Preoxygenation: Pre-oxygenation with 100% oxygen Induction Type: IV induction Ventilation: Two handed mask ventilation required Laryngoscope Size: McGrath and 3 Grade View: Grade I Tube type: Oral Tube size: 8.0 mm Number of attempts: 1 Airway Equipment and Method: Stylet and Video-laryngoscopy Placement Confirmation: positive ETCO2, breath sounds checked- equal and bilateral and ETT inserted through vocal cords under direct vision Secured at: 22 cm Tube secured with: Tape Dental Injury: Teeth and Oropharynx as per pre-operative assessment

## 2023-09-03 NOTE — Transfer of Care (Signed)
 Immediate Anesthesia Transfer of Care Note  Patient: Lisa Wilkerson  Procedure(s) Performed: VIDEO BRONCHOSCOPY WITH ENDOBRONCHIAL NAVIGATION BRONCHOSCOPY, WITH EBUS  Patient Location: PACU  Anesthesia Type:General  Level of Consciousness: awake  Airway & Oxygen Therapy: Patient Spontanous Breathing  Post-op Assessment: Report given to RN and Post -op Vital signs reviewed and stable  Post vital signs: Reviewed and stable  Last Vitals:  Vitals Value Taken Time  BP 114/56 09/03/23 1430  Temp 36.1 C 09/03/23 1424  Pulse 64 09/03/23 1431  Resp 16 09/03/23 1431  SpO2 100 % 09/03/23 1431  Vitals shown include unfiled device data.  Last Pain:  Vitals:   09/03/23 1430  PainSc: Asleep         Complications: There were no known notable events for this encounter.

## 2023-09-03 NOTE — Anesthesia Preprocedure Evaluation (Signed)
 Anesthesia Evaluation  Patient identified by MRN, date of birth, ID band Patient awake    Reviewed: Allergy & Precautions, NPO status , Patient's Chart, lab work & pertinent test results  History of Anesthesia Complications Negative for: history of anesthetic complications  Airway Mallampati: III  TM Distance: <3 FB Neck ROM: full    Dental  (+) Edentulous Upper, Edentulous Lower   Pulmonary neg shortness of breath, sleep apnea , COPD, Current Smoker and Patient abstained from smoking. Left upper lobe pulmonary nodule   Pulmonary exam normal        Cardiovascular Exercise Tolerance: Good hypertension, + CAD, + Past MI (Prior NSTEMI no stent) and + Peripheral Vascular Disease (s/p LEFT CEA 11/28/2015)  Normal cardiovascular exam  Patient with a history of a known infrarenal AAA. Most recent imaging was performed via chest CT on 06/14/2023, at which time there was interval increase in size from 4.2 to 4.6 cm.  Again, patient has been seen in consult by vascular surgery.  Per cardiology and vascular surgery, patient is not a candidate for endovascular repair.  ECHO 2019: Study Conclusions  - Left ventricle: The cavity size was normal. Systolic function was    vigorous. The estimated ejection fraction was in the range of 65%    to 70%.  - Aortic valve: There was trivial regurgitation.     Neuro/Psych Anterior communicating artery aneurysm (LEFT)   TIA on 0403 in 2015.  CT imaging of the brain revealed no acute intracranial abnormalities. Follow-up MRI imaging on 07/08/2013 revealed a 3 mm ACA aneurysm and moderate to severe stenosis of the LEFT V4 segment TIA negative psych ROS   GI/Hepatic negative GI ROS, Neg liver ROS,,,  Endo/Other  negative endocrine ROS    Renal/GU Renal InsufficiencyRenal disease  negative genitourinary   Musculoskeletal  (+) Arthritis ,    Abdominal   Peds  Hematology negative hematology  ROS (+)   Anesthesia Other Findings Past Medical History: 10/29/2014: Actinic keratosis     Comment:  L hand thenar - biopsy proven 08/17/2017: Actinic keratosis     Comment:  L forearm - biopsy proven No date: Arthritis No date: Basal cell carcinoma of skin No date: Chronic kidney disease     Comment:  Stage 3 Chronic Kidney Disease No date: Coronary artery disease No date: Hyperlipidemia No date: Hypertension 07/2009: Myocardial infarction (HCC) No date: Peripheral vascular disease (HCC) No date: Psoriasis No date: Sleep apnea     Comment:  no CPAP 07/13/2017: Squamous cell carcinoma of skin     Comment:  L hand dorsum SCCIS 11/23/2017: Squamous cell carcinoma of skin     Comment:  L mid forearm  01/07/2022: Squamous cell carcinoma of skin     Comment:  Right Shoulder - Posterior, EDC 06/18/2014: Squamous cell skin cancer     Comment:  L lat lower leg below the knee 2014: TIA (transient ischemic attack)     Comment:  no deficits No date: Vertigo     Comment:  no issues for several years No date: Wears dentures     Comment:  full upper and lower  Past Surgical History: 2011: CARDIAC CATHETERIZATION 11/28/2015: ENDARTERECTOMY; Left     Comment:  Procedure: ENDARTERECTOMY CAROTID;  Surgeon: Celso College, MD;  Location: [facility];  Service: Vascular;                Laterality: Left; No  date: EYE SURGERY; Bilateral     Comment:  Cataract Extraction with IOL 08/19/2016: MYRINGOTOMY WITH TUBE PLACEMENT; Bilateral     Comment:  Procedure: MYRINGOTOMY WITH TUBE PLACEMENT;  Surgeon:               Rogers Clayman, MD;  Location: [facility];                Service: ENT;  Laterality: Bilateral;  sleep apnea 08/04/2017: MYRINGOTOMY WITH TUBE PLACEMENT; Bilateral     Comment:  Procedure: MYRINGOTOMY WITH TUBE PLACEMENT                         BUTTERFLY TUBES;  Surgeon: Rogers Clayman, MD;                Location: [facility];  Service: ENT;                 Laterality: Bilateral; 2000: UVULOPALATOPHARYNGOPLASTY  BMI    Body Mass Index: 20.42 kg/m      Reproductive/Obstetrics negative OB ROS                              Anesthesia Physical Anesthesia Plan  ASA: 3  Anesthesia Plan: General   Post-op Pain Management: Minimal or no pain anticipated   Induction: Intravenous  PONV Risk Score and Plan: Dexamethasone  and Ondansetron   Airway Management Planned: Oral ETT  Additional Equipment:   Intra-op Plan:   Post-operative Plan: Extubation in OR  Informed Consent:      Dental Advisory Given  Plan Discussed with: Anesthesiologist, CRNA and Surgeon  Anesthesia Plan Comments:         Anesthesia Quick Evaluation

## 2023-09-03 NOTE — H&P (Signed)
 PULMONOLOGY         Date: 09/03/2023,   MRN# [id number] Lisa Wilkerson March 16, 1946     AdmissionWeight: 45.4 kg                 CurrentWeight: 45.4 kg    CHIEF COMPLAINT:   Left upper lobe Sub-solid nodule   HISTORY OF PRESENT ILLNESS   This is a 78 yo pleasant female well known to me from pulmonary clinic. She has a history of lifelong smoking and actively doing so. She is fully independent and is able to perform all ADLs on her own.  She has a spiculated left upper lobe lung nodule.  Recently this nodule has developed more nodular subsolid component worrisome for adenocarcinoma.  Ive discussed this at length with patient and her 2 sons.  She wishes to have additional investigation to determine if there is definitive cancer there. She is worried about empiric SBRT due to residual low lung function and does not wish to undergo therapy unless it is absolutely necessary.  Today we plan to perform bronchoscopy with airway inspection, therapeutic aspiration of tracheobronchial tree, BAL, robotic assisted lung biopsy of LUL subsolid nodule, endobronchial US  for evaluation of hilar and mediastinal lymph nodes. Family present at bedside and we answered all additional questions. Reviewed risks/complications and benefits with patient, risks include infection, pneumothorax/pneumomediastinum which may require chest tube placement as well as overnight/prolonged hospitalization and possible mechanical ventilation. Other risks include bleeding and very rarely death.  Patient understands risks and wishes to proceed.  Additional questions were answered, and patient is aware that post procedure patient will be going home with family and may experience cough with possible clots on expectoration as well as phlegm which may last few days as well as hoarseness of voice post intubation and mechanical ventilation.    PAST MEDICAL HISTORY   Past Medical History:  Diagnosis Date   AAA (abdominal aortic  aneurysm) without rupture (HCC)    a.) not candidate for endovascular repair per cardiology/vascular   Actinic keratosis 10/29/2014   L hand thenar - biopsy proven   Actinic keratosis 08/17/2017   L forearm - biopsy proven   Anterior communicating artery aneurysm (LEFT)    Aortic atherosclerosis (HCC)    Arthritis    Basal cell carcinoma of skin    Carotid stenosis    a.) s/p LEFT CEA 11/28/2015   CKD (chronic kidney disease), stage III (HCC)    COPD (chronic obstructive pulmonary disease) (HCC)    Coronary artery disease    DDD (degenerative disc disease), lumbosacral    Full dentures    History of 2019 novel coronavirus disease (COVID-19) 05/31/2023   History of bilateral cataract extraction    Hyperlipidemia    Hypertension    Left upper lobe pulmonary nodule    Long-term use of aspirin  therapy    NSTEMI (non-ST elevated myocardial infarction) (HCC) 07/2009   Peripheral vascular disease (HCC)    Psoriasis    Sleep apnea    a.) does not require nocturnal PAP therapy s/p UPPP   Squamous cell carcinoma of skin 07/13/2017   L hand dorsum SCCIS   Squamous cell carcinoma of skin 11/23/2017   L mid forearm    Squamous cell carcinoma of skin 01/07/2022   Right Shoulder - Posterior, EDC   Squamous cell skin cancer 06/18/2014   L lat lower leg below the knee   TIA (transient ischemic attack) 2014   Vertigo  Warthin's tumor (LEFT)      SURGICAL HISTORY   Past Surgical History:  Procedure Laterality Date   CATARACT EXTRACTION W/ INTRAOCULAR LENS IMPLANT Bilateral    ENDARTERECTOMY Left 11/28/2015   Procedure: ENDARTERECTOMY CAROTID;  Surgeon: Celso College, MD;  Location: [facility];  Service: Vascular;  Laterality: Left;   LEFT HEART CATH AND CORONARY ANGIOGRAPHY Left 07/12/2009   Procedure: LEFT HEART CATH AND CORONARY ANGIOGRAPHY; Location: [facility]; Surgeon: Percival Brace. MD   MYRINGOTOMY WITH TUBE PLACEMENT Bilateral 08/19/2016   Procedure: MYRINGOTOMY WITH TUBE  PLACEMENT;  Surgeon: Rogers Clayman, MD;  Location: [facility];  Service: ENT;  Laterality: Bilateral;  sleep apnea   MYRINGOTOMY WITH TUBE PLACEMENT Bilateral 08/04/2017   Procedure: MYRINGOTOMY WITH TUBE PLACEMENT           BUTTERFLY TUBES;  Surgeon: Rogers Clayman, MD;  Location: [facility];  Service: ENT;  Laterality: Bilateral;   MYRINGOTOMY WITH TUBE PLACEMENT Bilateral 06/24/2022   Procedure: MYRINGOTOMY WITH TUBE PLACEMENT;  Surgeon: Rogers Clayman, MD;  Location: [facility];  Service: ENT;  Laterality: Bilateral;   UVULOPALATOPHARYNGOPLASTY  2000     FAMILY HISTORY   History reviewed. No pertinent family history.   SOCIAL HISTORY   Social History   Tobacco Use   Smoking status: Every Day    Current packs/day: 0.25    Average packs/day: 1 pack/day for 61.4 years (59.6 ttl pk-yrs)    Types: Cigarettes    Start date: 2023   Smokeless tobacco: Never  Vaping Use   Vaping status: Never Used  Substance Use Topics   Alcohol use: No   Drug use: No     MEDICATIONS    Home Medication:    Current Medication:  Current Facility-Administered Medications:    chlorhexidine  (PERIDEX ) 0.12 % solution 15 mL, 15 mL, Mouth/Throat, Once **OR** Oral care mouth rinse, 15 mL, Mouth Rinse, Once, Lattie Poli, MD   lactated ringers  infusion, , Intravenous, Continuous, Lattie Poli, MD    ALLERGIES   Codeine     REVIEW OF SYSTEMS    Review of Systems:  Gen:  Denies  fever, sweats, chills weigh loss  HEENT: Denies blurred vision, double vision, ear pain, eye pain, hearing loss, nose bleeds, sore throat Cardiac:  No dizziness, chest pain or heaviness, chest tightness,edema Resp:   reports dyspnea chronically  Gi: Denies swallowing difficulty, stomach pain, nausea or vomiting, diarrhea, constipation, bowel incontinence Gu:  Denies bladder incontinence, burning urine Ext:   Denies Joint pain, stiffness or swelling Skin: Denies  skin rash,  easy bruising or bleeding or hives Endoc:  Denies polyuria, polydipsia , polyphagia or weight change Psych:   Denies depression, insomnia or hallucinations   Other:  All other systems negative   VS: BP 97/62   Pulse 73   Temp 97.9 F (36.6 C)   Resp 16   Ht 5\' 2"  (1.575 m)   Wt 45.4 kg   SpO2 100%   BMI 18.29 kg/m      PHYSICAL EXAM    GENERAL:NAD, no fevers, chills, no weakness no fatigue HEAD: Normocephalic, atraumatic.  EYES: Pupils equal, round, reactive to light. Extraocular muscles intact. No scleral icterus.  MOUTH: Moist mucosal membrane. Dentition intact. No abscess noted.  EAR, NOSE, THROAT: Clear without exudates. No external lesions.  NECK: Supple. No thyromegaly. No nodules. No JVD.  PULMONARY: decreased breath sounds with mild rhonchi worse at bases bilaterally.  CARDIOVASCULAR: S1 and S2. Regular rate and rhythm. No murmurs,  rubs, or gallops. No edema. Pedal pulses 2+ bilaterally.  GASTROINTESTINAL: Soft, nontender, nondistended. No masses. Positive bowel sounds. No hepatosplenomegaly.  MUSCULOSKELETAL: No swelling, clubbing, or edema. Range of motion full in all extremities.  NEUROLOGIC: Cranial nerves II through XII are intact. No gross focal neurological deficits. Sensation intact. Reflexes intact.  SKIN: No ulceration, lesions, rashes, or cyanosis. Skin warm and dry. Turgor intact.  PSYCHIATRIC: Mood, affect within normal limits. The patient is awake, alert and oriented x 3. Insight, judgment intact.       IMAGING   CT chest - 09/01/23- Left upper lobe GGO with more nodular solid component worrisome for primary bronchogenic carcinoma  ASSESSMENT/PLAN   Left upper lobe sub-solid lung nodule      -plan for bronchoscopy with airway inspection followed by therapeutic aspiration of tracheobronchial tree and BAL followed by RANB of LUL with fiducial marker placement and EBUS with LN biopsies   -Reviewed risks/complications and benefits with patient, risks  include infection, pneumothorax/pneumomediastinum which may require chest tube placement as well as overnight/prolonged hospitalization and possible mechanical ventilation. Other risks include bleeding and very rarely death.  Patient understands risks and wishes to proceed.  Additional questions were answered, and patient is aware that post procedure patient will be going home with family and may experience cough with possible clots on expectoration as well as phlegm which may last few days as well as hoarseness of voice post intubation and mechanical ventilation.        Thank you for allowing me to participate in the care of this patient.   Patient/Family are satisfied with care plan and all questions have been answered.    Provider disclosure: Patient with at least one acute or chronic illness or injury that poses a threat to life or bodily function and is being managed actively during this encounter.  All of the below services have been performed independently by signing provider:  review of prior documentation from internal and or external health records.  Review of previous and current lab results.  Interview and comprehensive assessment during patient visit today. Review of current and previous chest radiographs/CT scans. Discussion of management and test interpretation with health care team and patient/family.   This document was prepared using Dragon voice recognition software and may include unintentional dictation errors.     Charlott Calvario, M.D.  Division of Pulmonary & Critical Care Medicine

## 2023-09-04 ENCOUNTER — Encounter: Payer: Self-pay | Admitting: Pulmonary Disease

## 2023-09-06 LAB — CYTOLOGY - NON PAP

## 2023-09-06 LAB — SURGICAL PATHOLOGY

## 2023-09-07 ENCOUNTER — Ambulatory Visit: Payer: HMO | Admitting: Dermatology

## 2023-09-07 ENCOUNTER — Other Ambulatory Visit: Payer: Self-pay | Admitting: Dermatology

## 2023-09-07 DIAGNOSIS — L821 Other seborrheic keratosis: Secondary | ICD-10-CM

## 2023-09-07 DIAGNOSIS — L578 Other skin changes due to chronic exposure to nonionizing radiation: Secondary | ICD-10-CM

## 2023-09-07 DIAGNOSIS — L409 Psoriasis, unspecified: Secondary | ICD-10-CM

## 2023-09-07 DIAGNOSIS — C44622 Squamous cell carcinoma of skin of right upper limb, including shoulder: Secondary | ICD-10-CM | POA: Diagnosis not present

## 2023-09-07 DIAGNOSIS — W908XXA Exposure to other nonionizing radiation, initial encounter: Secondary | ICD-10-CM | POA: Diagnosis not present

## 2023-09-07 DIAGNOSIS — D492 Neoplasm of unspecified behavior of bone, soft tissue, and skin: Secondary | ICD-10-CM | POA: Diagnosis not present

## 2023-09-07 NOTE — Progress Notes (Signed)
 Follow-Up Visit   Subjective  Lisa Wilkerson is a 78 y.o. female who presents for the following: Psoriasis f/u, patient was on skyrizi  injections but has not taken an injection in 6 months, patient was receiving injection free from manufacturer but states she ran into some issues filing out some paperwork online and since psoriasis has been well-controlled she didn't really worry about fixing issue. Patient states her psoriasis has been well-controlled even being off of injections and has not needed to use clobetasol  for flares but does have it on hand to use as needed.   Patient has place at R dorsal hand came up about couple months ago, sore to the touch when she hits it on something. She has h/o SCC L hand dorsum.  The patient has spots, moles and lesions to be evaluated, some may be new or changing and the patient may have concern these could be cancer.   The following portions of the chart were reviewed this encounter and updated as appropriate: medications, allergies, medical history  Review of Systems:  No other skin or systemic complaints except as noted in HPI or Assessment and Plan.  Objective  Well appearing patient in no apparent distress; mood and affect are within normal limits.  A focused examination was performed of the following areas: R hand   Relevant exam findings are noted in the Assessment and Plan.  Right hand dorsum 1.1 cm pink keratotic nodule    Assessment & Plan   ACTINIC DAMAGE - chronic, secondary to cumulative UV radiation exposure/sun exposure over time - diffuse scaly erythematous macules with underlying dyspigmentation - Recommend daily broad spectrum sunscreen SPF 30+ to sun-exposed areas, reapply every 2 hours as needed.  - Recommend staying in the shade or wearing long sleeves, sun glasses (UVA+UVB protection) and wide brim hats (4-inch brim around the entire circumference of the hat). - Call for new or changing lesions.  SEBORRHEIC  KERATOSIS - Stuck-on, waxy, tan-brown papules and/or plaques  - Benign-appearing - Discussed benign etiology and prognosis. - Observe - Call for any changes  PSORIASIS Exam: Clear on exam today , <1% BSA.  Chronic condition with duration or expected duration over one year. Currently well-controlled.   Patient denies joint pain  Psoriasis is a chronic non-curable, but treatable genetic/hereditary disease that may have other systemic features affecting other organ systems such as joints (Psoriatic Arthritis). It is associated with an increased risk of inflammatory bowel disease, heart disease, non-alcoholic fatty liver disease, and depression.  Treatments include light and laser treatments; topical medications; and systemic medications including oral and injectables.  Treatment Plan: Well controlled, patient off of Skyrizi  injections x 6 months, restart when psoriasis reoccurs.   Reviewed risks of biologics including immunosuppression, infections, injection site reaction, and failure to improve condition. Goal is control of skin condition, not cure.  Some older biologics such as Humira and Enbrel may slightly increase risk of malignancy and may worsen congestive heart failure.  Taltz and Cosentyx may cause inflammatory bowel disease to flare. The use of biologics requires long term medication management, including periodic office visits and monitoring of blood work.   Restart Clobetasol  cream when psoriasis reoccurs 1-2 times daily prn. Avoid applying to face, groin, and axilla. Use as directed. Long-term use can cause thinning of the skin.   Topical steroids (such as triamcinolone, fluocinolone, fluocinonide, mometasone, clobetasol , halobetasol, betamethasone , hydrocortisone) can cause thinning and lightening of the skin if they are used for too long in the same area.  Your physician has selected the right strength medicine for your problem and area affected on the body. Please use your  medication only as directed by your physician to prevent side effects.   NEOPLASM OF SKIN Right hand dorsum Epidermal / dermal shaving  Lesion diameter (cm):  1.1 Informed consent: discussed and consent obtained   Patient was prepped and draped in usual sterile fashion: area prepped with alcohol. Anesthesia: the lesion was anesthetized in a standard fashion   Anesthetic:  1% lidocaine  w/ epinephrine 1-100,000 buffered w/ 8.4% NaHCO3 Instrument used: flexible razor blade   Hemostasis achieved with: pressure, aluminum chloride and electrodesiccation   Outcome: patient tolerated procedure well    Destruction of lesion  Destruction method: electrodesiccation and curettage   Informed consent: discussed and consent obtained   Curettage performed in three different directions: Yes   Electrodesiccation performed over the curetted area: Yes   Final wound size (cm):  1.2 Hemostasis achieved with:  pressure, aluminum chloride and electrodesiccation Outcome: patient tolerated procedure well with no complications   Post-procedure details: wound care instructions given   Additional details:  Mupirocin ointment and Bandaid applied  Specimen 1 - Surgical pathology Differential Diagnosis: R/O SCC  Check Margins: No 1.1 cm pink keratotic nodule EDC today EDC today  Return in about 6 months (around 03/08/2024) for w/ Dr. Annette Barters, Psoriasis.  I, Jacquelynn V. Grier Leber, CMA, am acting as scribe for Artemio Larry, MD .   Documentation: I have reviewed the above documentation for accuracy and completeness, and I agree with the above.  Artemio Larry, MD

## 2023-09-07 NOTE — Patient Instructions (Addendum)

## 2023-09-08 ENCOUNTER — Other Ambulatory Visit: Payer: Self-pay | Admitting: Cardiology

## 2023-09-08 ENCOUNTER — Other Ambulatory Visit (HOSPITAL_COMMUNITY): Payer: Self-pay

## 2023-09-08 ENCOUNTER — Other Ambulatory Visit: Payer: Self-pay

## 2023-09-08 MED ORDER — LISINOPRIL 5 MG PO TABS
5.0000 mg | ORAL_TABLET | Freq: Every day | ORAL | 0 refills | Status: DC
  Filled 2023-09-08: qty 90, 90d supply, fill #0

## 2023-09-09 ENCOUNTER — Other Ambulatory Visit (HOSPITAL_COMMUNITY): Payer: Self-pay

## 2023-09-09 ENCOUNTER — Ambulatory Visit
Admission: RE | Admit: 2023-09-09 | Discharge: 2023-09-09 | Disposition: A | Discharge status: Home / Self Care | Source: Ambulatory Visit | Attending: Radiation Oncology | Admitting: Radiation Oncology

## 2023-09-09 VITALS — BP 119/66 | Temp 97.3°F | Resp 16 | Wt 102.0 lb

## 2023-09-09 DIAGNOSIS — C342 Malignant neoplasm of middle lobe, bronchus or lung: Secondary | ICD-10-CM | POA: Diagnosis not present

## 2023-09-09 DIAGNOSIS — F17210 Nicotine dependence, cigarettes, uncomplicated: Secondary | ICD-10-CM | POA: Diagnosis not present

## 2023-09-09 DIAGNOSIS — R911 Solitary pulmonary nodule: Secondary | ICD-10-CM | POA: Diagnosis not present

## 2023-09-09 DIAGNOSIS — C3412 Malignant neoplasm of upper lobe, left bronchus or lung: Secondary | ICD-10-CM | POA: Diagnosis not present

## 2023-09-09 DIAGNOSIS — Z510 Encounter for antineoplastic radiation therapy: Secondary | ICD-10-CM | POA: Diagnosis not present

## 2023-09-09 LAB — DERMATOLOGY PATHOLOGY

## 2023-09-09 NOTE — Progress Notes (Signed)
 Radiation Oncology Follow up Note  Name: Lisa Wilkerson   Date:   09/09/2023 MRN:  [id number] DOB: Jun 23, 1945    This 78 y.o. 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  REFERRING PROVIDER: Monique Ano, MD  HPI: Patient is a 78 year old female I have been following since November 2024 when she presented with a left upper lobe nodule slightly progressing over time.  Was also slightly hypermetabolic on PET scan per concerning for primary non-small cell lung cancer..  Recent CT scan showed a 1.2 x 2.1 cm mixed density nodule left upper lobe with 9 mm of solid component suspicious for invasive adenocarcinoma.  She underwent bronchoscopy by Dr.Al Dagmar Drones with pathology showing atypical adenomatous hyperplasia versus lipidic pattern of adenocarcinoma.  Further cytology is pending.  At the time of bronchoscopy navigational markers were placed for consideration of SBRT.  She continues to be asymptomatic although she is slightly anorexic and has poor p.o. intake.  She specifically [place] cough hemoptysis or chest tightness.  COMPLICATIONS OF TREATMENT: none  FOLLOW UP COMPLIANCE: keeps appointments   PHYSICAL EXAM:  BP 119/66   Temp (!) 97.3 F (36.3 C) (Tympanic)   Resp 16   Wt 102 lb (46.3 kg)   BMI 18.66 kg/m  Thin slightly cachectic female in NAD.  Well-developed well-nourished patient in NAD. HEENT reveals PERLA, EOMI, discs not visualized.  Oral cavity is clear. No oral mucosal lesions are identified. Neck is clear without evidence of cervical or supraclavicular adenopathy. Lungs are clear to A&P. Cardiac examination is essentially unremarkable with regular rate and rhythm without murmur rub or thrill. Abdomen is benign with no organomegaly or masses noted. Motor sensory and DTR levels are equal and symmetric in the upper and lower extremities. Cranial nerves II through XII  are grossly intact. Proprioception is intact. No peripheral adenopathy or edema is identified. No motor or sensory levels are noted. Crude visual fields are within normal range.  RADIOLOGY RESULTS: CT scans PET CT scans reviewed compatible with above-stated findings.  PLAN: Based on the nodular component slight progression over time and surgical pathology showing possible lipidic well-differentiated adenocarcinoma we will go ahead with SBRT treatment to her left mid lung field.  Will plan on delivering 60 Gray in 5 fractions.  Extremely low side effect profile including possible fatigue possible cough after procedure all were reviewed with the patient.  She seems to comprehend the treatment plan well.  I have set her up for simulation for next week.  I would like to take this opportunity to thank you for allowing me to participate in the care of your patient.Glenis Langdon, MD

## 2023-09-13 ENCOUNTER — Encounter: Payer: Self-pay | Admitting: Dermatology

## 2023-09-13 ENCOUNTER — Ambulatory Visit: Payer: Self-pay | Admitting: Dermatology

## 2023-09-13 NOTE — Telephone Encounter (Signed)
-----   Message from Artemio Larry sent at 09/13/2023 12:51 PM EDT -----  1. Skin, right hand dorsum :       WELL DIFFERENTIATED SQUAMOUS CELL CARCINOMA, REGRESSING KERATOACATHOMA TYPE    SCC skin cancer- already treated with EDC at time of biopsy  - please call patient

## 2023-09-13 NOTE — Telephone Encounter (Signed)
 Advised patient biopsy of the right hand dorsum was SCC, already treated with EDC. No further treatment.

## 2023-09-14 ENCOUNTER — Ambulatory Visit
Admission: RE | Admit: 2023-09-14 | Discharge: 2023-09-14 | Disposition: A | Discharge status: Home / Self Care | Source: Ambulatory Visit | Attending: Radiation Oncology | Admitting: Radiation Oncology

## 2023-09-14 DIAGNOSIS — F17210 Nicotine dependence, cigarettes, uncomplicated: Secondary | ICD-10-CM | POA: Diagnosis not present

## 2023-09-14 DIAGNOSIS — Z510 Encounter for antineoplastic radiation therapy: Secondary | ICD-10-CM | POA: Diagnosis not present

## 2023-09-14 DIAGNOSIS — C3412 Malignant neoplasm of upper lobe, left bronchus or lung: Secondary | ICD-10-CM | POA: Diagnosis not present

## 2023-09-15 DIAGNOSIS — Z510 Encounter for antineoplastic radiation therapy: Secondary | ICD-10-CM | POA: Diagnosis not present

## 2023-09-15 DIAGNOSIS — F17210 Nicotine dependence, cigarettes, uncomplicated: Secondary | ICD-10-CM | POA: Diagnosis not present

## 2023-09-15 DIAGNOSIS — C3412 Malignant neoplasm of upper lobe, left bronchus or lung: Secondary | ICD-10-CM | POA: Diagnosis not present

## 2023-09-22 ENCOUNTER — Other Ambulatory Visit: Payer: Self-pay

## 2023-09-22 DIAGNOSIS — J441 Chronic obstructive pulmonary disease with (acute) exacerbation: Secondary | ICD-10-CM | POA: Diagnosis not present

## 2023-09-22 MED ORDER — SULFAMETHOXAZOLE-TRIMETHOPRIM 400-80 MG PO TABS
1.0000 | ORAL_TABLET | Freq: Every day | ORAL | 0 refills | Status: DC
  Filled 2023-09-22: qty 21, 21d supply, fill #0

## 2023-09-22 MED ORDER — DEXAMETHASONE 2 MG PO TABS
ORAL_TABLET | ORAL | 0 refills | Status: DC
  Filled 2023-09-22: qty 48, 30d supply, fill #0

## 2023-09-24 ENCOUNTER — Other Ambulatory Visit: Payer: Self-pay | Admitting: Cardiology

## 2023-09-24 ENCOUNTER — Other Ambulatory Visit: Payer: Self-pay

## 2023-09-24 ENCOUNTER — Other Ambulatory Visit (HOSPITAL_COMMUNITY): Payer: Self-pay

## 2023-09-24 MED ORDER — CLOPIDOGREL BISULFATE 75 MG PO TABS
75.0000 mg | ORAL_TABLET | Freq: Every day | ORAL | 1 refills | Status: DC
  Filled 2023-09-24: qty 90, 90d supply, fill #0
  Filled 2024-01-14: qty 90, 90d supply, fill #1

## 2023-09-30 ENCOUNTER — Other Ambulatory Visit: Payer: Self-pay

## 2023-09-30 ENCOUNTER — Ambulatory Visit
Admission: RE | Admit: 2023-09-30 | Discharge: 2023-09-30 | Disposition: A | Discharge status: Home / Self Care | Source: Ambulatory Visit | Attending: Radiation Oncology | Admitting: Radiation Oncology

## 2023-09-30 DIAGNOSIS — Z510 Encounter for antineoplastic radiation therapy: Secondary | ICD-10-CM | POA: Diagnosis not present

## 2023-09-30 DIAGNOSIS — F17210 Nicotine dependence, cigarettes, uncomplicated: Secondary | ICD-10-CM | POA: Diagnosis not present

## 2023-09-30 DIAGNOSIS — C3412 Malignant neoplasm of upper lobe, left bronchus or lung: Secondary | ICD-10-CM | POA: Diagnosis not present

## 2023-09-30 LAB — RAD ONC ARIA SESSION SUMMARY
Course Elapsed Days: 0
Plan Fractions Treated to Date: 1
Plan Prescribed Dose Per Fraction: 12 Gy
Plan Total Fractions Prescribed: 5
Plan Total Prescribed Dose: 60 Gy
Reference Point Dosage Given to Date: 12 Gy
Reference Point Session Dosage Given: 12 Gy
Session Number: 1

## 2023-10-05 ENCOUNTER — Other Ambulatory Visit: Payer: Self-pay

## 2023-10-05 ENCOUNTER — Ambulatory Visit
Admission: RE | Admit: 2023-10-05 | Discharge: 2023-10-05 | Disposition: A | Discharge status: Home / Self Care | Source: Ambulatory Visit | Attending: Radiation Oncology | Admitting: Radiation Oncology

## 2023-10-05 DIAGNOSIS — C342 Malignant neoplasm of middle lobe, bronchus or lung: Secondary | ICD-10-CM | POA: Diagnosis not present

## 2023-10-05 DIAGNOSIS — Z510 Encounter for antineoplastic radiation therapy: Secondary | ICD-10-CM | POA: Diagnosis not present

## 2023-10-05 DIAGNOSIS — C3412 Malignant neoplasm of upper lobe, left bronchus or lung: Secondary | ICD-10-CM | POA: Diagnosis not present

## 2023-10-05 DIAGNOSIS — R911 Solitary pulmonary nodule: Secondary | ICD-10-CM | POA: Diagnosis not present

## 2023-10-05 DIAGNOSIS — F17210 Nicotine dependence, cigarettes, uncomplicated: Secondary | ICD-10-CM | POA: Diagnosis not present

## 2023-10-05 LAB — RAD ONC ARIA SESSION SUMMARY
Course Elapsed Days: 5
Plan Fractions Treated to Date: 2
Plan Prescribed Dose Per Fraction: 12 Gy
Plan Total Fractions Prescribed: 5
Plan Total Prescribed Dose: 60 Gy
Reference Point Dosage Given to Date: 24 Gy
Reference Point Session Dosage Given: 12 Gy
Session Number: 2

## 2023-10-07 ENCOUNTER — Inpatient Hospital Stay: Admitting: Anesthesiology

## 2023-10-07 ENCOUNTER — Encounter
Admission: EM | Disposition: A | Discharge status: Skilled Nursing Facility | Payer: Self-pay | Source: Home / Self Care | Attending: Internal Medicine

## 2023-10-07 ENCOUNTER — Other Ambulatory Visit: Payer: Self-pay

## 2023-10-07 ENCOUNTER — Inpatient Hospital Stay

## 2023-10-07 ENCOUNTER — Emergency Department

## 2023-10-07 ENCOUNTER — Ambulatory Visit

## 2023-10-07 ENCOUNTER — Inpatient Hospital Stay
Admission: EM | Admit: 2023-10-07 | Discharge: 2023-10-15 | DRG: 480 | Disposition: A | Discharge status: Skilled Nursing Facility | Attending: Internal Medicine | Admitting: Internal Medicine

## 2023-10-07 DIAGNOSIS — Z79899 Other long term (current) drug therapy: Secondary | ICD-10-CM

## 2023-10-07 DIAGNOSIS — I10 Essential (primary) hypertension: Secondary | ICD-10-CM | POA: Diagnosis not present

## 2023-10-07 DIAGNOSIS — Z8679 Personal history of other diseases of the circulatory system: Secondary | ICD-10-CM

## 2023-10-07 DIAGNOSIS — S0003XA Contusion of scalp, initial encounter: Secondary | ICD-10-CM | POA: Diagnosis not present

## 2023-10-07 DIAGNOSIS — S72002A Fracture of unspecified part of neck of left femur, initial encounter for closed fracture: Secondary | ICD-10-CM | POA: Diagnosis present

## 2023-10-07 DIAGNOSIS — Z85118 Personal history of other malignant neoplasm of bronchus and lung: Secondary | ICD-10-CM | POA: Diagnosis not present

## 2023-10-07 DIAGNOSIS — N179 Acute kidney failure, unspecified: Secondary | ICD-10-CM | POA: Diagnosis present

## 2023-10-07 DIAGNOSIS — W010XXA Fall on same level from slipping, tripping and stumbling without subsequent striking against object, initial encounter: Secondary | ICD-10-CM | POA: Diagnosis present

## 2023-10-07 DIAGNOSIS — Z743 Need for continuous supervision: Secondary | ICD-10-CM | POA: Diagnosis not present

## 2023-10-07 DIAGNOSIS — C3412 Malignant neoplasm of upper lobe, left bronchus or lung: Secondary | ICD-10-CM | POA: Diagnosis present

## 2023-10-07 DIAGNOSIS — I70219 Atherosclerosis of native arteries of extremities with intermittent claudication, unspecified extremity: Secondary | ICD-10-CM | POA: Diagnosis present

## 2023-10-07 DIAGNOSIS — E785 Hyperlipidemia, unspecified: Secondary | ICD-10-CM | POA: Diagnosis present

## 2023-10-07 DIAGNOSIS — S7292XA Unspecified fracture of left femur, initial encounter for closed fracture: Secondary | ICD-10-CM | POA: Diagnosis not present

## 2023-10-07 DIAGNOSIS — I252 Old myocardial infarction: Secondary | ICD-10-CM

## 2023-10-07 DIAGNOSIS — Z85828 Personal history of other malignant neoplasm of skin: Secondary | ICD-10-CM | POA: Diagnosis not present

## 2023-10-07 DIAGNOSIS — Z681 Body mass index (BMI) 19 or less, adult: Secondary | ICD-10-CM | POA: Diagnosis not present

## 2023-10-07 DIAGNOSIS — D631 Anemia in chronic kidney disease: Secondary | ICD-10-CM | POA: Diagnosis not present

## 2023-10-07 DIAGNOSIS — I6522 Occlusion and stenosis of left carotid artery: Secondary | ICD-10-CM | POA: Diagnosis present

## 2023-10-07 DIAGNOSIS — F0390 Unspecified dementia without behavioral disturbance: Secondary | ICD-10-CM | POA: Diagnosis present

## 2023-10-07 DIAGNOSIS — F17210 Nicotine dependence, cigarettes, uncomplicated: Secondary | ICD-10-CM | POA: Diagnosis present

## 2023-10-07 DIAGNOSIS — E440 Moderate protein-calorie malnutrition: Secondary | ICD-10-CM | POA: Insufficient documentation

## 2023-10-07 DIAGNOSIS — Z8616 Personal history of COVID-19: Secondary | ICD-10-CM

## 2023-10-07 DIAGNOSIS — Z7951 Long term (current) use of inhaled steroids: Secondary | ICD-10-CM | POA: Diagnosis not present

## 2023-10-07 DIAGNOSIS — I2510 Atherosclerotic heart disease of native coronary artery without angina pectoris: Secondary | ICD-10-CM | POA: Diagnosis present

## 2023-10-07 DIAGNOSIS — I517 Cardiomegaly: Secondary | ICD-10-CM | POA: Diagnosis not present

## 2023-10-07 DIAGNOSIS — N390 Urinary tract infection, site not specified: Secondary | ICD-10-CM | POA: Diagnosis present

## 2023-10-07 DIAGNOSIS — Z961 Presence of intraocular lens: Secondary | ICD-10-CM | POA: Diagnosis present

## 2023-10-07 DIAGNOSIS — D72829 Elevated white blood cell count, unspecified: Secondary | ICD-10-CM | POA: Diagnosis not present

## 2023-10-07 DIAGNOSIS — R110 Nausea: Secondary | ICD-10-CM | POA: Diagnosis not present

## 2023-10-07 DIAGNOSIS — Z9842 Cataract extraction status, left eye: Secondary | ICD-10-CM

## 2023-10-07 DIAGNOSIS — Z9861 Coronary angioplasty status: Secondary | ICD-10-CM

## 2023-10-07 DIAGNOSIS — D696 Thrombocytopenia, unspecified: Secondary | ICD-10-CM | POA: Diagnosis present

## 2023-10-07 DIAGNOSIS — I129 Hypertensive chronic kidney disease with stage 1 through stage 4 chronic kidney disease, or unspecified chronic kidney disease: Secondary | ICD-10-CM | POA: Diagnosis present

## 2023-10-07 DIAGNOSIS — Z9841 Cataract extraction status, right eye: Secondary | ICD-10-CM

## 2023-10-07 DIAGNOSIS — D649 Anemia, unspecified: Secondary | ICD-10-CM

## 2023-10-07 DIAGNOSIS — I714 Abdominal aortic aneurysm, without rupture, unspecified: Secondary | ICD-10-CM | POA: Diagnosis present

## 2023-10-07 DIAGNOSIS — M85852 Other specified disorders of bone density and structure, left thigh: Secondary | ICD-10-CM | POA: Diagnosis not present

## 2023-10-07 DIAGNOSIS — S79929A Unspecified injury of unspecified thigh, initial encounter: Secondary | ICD-10-CM | POA: Diagnosis not present

## 2023-10-07 DIAGNOSIS — N1832 Chronic kidney disease, stage 3b: Secondary | ICD-10-CM | POA: Diagnosis present

## 2023-10-07 DIAGNOSIS — J449 Chronic obstructive pulmonary disease, unspecified: Secondary | ICD-10-CM | POA: Diagnosis present

## 2023-10-07 DIAGNOSIS — I959 Hypotension, unspecified: Secondary | ICD-10-CM

## 2023-10-07 DIAGNOSIS — Z7982 Long term (current) use of aspirin: Secondary | ICD-10-CM

## 2023-10-07 DIAGNOSIS — I6529 Occlusion and stenosis of unspecified carotid artery: Secondary | ICD-10-CM | POA: Diagnosis present

## 2023-10-07 DIAGNOSIS — K118 Other diseases of salivary glands: Secondary | ICD-10-CM | POA: Diagnosis not present

## 2023-10-07 DIAGNOSIS — F17200 Nicotine dependence, unspecified, uncomplicated: Secondary | ICD-10-CM | POA: Insufficient documentation

## 2023-10-07 DIAGNOSIS — R0902 Hypoxemia: Secondary | ICD-10-CM | POA: Diagnosis not present

## 2023-10-07 DIAGNOSIS — Z7902 Long term (current) use of antithrombotics/antiplatelets: Secondary | ICD-10-CM

## 2023-10-07 DIAGNOSIS — D62 Acute posthemorrhagic anemia: Secondary | ICD-10-CM | POA: Diagnosis present

## 2023-10-07 DIAGNOSIS — I700 Atherosclerosis of aorta: Secondary | ICD-10-CM | POA: Diagnosis not present

## 2023-10-07 DIAGNOSIS — I7143 Infrarenal abdominal aortic aneurysm, without rupture: Secondary | ICD-10-CM | POA: Diagnosis not present

## 2023-10-07 DIAGNOSIS — S199XXA Unspecified injury of neck, initial encounter: Secondary | ICD-10-CM | POA: Diagnosis not present

## 2023-10-07 DIAGNOSIS — S72142A Displaced intertrochanteric fracture of left femur, initial encounter for closed fracture: Secondary | ICD-10-CM | POA: Diagnosis present

## 2023-10-07 DIAGNOSIS — S0990XA Unspecified injury of head, initial encounter: Secondary | ICD-10-CM | POA: Diagnosis not present

## 2023-10-07 DIAGNOSIS — R578 Other shock: Secondary | ICD-10-CM | POA: Diagnosis present

## 2023-10-07 DIAGNOSIS — S7012XA Contusion of left thigh, initial encounter: Secondary | ICD-10-CM | POA: Diagnosis present

## 2023-10-07 DIAGNOSIS — I70202 Unspecified atherosclerosis of native arteries of extremities, left leg: Secondary | ICD-10-CM | POA: Diagnosis not present

## 2023-10-07 DIAGNOSIS — Z8673 Personal history of transient ischemic attack (TIA), and cerebral infarction without residual deficits: Secondary | ICD-10-CM | POA: Diagnosis not present

## 2023-10-07 DIAGNOSIS — J432 Centrilobular emphysema: Secondary | ICD-10-CM | POA: Diagnosis not present

## 2023-10-07 HISTORY — PX: INTRAMEDULLARY (IM) NAIL INTERTROCHANTERIC: SHX5875

## 2023-10-07 LAB — RENAL FUNCTION PANEL
Albumin: 2.3 g/dL — ABNORMAL LOW (ref 3.5–5.0)
Anion gap: 13 (ref 5–15)
BUN: 40 mg/dL — ABNORMAL HIGH (ref 8–23)
CO2: 16 mmol/L — ABNORMAL LOW (ref 22–32)
Calcium: 7.8 mg/dL — ABNORMAL LOW (ref 8.9–10.3)
Chloride: 108 mmol/L (ref 98–111)
Creatinine, Ser: 1.69 mg/dL — ABNORMAL HIGH (ref 0.44–1.00)
GFR, Estimated: 31 mL/min — ABNORMAL LOW (ref >60)
Glucose, Bld: 176 mg/dL — ABNORMAL HIGH (ref 70–99)
Phosphorus: 5.2 mg/dL — ABNORMAL HIGH (ref 2.5–4.6)
Potassium: 5.7 mmol/L — ABNORMAL HIGH (ref 3.5–5.1)
Sodium: 137 mmol/L (ref 135–145)

## 2023-10-07 LAB — CBC WITH DIFFERENTIAL/PLATELET
Abs Immature Granulocytes: 0.15 10*3/uL — ABNORMAL HIGH (ref 0.00–0.07)
Abs Immature Granulocytes: 0.2 10*3/uL — ABNORMAL HIGH (ref 0.00–0.07)
Abs Immature Granulocytes: 0.22 10*3/uL — ABNORMAL HIGH (ref 0.00–0.07)
Basophils Absolute: 0 10*3/uL (ref 0.0–0.1)
Basophils Absolute: 0 10*3/uL (ref 0.0–0.1)
Basophils Absolute: 0 10*3/uL (ref 0.0–0.1)
Basophils Relative: 0 %
Basophils Relative: 0 %
Basophils Relative: 0 %
Eosinophils Absolute: 0.1 10*3/uL (ref 0.0–0.5)
Eosinophils Absolute: 0 10*3/uL (ref 0.0–0.5)
Eosinophils Absolute: 0 10*3/uL (ref 0.0–0.5)
Eosinophils Relative: 1 %
Eosinophils Relative: 0 %
Eosinophils Relative: 0 %
HCT: 30.4 % — ABNORMAL LOW (ref 36.0–46.0)
HCT: 18.2 % — ABNORMAL LOW (ref 36.0–46.0)
HCT: 29 % — ABNORMAL LOW (ref 36.0–46.0)
Hemoglobin: 9.5 g/dL — ABNORMAL LOW (ref 12.0–15.0)
Hemoglobin: 5.8 g/dL — ABNORMAL LOW (ref 12.0–15.0)
Hemoglobin: 9.6 g/dL — ABNORMAL LOW (ref 12.0–15.0)
Immature Granulocytes: 1 %
Immature Granulocytes: 2 %
Immature Granulocytes: 1 %
Lymphocytes Relative: 16 %
Lymphocytes Relative: 6 %
Lymphocytes Relative: 3 %
Lymphs Abs: 2.6 10*3/uL (ref 0.7–4.0)
Lymphs Abs: 0.8 10*3/uL (ref 0.7–4.0)
Lymphs Abs: 0.5 10*3/uL — ABNORMAL LOW (ref 0.7–4.0)
MCH: 29.7 pg (ref 26.0–34.0)
MCH: 30.7 pg (ref 26.0–34.0)
MCH: 31.6 pg (ref 26.0–34.0)
MCHC: 31.3 g/dL (ref 30.0–36.0)
MCHC: 31.9 g/dL (ref 30.0–36.0)
MCHC: 33.1 g/dL (ref 30.0–36.0)
MCV: 95 fL (ref 80.0–100.0)
MCV: 96.3 fL (ref 80.0–100.0)
MCV: 95.4 fL (ref 80.0–100.0)
Monocytes Absolute: 1.2 10*3/uL — ABNORMAL HIGH (ref 0.1–1.0)
Monocytes Absolute: 0.6 10*3/uL (ref 0.1–1.0)
Monocytes Absolute: 1.1 10*3/uL — ABNORMAL HIGH (ref 0.1–1.0)
Monocytes Relative: 7 %
Monocytes Relative: 4 %
Monocytes Relative: 7 %
Neutro Abs: 12.4 10*3/uL — ABNORMAL HIGH (ref 1.7–7.7)
Neutro Abs: 11.7 10*3/uL — ABNORMAL HIGH (ref 1.7–7.7)
Neutro Abs: 14.2 10*3/uL — ABNORMAL HIGH (ref 1.7–7.7)
Neutrophils Relative %: 75 %
Neutrophils Relative %: 88 %
Neutrophils Relative %: 89 %
Platelets: 159 10*3/uL (ref 150–400)
Platelets: 90 10*3/uL — ABNORMAL LOW (ref 150–400)
Platelets: 57 10*3/uL — ABNORMAL LOW (ref 150–400)
RBC: 3.2 MIL/uL — ABNORMAL LOW (ref 3.87–5.11)
RBC: 1.89 MIL/uL — ABNORMAL LOW (ref 3.87–5.11)
RBC: 3.04 MIL/uL — ABNORMAL LOW (ref 3.87–5.11)
RDW: 16.9 % — ABNORMAL HIGH (ref 11.5–15.5)
RDW: 17.2 % — ABNORMAL HIGH (ref 11.5–15.5)
RDW: 15.3 % (ref 11.5–15.5)
Smear Review: DECREASED
Smear Review: DECREASED
WBC: 16.4 10*3/uL — ABNORMAL HIGH (ref 4.0–10.5)
WBC: 13.3 10*3/uL — ABNORMAL HIGH (ref 4.0–10.5)
WBC: 16.1 10*3/uL — ABNORMAL HIGH (ref 4.0–10.5)
nRBC: 0 % (ref 0.0–0.2)
nRBC: 0.2 % (ref 0.0–0.2)
nRBC: 0 % (ref 0.0–0.2)

## 2023-10-07 LAB — PROTIME-INR
INR: 1 (ref 0.8–1.2)
INR: 1.3 — ABNORMAL HIGH (ref 0.8–1.2)
INR: 1.3 — ABNORMAL HIGH (ref 0.8–1.2)
Prothrombin Time: 13.9 s (ref 11.4–15.2)
Prothrombin Time: 17.3 s — ABNORMAL HIGH (ref 11.4–15.2)
Prothrombin Time: 17.4 s — ABNORMAL HIGH (ref 11.4–15.2)

## 2023-10-07 LAB — PREPARE RBC (CROSSMATCH)

## 2023-10-07 LAB — BASIC METABOLIC PANEL WITH GFR
Anion gap: 8 (ref 5–15)
Anion gap: 11 (ref 5–15)
BUN: 43 mg/dL — ABNORMAL HIGH (ref 8–23)
BUN: 42 mg/dL — ABNORMAL HIGH (ref 8–23)
CO2: 25 mmol/L (ref 22–32)
CO2: 19 mmol/L — ABNORMAL LOW (ref 22–32)
Calcium: 8.4 mg/dL — ABNORMAL LOW (ref 8.9–10.3)
Calcium: 8 mg/dL — ABNORMAL LOW (ref 8.9–10.3)
Chloride: 104 mmol/L (ref 98–111)
Chloride: 108 mmol/L (ref 98–111)
Creatinine, Ser: 1.91 mg/dL — ABNORMAL HIGH (ref 0.44–1.00)
Creatinine, Ser: 2.04 mg/dL — ABNORMAL HIGH (ref 0.44–1.00)
GFR, Estimated: 27 mL/min — ABNORMAL LOW (ref >60)
GFR, Estimated: 25 mL/min — ABNORMAL LOW (ref >60)
Glucose, Bld: 102 mg/dL — ABNORMAL HIGH (ref 70–99)
Glucose, Bld: 147 mg/dL — ABNORMAL HIGH (ref 70–99)
Potassium: 4.9 mmol/L (ref 3.5–5.1)
Potassium: 5.1 mmol/L (ref 3.5–5.1)
Sodium: 137 mmol/L (ref 135–145)
Sodium: 138 mmol/L (ref 135–145)

## 2023-10-07 LAB — CBC
HCT: 29.1 % — ABNORMAL LOW (ref 36.0–46.0)
Hemoglobin: 9.6 g/dL — ABNORMAL LOW (ref 12.0–15.0)
MCH: 31.5 pg (ref 26.0–34.0)
MCHC: 33 g/dL (ref 30.0–36.0)
MCV: 95.4 fL (ref 80.0–100.0)
Platelets: 58 10*3/uL — ABNORMAL LOW (ref 150–400)
RBC: 3.05 MIL/uL — ABNORMAL LOW (ref 3.87–5.11)
RDW: 15.2 % (ref 11.5–15.5)
WBC: 15.8 10*3/uL — ABNORMAL HIGH (ref 4.0–10.5)
nRBC: 0 % (ref 0.0–0.2)

## 2023-10-07 LAB — FERRITIN: Ferritin: 91 ng/mL (ref 11–307)

## 2023-10-07 LAB — RETICULOCYTES
Immature Retic Fract: 8.7 % (ref 2.3–15.9)
RBC.: 3.16 MIL/uL — ABNORMAL LOW (ref 3.87–5.11)
Retic Count, Absolute: 44.2 10*3/uL (ref 19.0–186.0)
Retic Ct Pct: 1.4 % (ref 0.4–3.1)

## 2023-10-07 LAB — LACTIC ACID, PLASMA
Lactic Acid, Venous: 5.4 mmol/L (ref 0.5–1.9)
Lactic Acid, Venous: 4.2 mmol/L (ref 0.5–1.9)

## 2023-10-07 LAB — VITAMIN B12: Vitamin B-12: 225 pg/mL (ref 180–914)

## 2023-10-07 LAB — IRON AND TIBC
Iron: 44 ug/dL (ref 28–170)
Saturation Ratios: 18 % (ref 10.4–31.8)
TIBC: 248 ug/dL — ABNORMAL LOW (ref 250–450)
UIBC: 204 ug/dL

## 2023-10-07 LAB — GLUCOSE, CAPILLARY: Glucose-Capillary: 156 mg/dL — ABNORMAL HIGH (ref 70–99)

## 2023-10-07 LAB — FIBRINOGEN
Fibrinogen: 153 mg/dL — ABNORMAL LOW (ref 210–475)
Fibrinogen: 186 mg/dL — ABNORMAL LOW (ref 210–475)

## 2023-10-07 LAB — TROPONIN I (HIGH SENSITIVITY)
Troponin I (High Sensitivity): 21 ng/L — ABNORMAL HIGH (ref <18)
Troponin I (High Sensitivity): 18 ng/L — ABNORMAL HIGH (ref <18)

## 2023-10-07 LAB — D-DIMER, QUANTITATIVE: D-Dimer, Quant: 10.31 ug{FEU}/mL — ABNORMAL HIGH (ref 0.00–0.50)

## 2023-10-07 LAB — FOLATE: Folate: 4.9 ng/mL — ABNORMAL LOW (ref >5.9)

## 2023-10-07 SURGERY — FIXATION, FRACTURE, INTERTROCHANTERIC, WITH INTRAMEDULLARY ROD
Anesthesia: General | Site: Hip | Laterality: Left

## 2023-10-07 MED ORDER — SODIUM CHLORIDE 0.9 % IV SOLN
250.0000 mL | INTRAVENOUS | Status: AC
  Administered 2023-10-07: 250 mL via INTRAVENOUS

## 2023-10-07 MED ORDER — METOCLOPRAMIDE HCL 5 MG PO TABS: 5.0000 mg | ORAL_TABLET | Freq: Three times a day (TID) | ORAL | Status: DC | PRN

## 2023-10-07 MED ORDER — SODIUM ZIRCONIUM CYCLOSILICATE 5 G PO PACK
5.0000 g | PACK | Freq: Once | ORAL | Status: AC
  Administered 2023-10-07: 5 g via ORAL
  Filled 2023-10-07: qty 1

## 2023-10-07 MED ORDER — OXYCODONE HCL 5 MG PO TABS: 5.0000 mg | ORAL_TABLET | Freq: Once | ORAL | Status: DC | PRN

## 2023-10-07 MED ORDER — ROCURONIUM BROMIDE 10 MG/ML (PF) SYRINGE
PREFILLED_SYRINGE | INTRAVENOUS | Status: AC
  Filled 2023-10-07: qty 10

## 2023-10-07 MED ORDER — ENSURE PLUS HIGH PROTEIN PO LIQD: 237.0000 mL | Freq: Two times a day (BID) | ORAL | Status: DC

## 2023-10-07 MED ORDER — TRAMADOL HCL 50 MG PO TABS: 50.0000 mg | ORAL_TABLET | Freq: Four times a day (QID) | ORAL | Status: DC | PRN

## 2023-10-07 MED ORDER — CHLORHEXIDINE GLUCONATE CLOTH 2 % EX PADS
6.0000 | MEDICATED_PAD | Freq: Every day | CUTANEOUS | Status: DC
  Administered 2023-10-08 – 2023-10-13 (×6): 6 via TOPICAL

## 2023-10-07 MED ORDER — ADULT MULTIVITAMIN W/MINERALS CH
1.0000 | ORAL_TABLET | Freq: Every day | ORAL | Status: DC
  Administered 2023-10-08 – 2023-10-15 (×8): 1 via ORAL
  Filled 2023-10-07 (×8): qty 1

## 2023-10-07 MED ORDER — LIDOCAINE HCL (CARDIAC) PF 100 MG/5ML IV SOSY
PREFILLED_SYRINGE | INTRAVENOUS | Status: DC | PRN
  Administered 2023-10-07: 60 mg via INTRAVENOUS

## 2023-10-07 MED ORDER — DEXTROSE 50 % IV SOLN
1.0000 | Freq: Once | INTRAVENOUS | Status: AC
  Administered 2023-10-07: 50 mL via INTRAVENOUS
  Filled 2023-10-07: qty 50

## 2023-10-07 MED ORDER — OXYCODONE HCL 5 MG PO TABS: 2.5000 mg | ORAL_TABLET | ORAL | Status: DC | PRN

## 2023-10-07 MED ORDER — IPRATROPIUM-ALBUTEROL 0.5-2.5 (3) MG/3ML IN SOLN: 3.0000 mL | Freq: Four times a day (QID) | RESPIRATORY_TRACT | Status: DC | PRN

## 2023-10-07 MED ORDER — METHOCARBAMOL 1000 MG/10ML IJ SOLN
500.0000 mg | Freq: Four times a day (QID) | INTRAMUSCULAR | Status: DC | PRN
  Administered 2023-10-07: 500 mg via INTRAVENOUS
  Filled 2023-10-07 (×2): qty 5

## 2023-10-07 MED ORDER — BUPIVACAINE HCL (PF) 0.5 % IJ SOLN
INTRAMUSCULAR | Status: AC
  Filled 2023-10-07: qty 30

## 2023-10-07 MED ORDER — KETAMINE HCL 50 MG/5ML IJ SOSY
PREFILLED_SYRINGE | INTRAMUSCULAR | Status: DC | PRN
  Administered 2023-10-07: 20 mg via INTRAVENOUS

## 2023-10-07 MED ORDER — ALBUTEROL SULFATE (2.5 MG/3ML) 0.083% IN NEBU
INHALATION_SOLUTION | RESPIRATORY_TRACT | Status: AC
  Filled 2023-10-07: qty 3

## 2023-10-07 MED ORDER — ACETAMINOPHEN 10 MG/ML IV SOLN
INTRAVENOUS | Status: AC
  Filled 2023-10-07: qty 100

## 2023-10-07 MED ORDER — BISACODYL 10 MG RE SUPP
10.0000 mg | Freq: Every day | RECTAL | Status: DC | PRN
  Administered 2023-10-12: 10 mg via RECTAL
  Filled 2023-10-07: qty 1

## 2023-10-07 MED ORDER — CEFAZOLIN SODIUM-DEXTROSE 1-4 GM/50ML-% IV SOLN
1.0000 g | Freq: Four times a day (QID) | INTRAVENOUS | Status: AC
  Administered 2023-10-07 – 2023-10-08 (×3): 1 g via INTRAVENOUS
  Filled 2023-10-07 (×3): qty 50

## 2023-10-07 MED ORDER — LACTATED RINGERS IV SOLN: INTRAVENOUS | Status: DC | PRN

## 2023-10-07 MED ORDER — ONDANSETRON HCL 4 MG/2ML IJ SOLN
INTRAMUSCULAR | Status: DC | PRN
  Administered 2023-10-07: 4 mg via INTRAVENOUS

## 2023-10-07 MED ORDER — ONDANSETRON HCL 4 MG PO TABS: 4.0000 mg | ORAL_TABLET | Freq: Four times a day (QID) | ORAL | Status: DC | PRN

## 2023-10-07 MED ORDER — EPHEDRINE SULFATE-NACL 50-0.9 MG/10ML-% IV SOSY
PREFILLED_SYRINGE | INTRAVENOUS | Status: DC | PRN
  Administered 2023-10-07 (×4): 10 mg via INTRAVENOUS
  Administered 2023-10-07: 15 mg via INTRAVENOUS

## 2023-10-07 MED ORDER — ONDANSETRON HCL 4 MG/2ML IJ SOLN
4.0000 mg | INTRAMUSCULAR | Status: AC
  Administered 2023-10-07: 4 mg via INTRAVENOUS
  Filled 2023-10-07: qty 2

## 2023-10-07 MED ORDER — MIRTAZAPINE 15 MG PO TABS
15.0000 mg | ORAL_TABLET | Freq: Every day | ORAL | Status: DC
  Administered 2023-10-08 – 2023-10-15 (×8): 15 mg via ORAL
  Filled 2023-10-07 (×8): qty 1

## 2023-10-07 MED ORDER — CALCIUM CHLORIDE 10 % IV SOLN
INTRAVENOUS | Status: AC
  Filled 2023-10-07: qty 10

## 2023-10-07 MED ORDER — INSULIN ASPART 100 UNIT/ML IJ SOLN
10.0000 [IU] | Freq: Once | INTRAMUSCULAR | Status: AC
  Administered 2023-10-07: 10 [IU] via INTRAVENOUS
  Filled 2023-10-07: qty 1

## 2023-10-07 MED ORDER — ATORVASTATIN CALCIUM 80 MG PO TABS
80.0000 mg | ORAL_TABLET | Freq: Every day | ORAL | Status: DC
  Administered 2023-10-08 – 2023-10-15 (×8): 80 mg via ORAL
  Filled 2023-10-07 (×6): qty 1
  Filled 2023-10-07: qty 4
  Filled 2023-10-07: qty 1

## 2023-10-07 MED ORDER — TRANEXAMIC ACID 1000 MG/10ML IV SOLN
1000.0000 mg | Freq: Once | INTRAVENOUS | Status: DC
  Filled 2023-10-07: qty 10

## 2023-10-07 MED ORDER — PHENYLEPHRINE 80 MCG/ML (10ML) SYRINGE FOR IV PUSH (FOR BLOOD PRESSURE SUPPORT)
PREFILLED_SYRINGE | INTRAVENOUS | Status: DC | PRN
  Administered 2023-10-07 (×7): 80 ug via INTRAVENOUS

## 2023-10-07 MED ORDER — METHOCARBAMOL 500 MG PO TABS: 500.0000 mg | ORAL_TABLET | Freq: Four times a day (QID) | ORAL | Status: DC | PRN

## 2023-10-07 MED ORDER — CHLORHEXIDINE GLUCONATE 0.12 % MT SOLN
OROMUCOSAL | Status: AC
  Filled 2023-10-07: qty 15

## 2023-10-07 MED ORDER — TRANEXAMIC ACID-NACL 1000-0.7 MG/100ML-% IV SOLN
1000.0000 mg | Freq: Once | INTRAVENOUS | Status: AC
  Administered 2023-10-07: 1000 mg via INTRAVENOUS
  Filled 2023-10-07: qty 100

## 2023-10-07 MED ORDER — CALCIUM GLUCONATE-NACL 1-0.675 GM/50ML-% IV SOLN
1.0000 g | Freq: Once | INTRAVENOUS | Status: AC
  Administered 2023-10-07: 1000 mg via INTRAVENOUS
  Filled 2023-10-07: qty 50

## 2023-10-07 MED ORDER — ROCURONIUM BROMIDE 100 MG/10ML IV SOLN
INTRAVENOUS | Status: DC | PRN
  Administered 2023-10-07: 30 mg via INTRAVENOUS
  Administered 2023-10-07: 10 mg via INTRAVENOUS

## 2023-10-07 MED ORDER — IPRATROPIUM-ALBUTEROL 0.5-2.5 (3) MG/3ML IN SOLN: 3.0000 mL | Freq: Once | RESPIRATORY_TRACT | Status: DC

## 2023-10-07 MED ORDER — BUPIVACAINE LIPOSOME 1.3 % IJ SUSP
INTRAMUSCULAR | Status: AC
  Filled 2023-10-07: qty 20

## 2023-10-07 MED ORDER — DOCUSATE SODIUM 100 MG PO CAPS
100.0000 mg | ORAL_CAPSULE | Freq: Two times a day (BID) | ORAL | Status: DC
  Administered 2023-10-07 – 2023-10-15 (×15): 100 mg via ORAL
  Filled 2023-10-07 (×16): qty 1

## 2023-10-07 MED ORDER — SODIUM CHLORIDE 0.9 % IV BOLUS
1000.0000 mL | Freq: Once | INTRAVENOUS | Status: AC
  Administered 2023-10-07: 1000 mL via INTRAVENOUS

## 2023-10-07 MED ORDER — PHENYLEPHRINE 80 MCG/ML (10ML) SYRINGE FOR IV PUSH (FOR BLOOD PRESSURE SUPPORT)
PREFILLED_SYRINGE | INTRAVENOUS | Status: AC
  Filled 2023-10-07: qty 10

## 2023-10-07 MED ORDER — SUGAMMADEX SODIUM 200 MG/2ML IV SOLN
INTRAVENOUS | Status: DC | PRN
  Administered 2023-10-07 (×2): 100 mg via INTRAVENOUS

## 2023-10-07 MED ORDER — HYDROCODONE-ACETAMINOPHEN 5-325 MG PO TABS: 1.0000 | ORAL_TABLET | Freq: Four times a day (QID) | ORAL | Status: DC | PRN

## 2023-10-07 MED ORDER — METHOCARBAMOL 1000 MG/10ML IJ SOLN: 500.0000 mg | Freq: Four times a day (QID) | INTRAMUSCULAR | Status: DC | PRN

## 2023-10-07 MED ORDER — MORPHINE SULFATE (PF) 4 MG/ML IV SOLN
4.0000 mg | Freq: Once | INTRAVENOUS | Status: AC
  Administered 2023-10-07: 4 mg via INTRAVENOUS
  Filled 2023-10-07: qty 1

## 2023-10-07 MED ORDER — PHENYLEPHRINE HCL-NACL 20-0.9 MG/250ML-% IV SOLN
10.0000 ug/min | Freq: Once | INTRAVENOUS | Status: DC
  Filled 2023-10-07: qty 250

## 2023-10-07 MED ORDER — LACTATED RINGERS IV SOLN: INTRAVENOUS | Status: DC

## 2023-10-07 MED ORDER — TRANEXAMIC ACID-NACL 1000-0.7 MG/100ML-% IV SOLN
1000.0000 mg | Freq: Once | INTRAVENOUS | Status: DC
  Administered 2023-10-07: 1000 mg via INTRAVENOUS

## 2023-10-07 MED ORDER — MORPHINE SULFATE (PF) 2 MG/ML IV SOLN
2.0000 mg | INTRAVENOUS | Status: DC | PRN
  Administered 2023-10-07 (×2): 2 mg via INTRAVENOUS
  Filled 2023-10-07 (×2): qty 1

## 2023-10-07 MED ORDER — SODIUM BICARBONATE 8.4 % IV SOLN
50.0000 meq | Freq: Once | INTRAVENOUS | Status: AC
  Administered 2023-10-07: 50 meq via INTRAVENOUS
  Filled 2023-10-07: qty 50

## 2023-10-07 MED ORDER — ALBUMIN HUMAN 5 % IV SOLN
INTRAVENOUS | Status: AC
  Filled 2023-10-07: qty 250

## 2023-10-07 MED ORDER — FENTANYL CITRATE (PF) 100 MCG/2ML IJ SOLN: 25.0000 ug | INTRAMUSCULAR | Status: DC | PRN

## 2023-10-07 MED ORDER — PHENYLEPHRINE HCL-NACL 20-0.9 MG/250ML-% IV SOLN: INTRAVENOUS | Status: DC | PRN

## 2023-10-07 MED ORDER — SODIUM CHLORIDE 0.9% IV SOLUTION: Freq: Once | INTRAVENOUS | Status: DC

## 2023-10-07 MED ORDER — HYDROMORPHONE HCL 1 MG/ML IJ SOLN: 0.2000 mg | INTRAMUSCULAR | Status: DC | PRN

## 2023-10-07 MED ORDER — PHENYLEPHRINE HCL-NACL 20-0.9 MG/250ML-% IV SOLN
25.0000 ug/min | INTRAVENOUS | Status: DC
  Administered 2023-10-07: 60 ug/min via INTRAVENOUS
  Administered 2023-10-07: 70 ug/min via INTRAVENOUS
  Filled 2023-10-07 (×2): qty 250

## 2023-10-07 MED ORDER — ACETAMINOPHEN 500 MG PO TABS
1000.0000 mg | ORAL_TABLET | Freq: Three times a day (TID) | ORAL | Status: DC
  Administered 2023-10-07 – 2023-10-15 (×21): 1000 mg via ORAL
  Filled 2023-10-07 (×22): qty 2

## 2023-10-07 MED ORDER — PHENYLEPHRINE HCL-NACL 20-0.9 MG/250ML-% IV SOLN: 0.0000 ug/min | INTRAVENOUS | Status: DC

## 2023-10-07 MED ORDER — SURGIFLO WITH THROMBIN (HEMOSTATIC MATRIX KIT) OPTIME
TOPICAL | Status: DC | PRN
Start: 2023-10-07 — End: 2023-10-07
  Administered 2023-10-07: 1 via TOPICAL

## 2023-10-07 MED ORDER — DEXAMETHASONE SODIUM PHOSPHATE 10 MG/ML IJ SOLN
INTRAMUSCULAR | Status: DC | PRN
  Administered 2023-10-07: 5 mg via INTRAVENOUS

## 2023-10-07 MED ORDER — NOREPINEPHRINE 4 MG/250ML-% IV SOLN
INTRAVENOUS | Status: AC
  Filled 2023-10-07: qty 250

## 2023-10-07 MED ORDER — PHENYLEPHRINE HCL-NACL 20-0.9 MG/250ML-% IV SOLN
INTRAVENOUS | Status: AC
  Filled 2023-10-07: qty 250

## 2023-10-07 MED ORDER — PROPOFOL 10 MG/ML IV BOLUS
INTRAVENOUS | Status: DC | PRN
  Administered 2023-10-07: 70 mg via INTRAVENOUS

## 2023-10-07 MED ORDER — FENTANYL CITRATE (PF) 100 MCG/2ML IJ SOLN
INTRAMUSCULAR | Status: DC | PRN
  Administered 2023-10-07: 50 ug via INTRAVENOUS
  Administered 2023-10-07: 25 ug via INTRAVENOUS

## 2023-10-07 MED ORDER — LIDOCAINE HCL (PF) 2 % IJ SOLN
INTRAMUSCULAR | Status: AC
  Filled 2023-10-07: qty 5

## 2023-10-07 MED ORDER — TRANEXAMIC ACID-NACL 1000-0.7 MG/100ML-% IV SOLN
INTRAVENOUS | Status: AC
  Filled 2023-10-07: qty 100

## 2023-10-07 MED ORDER — FLEET ENEMA RE ENEM: 1.0000 | ENEMA | Freq: Once | RECTAL | Status: DC | PRN

## 2023-10-07 MED ORDER — FENTANYL CITRATE (PF) 100 MCG/2ML IJ SOLN
INTRAMUSCULAR | Status: AC
  Filled 2023-10-07: qty 2

## 2023-10-07 MED ORDER — OXYCODONE HCL 5 MG/5ML PO SOLN: 5.0000 mg | Freq: Once | ORAL | Status: DC | PRN

## 2023-10-07 MED ORDER — ONDANSETRON HCL 4 MG/2ML IJ SOLN
4.0000 mg | Freq: Four times a day (QID) | INTRAMUSCULAR | Status: DC | PRN
  Administered 2023-10-07 – 2023-10-08 (×2): 4 mg via INTRAVENOUS
  Filled 2023-10-07 (×2): qty 2

## 2023-10-07 MED ORDER — CALCIUM CHLORIDE 10 % IV SOLN
INTRAVENOUS | Status: DC | PRN
  Administered 2023-10-07: 250 mg via INTRAVENOUS

## 2023-10-07 MED ORDER — CEFAZOLIN SODIUM-DEXTROSE 2-4 GM/100ML-% IV SOLN
INTRAVENOUS | Status: AC
  Filled 2023-10-07: qty 100

## 2023-10-07 MED ORDER — SODIUM CHLORIDE 0.9% IV SOLUTION: Freq: Once | INTRAVENOUS | Status: AC

## 2023-10-07 MED ORDER — CEFAZOLIN SODIUM-DEXTROSE 2-4 GM/100ML-% IV SOLN
2.0000 g | Freq: Once | INTRAVENOUS | Status: AC
  Administered 2023-10-07: 2 g via INTRAVENOUS

## 2023-10-07 MED ORDER — KETAMINE HCL 50 MG/5ML IJ SOSY
PREFILLED_SYRINGE | INTRAMUSCULAR | Status: AC
Start: 2023-10-07 — End: 2023-10-07
  Filled 2023-10-07: qty 5

## 2023-10-07 MED ORDER — ACETAMINOPHEN 10 MG/ML IV SOLN
INTRAVENOUS | Status: DC | PRN
Start: 2023-10-07 — End: 2023-10-07
  Administered 2023-10-07: 1000 mg via INTRAVENOUS

## 2023-10-07 MED ORDER — SENNOSIDES-DOCUSATE SODIUM 8.6-50 MG PO TABS
1.0000 | ORAL_TABLET | Freq: Every evening | ORAL | Status: DC | PRN
  Filled 2023-10-07: qty 1

## 2023-10-07 MED ORDER — 0.9 % SODIUM CHLORIDE (POUR BTL) OPTIME
TOPICAL | Status: DC | PRN
  Administered 2023-10-07: 500 mL

## 2023-10-07 MED ORDER — ALBUMIN HUMAN 5 % IV SOLN: INTRAVENOUS | Status: DC | PRN

## 2023-10-07 MED ORDER — PROPOFOL 10 MG/ML IV BOLUS
INTRAVENOUS | Status: AC
  Filled 2023-10-07: qty 20

## 2023-10-07 MED ORDER — ALBUTEROL SULFATE (2.5 MG/3ML) 0.083% IN NEBU
2.5000 mg | INHALATION_SOLUTION | Freq: Once | RESPIRATORY_TRACT | Status: AC
  Administered 2023-10-07: 2.5 mg via RESPIRATORY_TRACT

## 2023-10-07 MED ORDER — METOCLOPRAMIDE HCL 5 MG/ML IJ SOLN: 5.0000 mg | Freq: Three times a day (TID) | INTRAMUSCULAR | Status: DC | PRN

## 2023-10-07 MED ORDER — SODIUM CHLORIDE 0.9 % IV SOLN: INTRAVENOUS | Status: DC

## 2023-10-07 MED ORDER — BUPIVACAINE LIPOSOME 1.3 % IJ SUSP
INTRAMUSCULAR | Status: DC | PRN
  Administered 2023-10-07: 45 mL via INTRAMUSCULAR

## 2023-10-07 MED ORDER — ASPIRIN 325 MG PO TBEC
325.0000 mg | DELAYED_RELEASE_TABLET | Freq: Every day | ORAL | Status: DC
  Administered 2023-10-08 – 2023-10-13 (×6): 325 mg via ORAL
  Filled 2023-10-07 (×6): qty 1

## 2023-10-07 MED ORDER — OXYCODONE HCL 5 MG PO TABS: 5.0000 mg | ORAL_TABLET | ORAL | Status: DC | PRN

## 2023-10-07 MED ORDER — PHENYLEPHRINE HCL-NACL 20-0.9 MG/250ML-% IV SOLN
INTRAVENOUS | Status: DC | PRN
  Administered 2023-10-07: 40 ug/min via INTRAVENOUS

## 2023-10-07 SURGICAL SUPPLY — 32 items
BIT DRILL INTERTAN LAG SCREW (BIT) IMPLANT
BIT DRILL LONG 4.0 (BIT) IMPLANT
CHLORAPREP W/TINT 26 (MISCELLANEOUS) ×1 IMPLANT
DRAPE SHEET LG 3/4 BI-LAMINATE (DRAPES) ×1 IMPLANT
DRAPE U-SHAPE 47X51 STRL (DRAPES) ×2 IMPLANT
DRSG OPSITE POSTOP 3X4 (GAUZE/BANDAGES/DRESSINGS) ×3 IMPLANT
ELECTRODE REM PT RTRN 9FT ADLT (ELECTROSURGICAL) ×1 IMPLANT
GLOVE BIOGEL PI IND STRL 8 (GLOVE) ×1 IMPLANT
GLOVE SURG SYN 7.5 E (GLOVE) ×2 IMPLANT
GLOVE SURG SYN 7.5 PF PI (GLOVE) ×1 IMPLANT
GOWN SRG LRG LVL 4 IMPRV REINF (GOWNS) ×1 IMPLANT
GOWN SRG XL LONG LVL 3 NONREIN (GOWNS) ×1 IMPLANT
KIT PATIENT CARE HANA TABLE (KITS) ×1 IMPLANT
KIT TURNOVER CYSTO (KITS) ×1 IMPLANT
MANIFOLD NEPTUNE II (INSTRUMENTS) ×1 IMPLANT
MAT ABSORB FLUID 56X50 GRAY (MISCELLANEOUS) ×2 IMPLANT
NAIL TRIGEN INTERTAN 10X18CM (Nail) IMPLANT
NDL HYPO 22X1.5 SAFETY MO (MISCELLANEOUS) ×1 IMPLANT
NEEDLE HYPO 22X1.5 SAFETY MO (MISCELLANEOUS) ×1 IMPLANT
NS IRRIG 500ML POUR BTL (IV SOLUTION) ×1 IMPLANT
PACK HIP COMPR (MISCELLANEOUS) ×1 IMPLANT
PENCIL SMOKE EVACUATOR (MISCELLANEOUS) ×1 IMPLANT
PIN GUIDE 3.2X343MM (PIN) IMPLANT
SCREW LAG COMPR KIT 85/80 (Screw) IMPLANT
SCREW TRIGEN LOW PROF 5.0X32.5 (Screw) IMPLANT
STAPLER SKIN PROX 35W (STAPLE) ×1 IMPLANT
SURGIFLO W/THROMBIN 8M KIT (HEMOSTASIS) IMPLANT
SUT VIC AB 2-0 CT2 27 (SUTURE) ×1 IMPLANT
SYR 30ML LL (SYRINGE) ×1 IMPLANT
TAPE CLOTH 3X10 WHT NS LF (GAUZE/BANDAGES/DRESSINGS) ×2 IMPLANT
TRAP FLUID SMOKE EVACUATOR (MISCELLANEOUS) ×1 IMPLANT
WATER STERILE IRR 1000ML POUR (IV SOLUTION) ×1 IMPLANT

## 2023-10-07 NOTE — Assessment & Plan Note (Signed)
Patient declines nicotine patch. 

## 2023-10-07 NOTE — ED Notes (Signed)
 Pt complaining of muscle spasms in her left leg and reports this pain is 10/10. Medication requested from pharmacy an PRN pain meds will be administered as ordered in meantime. Pt made aware

## 2023-10-07 NOTE — Assessment & Plan Note (Signed)
 Last creatinine 1.34 in February 2025 Awaiting updated labs

## 2023-10-07 NOTE — Assessment & Plan Note (Addendum)
 Pain control N.p.o. Hold antiplatelets Further orders per Ortho Formal Ortho consult placed  EKG and chest x-ray benign.  Follow labs

## 2023-10-07 NOTE — ED Notes (Signed)
 Pt in CT.

## 2023-10-07 NOTE — Op Note (Signed)
 DATE OF SURGERY: 10/07/2023  PREOPERATIVE DIAGNOSIS: Left intertrochanteric hip fracture  POSTOPERATIVE DIAGNOSIS: Left intertrochanteric hip fracture  PROCEDURE: Intramedullary nailing of Left femur with cephalomedullary device  SURGEON: Earnestine HILARIO Blanch, MD  ANESTHESIA: Gen  EBL: 200 cc  IVF: per anesthesia record  COMPONENTS:  [employer] Intertan Short Nail: 10x137mm; 85mm lag screw with 80mm compression screw; 5x 32.5 mm distal cortical interlocking screw  INDICATIONS: Lisa Wilkerson is a 78 y.o. female who sustained an intertrochanteric fracture after a fall. Risks and benefits of intramedullary nailing were explained to the patient and/or family . Risks include but are not limited to bleeding, infection, injury to tissues, nerves, vessels, nonunion/malunion, hardware failure, limb length discrepancy/hip rotation mismatch and risks of anesthesia. The patient and/or family understand these risks, have completed an informed consent, and wish to proceed.   PROCEDURE:  The patient was brought into the operating room. After administering anesthesia, the patient was placed in the supine position on the Hana table. The uninjured leg was placed in an extended position while the injured lower extremity was placed in longitudinal traction. The fracture was reduced using longitudinal traction and internal rotation. The adequacy of reduction was verified fluoroscopically in AP and lateral projections and found to be acceptable. The lateral aspect of the hip and thigh were prepped with ChloraPrep solution before being draped sterilely. Preoperative IV antibiotics were administered. A timeout was performed to verify the appropriate surgical site, patient, and procedure.    The greater trochanter was identified and an approximately 6 cm incision was made about 3 fingerbreadths above the tip of the greater trochanter. The incision was carried down through the subcutaneous tissues to expose the  gluteal fascia. This was split the length of the incision, providing access to the tip of the trochanter. Under fluoroscopic guidance, a guidewire was drilled through the tip of the trochanter into the proximal metaphysis to the level of the lesser trochanter. After verifying its position fluoroscopically in AP and lateral projections, it was overreamed with the opening reamer to the level of the lesser trochanter. The nail was selected and advanced to the appropriate depth as verified fluoroscopically.    The guide system for the lag screw was positioned and advanced through an approximately 5cm incision over the lateral aspect of the proximal femur. The guidewire was drilled up through the femoral nail and into the femoral neck to rest within 5 mm of subchondral bone. After verifying its position in the femoral neck and head in both AP and lateral projections, the guidewire was measured and appropriate sized lag screw was selected.  The channel for the compression screw was drilled and antirotation bar was placed.  Lag screw was drilled and placed in appropriate position.  Compression screw was then placed.  Appropriate compression was achieved.  The set screw was locked in place. Again, the adequacy of hardware position and fracture reduction was verified fluoroscopically in AP and lateral projections.   Attention was then turned to the distal interlocking screw in the diaphysis. Using a targeted assembly, a stab incision was made and hole was drilled through the nail. An interlocking screw was placed with excellent purchase.  Appropriate screw position was verified fluoroscopically in AP and lateral projections.   The wounds were irrigated thoroughly with sterile saline solution. Local anesthetic was injected into the wounds. Deep fascia was closed with 0-Vicryl. The subcutaneous tissues were closed using 2-0 Vicryl interrupted sutures. The skin was closed using staples. Sterile occlusive dressings  were  applied to all wounds. The patient was then transferred to the recovery room in satisfactory condition.   POSTOPERATIVE PLAN: The patient will be WBAT on the operative extremity. ASA 325mg /day x 4 weeks + baseline Plavix  to start on POD#1. Perioperative IV antibiotics x 24 hours. PT/OT on POD#1.

## 2023-10-07 NOTE — Assessment & Plan Note (Signed)
 Likely secondary to acute fracture Chest x-ray clear We will get urinalysis

## 2023-10-07 NOTE — Progress Notes (Signed)
 1 unit of PRBC's started.

## 2023-10-07 NOTE — H&P (Addendum)
 History and Physical    Patient: Lisa Wilkerson DOB: July 02, 1945 DOA: 10/07/2023 DOS: the patient was seen and examined on 10/07/2023 PCP: Alla Amis, MD  Patient coming from: Home  Chief Complaint:  Chief Complaint  Patient presents with   Fall    HPI: Lisa Wilkerson is a 78 y.o. 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  She hit her head but did not lose consciousness.  She was previously in her usual state of health.  She received fentanyl  en route with EMS. In the ED, vitals within normal limits.  Labs pending at the time of admission.  EKG showing sinus at 69.  Trauma imaging which included CT head and C-spine and CT left hip as well as left knee x-ray and chest x-ray significant for comminuted intertrochanteric fracture proximal left femur.  The ED provider spoke with orthopedist, Dr. Tobie who recommended tranexamic acid  and admit to hospitalist.     Review of Systems: As mentioned in the history of present illness. All other systems reviewed and are negative.  Past Medical History:  Diagnosis Date   AAA (abdominal aortic aneurysm) without rupture (HCC)    a.) not candidate for endovascular repair per cardiology/vascular   Actinic keratosis 10/29/2014   L hand thenar - biopsy proven   Actinic keratosis 08/17/2017   L forearm - biopsy proven   Anterior communicating artery aneurysm (LEFT)    Aortic atherosclerosis (HCC)    Arthritis    Basal cell carcinoma of skin    Carotid stenosis    a.) s/p LEFT CEA 11/28/2015   CKD (chronic kidney disease), stage III (HCC)    COPD (chronic obstructive pulmonary disease) (HCC)     Coronary artery disease    DDD (degenerative disc disease), lumbosacral    Full dentures    History of 2019 novel coronavirus disease (COVID-19) 05/31/2023   History of bilateral cataract extraction    Hyperlipidemia    Hypertension    Left upper lobe pulmonary nodule    Long-term use of aspirin  therapy    NSTEMI (non-ST elevated myocardial infarction) (HCC) 07/2009   Peripheral vascular disease (HCC)    Psoriasis    Sleep apnea    a.) does not require nocturnal PAP therapy s/p UPPP   Squamous cell carcinoma of skin 07/13/2017   L hand dorsum SCCIS   Squamous cell carcinoma of skin 11/23/2017   L mid forearm    Squamous cell carcinoma of skin 01/07/2022   Right Shoulder - Posterior, [facility]   Squamous cell carcinoma of skin 09/07/2023   R hand dorsum, [facility]   Squamous cell skin cancer 06/18/2014   L lat lower leg below the knee   TIA (transient ischemic attack) 2014   Vertigo    Warthin's tumor (LEFT)    Past Surgical History:  Procedure Laterality Date   CATARACT EXTRACTION W/ INTRAOCULAR LENS IMPLANT Bilateral    ENDARTERECTOMY Left 11/28/2015   Procedure: ENDARTERECTOMY CAROTID;  Surgeon: Selinda GORMAN Gu, MD;  Location: [facility];  Service: Vascular;  Laterality: Left;   LEFT HEART CATH AND CORONARY ANGIOGRAPHY Left 07/12/2009   Procedure: LEFT HEART CATH AND CORONARY ANGIOGRAPHY; Location:  [facility]; Surgeon: Marsa Dooms. MD   MYRINGOTOMY WITH TUBE PLACEMENT Bilateral 08/19/2016   Procedure: MYRINGOTOMY WITH TUBE PLACEMENT;  Surgeon: Milissa Hamming, MD;  Location: [facility];  Service: ENT;  Laterality: Bilateral;  sleep apnea   MYRINGOTOMY WITH TUBE PLACEMENT Bilateral 08/04/2017   Procedure: MYRINGOTOMY WITH TUBE PLACEMENT           BUTTERFLY TUBES;  Surgeon: Milissa Hamming, MD;  Location: [facility];  Service: ENT;  Laterality: Bilateral;   MYRINGOTOMY WITH TUBE PLACEMENT Bilateral 06/24/2022   Procedure: MYRINGOTOMY WITH TUBE PLACEMENT;  Surgeon:  Milissa Hamming, MD;  Location: [facility];  Service: ENT;  Laterality: Bilateral;   UVULOPALATOPHARYNGOPLASTY  2000   VIDEO BRONCHOSCOPY WITH ENDOBRONCHIAL NAVIGATION N/A 09/03/2023   Procedure: VIDEO BRONCHOSCOPY WITH ENDOBRONCHIAL NAVIGATION;  Surgeon: Parris Manna, MD;  Location: [facility];  Service: Thoracic;  Laterality: N/A;   VIDEO BRONCHOSCOPY WITH ENDOBRONCHIAL ULTRASOUND N/A 09/03/2023   Procedure: BRONCHOSCOPY, WITH EBUS;  Surgeon: Parris Manna, MD;  Location: [facility];  Service: Thoracic;  Laterality: N/A;   Social History:  reports that she has been smoking cigarettes. She started smoking about 2 years ago. She has a 59.6 pack-year smoking history. She has never used smokeless tobacco. She reports that she does not drink alcohol and does not use drugs.  Allergies  Allergen Reactions   Codeine Other (See Comments)    Other: She states it paralyzes her and she can't move.    History reviewed. No pertinent family history.  Prior to Admission medications   Medication Sig Start Date End Date Taking? Authorizing Provider  albuterol  (PROVENTIL  HFA) 108 (90 Base) MCG/ACT inhaler Inhale 2 puffs into the lungs every 6 (six) hours as needed for wheezing. 05/24/23     amLODipine  (NORVASC ) 2.5 MG tablet Take 1 tablet (2.5 mg total) by mouth daily. Patient taking differently: Take 2.5 mg by mouth at bedtime. 06/21/23     amoxicillin -clavulanate (AUGMENTIN ) 875-125 MG tablet Take 1 tablet by mouth every 12 (twelve) hours. Patient not taking: Reported on 09/03/2023 08/16/23     aspirin  81 MG tablet Take 81 mg by mouth daily.    [provider]  atorvastatin  (LIPITOR ) 80 MG tablet Take 1 tablet (80 mg total) by mouth daily. Patient taking differently: Take 80 mg by mouth at bedtime. 06/15/23     Cholecalciferol 25 MCG (1000 UT) tablet Take 2,000 Units by mouth daily.    [provider]  clobetasol  ointment (TEMOVATE ) 0.05 % Apply topically as directed. Qd ti bid aa  psoriasis on body until clear, then prn flares, avoid face, groin, axilla 08/17/19   Jackquline Sawyer, MD  clopidogrel  (PLAVIX ) 75 MG tablet Take 1 tablet (75 mg total) by mouth daily. 09/24/23   Gollan, Timothy J, MD  clotrimazole -betamethasone  (LOTRISONE ) cream Apply topically 2 (two) times daily Patient taking differently: Apply 1 Application topically 2 (two) times daily as needed. 10/22/22     dexamethasone  (DECADRON ) 2 MG tablet Take 2 tablets (4 mg total) by mouth daily with breakfast for 20 days, THEN 1 tablet (2 mg total) daily with breakfast for 5 days, THEN 0.5 tablets (1 mg total) daily with breakfast for 5 days. 09/22/23 10/22/23    fluticasone -salmeterol (ADVAIR  HFA) 230-21 MCG/ACT inhaler Inhale 2 puffs into the lungs every 12 (twelve) hours. 06/15/23     ipratropium-albuterol  (DUONEB) 0.5-2.5 (3) MG/3ML SOLN Take 3 mLs by nebulization 4 (four) times daily as needed for Shortness of Breath Patient not taking: Reported on 09/09/2023  05/31/23     isosorbide  mononitrate (IMDUR ) 30 MG 24 hr tablet Take 1 tablet (30 mg total) by mouth daily. 03/15/23   Gollan, Timothy J, MD  lisinopril  (ZESTRIL ) 5 MG tablet Take 1 tablet (5 mg total) by mouth daily. 09/08/23   Gollan, Timothy J, MD  mirtazapine  (REMERON ) 15 MG tablet Take 1 tablet (15 mg total) by mouth Nightly. Patient taking differently: Take 15 mg by mouth daily. 06/21/23     Risankizumab -rzaa (SKYRIZI  PEN) 150 MG/ML SOAJ every 3 (three) months Patient not taking: Reported on 08/26/2023 05/01/21   [provider]  sulfamethoxazole -trimethoprim  (BACTRIM ) 400-80 MG tablet Take 1 tablet by mouth daily for 21 days. 09/22/23 10/13/23      Physical Exam: Vitals:   10/07/23 0345 10/07/23 0400 10/07/23 0430 10/07/23 0451  BP: 107/65 94/61 96/62    Pulse: (!) 58 61    Resp: 15 14 19    Temp:    98 F (36.7 C)  TempSrc:    Oral  SpO2: 100% 100%  98%  Weight:      Height:       Physical Exam Vitals and nursing note reviewed.  Constitutional:       General: She is not in acute distress.    Comments: Awake and alert and conversant.  Does not appear to be in pain or in distress  HENT:     Head: Normocephalic.     Comments: Small bruise under right eye Cardiovascular:     Rate and Rhythm: Normal rate and regular rhythm.     Heart sounds: Normal heart sounds.  Pulmonary:     Effort: Pulmonary effort is normal.     Breath sounds: Normal breath sounds.  Abdominal:     Palpations: Abdomen is soft.     Tenderness: There is no abdominal tenderness.  Neurological:     General: No focal deficit present.     Mental Status: Mental status is at baseline.     Labs on Admission: I have personally reviewed following labs and imaging studies  CBC: Recent Labs  Lab 10/07/23 0427  WBC 16.4*  NEUTROABS 12.4*  HGB 9.5*  HCT 30.4*  MCV 95.0  PLT 159   Basic Metabolic Panel: No results for input(s): NA, K, CL, CO2, GLUCOSE, BUN, CREATININE, CALCIUM , MG, PHOS in the last 168 hours. GFR: CrCl cannot be calculated (Patient's most recent lab result is older than the maximum 21 days allowed.). Liver Function Tests: No results for input(s): AST, ALT, ALKPHOS, BILITOT, PROT, ALBUMIN  in the last 168 hours. No results for input(s): LIPASE, AMYLASE in the last 168 hours. No results for input(s): AMMONIA in the last 168 hours. Coagulation Profile: Recent Labs  Lab 10/07/23 0427  INR 1.0   Cardiac Enzymes: No results for input(s): CKTOTAL, CKMB, CKMBINDEX, TROPONINI in the last 168 hours. BNP (last 3 results) No results for input(s): PROBNP in the last 8760 hours. HbA1C: No results for input(s): HGBA1C in the last 72 hours. CBG: No results for input(s): GLUCAP in the last 168 hours. Lipid Profile: No results for input(s): CHOL, HDL, LDLCALC, TRIG, CHOLHDL, LDLDIRECT in the last 72 hours. Thyroid  Function Tests: No results for input(s): TSH, T4TOTAL, FREET4, T3FREE,  THYROIDAB in the last 72 hours. Anemia Panel: No results for input(s): VITAMINB12, FOLATE, FERRITIN, TIBC, IRON, RETICCTPCT in the last 72 hours. Urine analysis:    Component Value Date/Time   COLORURINE YELLOW (A) 02/06/2018 1643   APPEARANCEUR HAZY (A) 02/06/2018 1643   LABSPEC 1.014  02/06/2018 1643   PHURINE 6.0 02/06/2018 1643   GLUCOSEU NEGATIVE 02/06/2018 1643   HGBUR NEGATIVE 02/06/2018 1643   BILIRUBINUR NEGATIVE 02/06/2018 1643   KETONESUR 5 (A) 02/06/2018 1643   PROTEINUR 100 (A) 02/06/2018 1643   NITRITE NEGATIVE 02/06/2018 1643   LEUKOCYTESUR NEGATIVE 02/06/2018 1643    Radiological Exams on Admission: DG Knee Left Port Result Date: 10/07/2023 CLINICAL DATA:  78 year old female status post fall with proximal left femur fracture. Preoperative exam. EXAM: PORTABLE LEFT KNEE - 1-2 VIEW COMPARISON:  Left femur radiographs and CT today. FINDINGS: Two views, including and angulated AP and conventional lateral. Visible mid and distal left femur appear intact. Maintained alignment at the left knee. Joint spaces are normal for age. Patella appears intact. No knee joint effusion is evident. Extensive calcified peripheral vascular disease. And medial and posterior left thigh soft tissue swelling likely related to the intramuscular hematoma seen by CT. IMPRESSION: 1. No acute fracture or dislocation identified about the left knee. 2. Thigh soft tissue swelling related to intramuscular hematoma partially visible by CT. Electronically Signed   By: VEAR Hurst M.D.   On: 10/07/2023 04:32   CT Hip Left Wo Contrast Result Date: 10/07/2023 CLINICAL DATA:  78 year old female status post fall with proximal left femur fracture. Preoperative exam. EXAM: CT OF THE LEFT HIP WITHOUT CONTRAST TECHNIQUE: Multidetector CT imaging of the left hip was performed according to the standard protocol. Multiplanar CT image reconstructions were also generated. RADIATION DOSE REDUCTION: This exam was performed  according to the departmental dose-optimization program which includes automated exposure control, adjustment of the mA and/or kV according to patient size and/or use of iterative reconstruction technique. COMPARISON:  Left hip radiographs 0234 hours today. FINDINGS: Iliofemoral calcified atherosclerosis. Vascular patency is not evaluated in the absence of IV contrast. Coarsely calcified left inguinal lymph nodes appear to be postinflammatory, are nonenlarged, but numerous (series 5, image 73). No pelvic sidewall hematoma identified. No free fluid identified in the visible left hemipelvis. Comminuted intertrochanteric fracture of the proximal left femur with impaction. Posterior displacement of the dominant greater trochanter fragment. Left femoral head remains normally located at the acetabulum. Osteopenic acetabulum and visible left hemipelvis appear to remain intact. Medial left thigh intramuscular hematoma tracking distally from the fracture site. IMPRESSION: 1. Comminuted intertrochanteric fracture of the proximal left femur with impaction and posterior displacement of the dominant greater trochanter fragment. 2. Medial left thigh intramuscular hematoma tracking distally from the fracture site. 3. Osteopenia. Left acetabulum and visible hemipelvis appear to remain intact. 4. Calcified iliofemoral atherosclerosis. And multiple small but densely calcified post granulomatous appearing left inguinal lymph nodes. Electronically Signed   By: VEAR Hurst M.D.   On: 10/07/2023 04:31   DG Chest Port 1 View Result Date: 10/07/2023 CLINICAL DATA:  78 year old female status post fall with proximal left femur fracture. Preoperative exam. EXAM: PORTABLE CHEST 1 VIEW COMPARISON:  Portable chest 09/03/2023 and earlier. FINDINGS: Portable AP supine view at 0349 hours. Calcified aortic atherosclerosis. Stable cardiomegaly and mediastinal contours. Hyperinflated lungs. Centrilobular emphysema demonstrated by CT in May. Bilateral  subpleural lung scarring redemonstrated. Chronic scarring at the right lateral costophrenic angle. No pneumothorax, pulmonary edema, pleural effusion or acute lung opacity. No acute osseous abnormality identified in the chest. IMPRESSION: 1.  Emphysema (ICD10-J43.9). No acute cardiopulmonary abnormality. 2. Cardiomegaly.  Aortic Atherosclerosis (ICD10-I70.0). Electronically Signed   By: VEAR Hurst M.D.   On: 10/07/2023 04:28   CT Head Wo Contrast Result Date: 10/07/2023 CLINICAL DATA:  Head trauma, minor (Age >= 65y); Neck trauma (Age >= 65y). EXAM: CT HEAD WITHOUT CONTRAST CT CERVICAL SPINE WITHOUT CONTRAST TECHNIQUE: Multidetector CT imaging of the head and cervical spine was performed following the standard protocol without intravenous contrast. Multiplanar CT image reconstructions of the cervical spine were also generated. RADIATION DOSE REDUCTION: This exam was performed according to the departmental dose-optimization program which includes automated exposure control, adjustment of the mA and/or kV according to patient size and/or use of iterative reconstruction technique. COMPARISON:  None Available. FINDINGS: CT HEAD FINDINGS Brain: No evidence of acute infarction, hemorrhage, hydrocephalus, extra-axial collection or mass lesion/mass effect. Patchy white matter hypodensities, compatible with chronic microvascular ischemic disease. Vascular: Calcific atherosclerosis. Skull: No acute fracture.  High left posterior scalp contusion. Sinuses/Orbits: Paranasal sinus mucosal thickening. Other: Similar chronic left parotid mass. CT CERVICAL SPINE FINDINGS Alignment: No substantial sagittal subluxation. Skull base and vertebrae: No acute fracture. No primary bone lesion or focal pathologic process. Osteopenia Soft tissues and spinal canal: No prevertebral fluid or swelling. No visible canal hematoma. 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  Potentially severe right foraminal stenosis at these levels. Upper chest: Emphysema.  Aortic atherosclerosis. IMPRESSION: 1. No evidence of acute intracranial abnormality. Left posterior scalp contusion. 2. No evidence of acute fracture or traumatic malalignment. 3. Potentially severe right foraminal stenosis C4-C5 and C5-C6. 4. Similar chronic left parotid mass. Electronically Signed   By: Gilmore GORMAN Molt M.D.   On: 10/07/2023 03:28   CT Cervical Spine Wo Contrast Result Date: 10/07/2023 CLINICAL DATA:  Head trauma, minor (Age >= 65y); Neck trauma (Age >= 65y). EXAM: CT HEAD WITHOUT CONTRAST CT CERVICAL SPINE WITHOUT CONTRAST TECHNIQUE: Multidetector CT imaging of the head and cervical spine was performed following the standard protocol without intravenous contrast. Multiplanar CT image reconstructions of the cervical spine were also generated. RADIATION DOSE REDUCTION: This exam was performed according to the departmental dose-optimization program which includes automated exposure control, adjustment of the mA and/or kV according to patient size and/or use of iterative reconstruction technique. COMPARISON:  None Available. FINDINGS: CT HEAD FINDINGS Brain: No evidence of acute infarction, hemorrhage, hydrocephalus, extra-axial collection or mass lesion/mass effect. Patchy white matter hypodensities, compatible with chronic microvascular ischemic disease. Vascular: Calcific atherosclerosis. Skull: No acute fracture.  High left posterior scalp contusion. Sinuses/Orbits: Paranasal sinus mucosal thickening. Other: Similar chronic left parotid mass. CT CERVICAL SPINE FINDINGS Alignment: No substantial sagittal subluxation. Skull base and vertebrae: No acute fracture. No primary bone lesion or focal pathologic process. Osteopenia Soft tissues and spinal canal: No prevertebral fluid or swelling. No visible canal hematoma. Disc levels: Degenerative disc disease posteriorly at C4-C5 and C5-C6 including disc height loss  and endplate spurring. Potentially severe right foraminal stenosis at these levels. Upper chest: Emphysema.  Aortic atherosclerosis. IMPRESSION: 1. No evidence of acute intracranial abnormality. Left posterior scalp contusion. 2. No evidence of acute fracture or traumatic malalignment. 3. Potentially severe right foraminal stenosis C4-C5 and C5-C6. 4. Similar chronic left parotid mass. Electronically Signed   By: Gilmore GORMAN Molt M.D.   On: 10/07/2023 03:28   DG Hip Unilat W or Wo Pelvis 2-3 Views Left Result Date: 10/07/2023 CLINICAL DATA:  Tripped and fell landing on left leg. EXAM: DG HIP (WITH OR WITHOUT PELVIS) 2-3V LEFT COMPARISON:  None Available. FINDINGS: Comminuted mildly displaced intertrochanteric fracture of the left femur. No dislocation of the femoral head. IMPRESSION: 1. Comminuted mildly displaced intertrochanteric fracture of the  left femur. Electronically Signed   By: Norman Gatlin M.D.   On: 10/07/2023 03:21   Data Reviewed for HPI: Relevant notes from primary care and specialist visits, past discharge summaries as available in EHR, including Care Everywhere. Prior diagnostic testing as pertinent to current admission diagnoses Updated medications and problem lists for reconciliation ED course, including vitals, labs, imaging, treatment and response to treatment Triage notes, nursing and pharmacy notes and ED provider's notes Notable results as noted above in HPI      Assessment and Plan: * Closed comminuted intertrochanteric fracture of proximal end of left femur, initial encounter (HCC) Pain control N.p.o. Hold antiplatelets Further orders per Ortho Formal Ortho consult placed  EKG and chest x-ray benign.  Follow labs  Hypotension Blood pressure trended down during workup in the ED Hold all antihypertensives NS bolus ordered Monitor H&H as outlined above  Anemia Thigh hematoma Possibility of acute blood loss anemia Hemoglobin 9.5, down from 13 about 9 months  prior CT showing medial left thigh intramuscular hematoma tracking distally from the fracture site Serial H&H and transfuse if needed Patient got tranexamic acid  in the ED We will get anemia panel  Leukocytosis Likely secondary to acute fracture Chest x-ray clear We will get urinalysis  Acute renal failure superimposed on stage 3b chronic kidney disease (HCC) Creatinine 1.91 up from baseline of 1.34 in February 2025 NS bolus ordered Avoid nephrotoxins Continue to monitor renal function  COPD, moderate (HCC) Not acutely exacerbated Albuterol  as needed Patient on the last few days of a 21-day course of tapering Dexamethasone  for recent bronchitic flare, prescribed by her pulmonologist-will discontinue in the setting of acute fracture and upcoming surgery  Tobacco use disorder Patient declines nicotine patch  Underweight (BMI < 18.5) Dietary consult  Stage 3b chronic kidney disease (HCC) Last creatinine 1.34 in February 2025 Awaiting updated labs  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  Started on XRT on 09/30/2023 No acute disease suspected  CAD S/P percutaneous coronary angioplasty No complaints of chest pain and EKG is nonacute Continue isosorbide  and atorvastatin  Will hold aspirin  for surgery this a.m.  History of TIA (transient ischemic attack) Continue statin.  Holding antiplatelets for surgery  Atherosclerosis of native arteries of extremity with intermittent claudication (HCC) AAA Carotid artery stenosis s/p endarterectomy Followed by vascular surgery Holding antiplatelet Continue statin  Essential hypertension, benign BP on the low side so will hold antihypertensive        DVT prophylaxis: SCD  Consults: Dr Earnestine Blanch  Advance Care Planning:   Code Status: Prior   Family Communication: none  Disposition Plan: Back to previous home environment  Severity of Illness: The appropriate patient status for this  patient is INPATIENT. Inpatient status is judged to be reasonable and necessary in order to provide the required intensity of service to ensure the patient's safety. The patient's presenting symptoms, physical exam findings, and initial radiographic and laboratory data in the context of their chronic comorbidities is felt to place them at high risk for further clinical deterioration. Furthermore, it is not anticipated that the patient will be medically stable for discharge from the hospital within 2 midnights of admission.   * I certify that at the point of admission it is my clinical judgment that the patient will require inpatient hospital care spanning beyond 2 midnights from the point of admission due to high intensity of service, high risk for further deterioration and high frequency of surveillance required.*  Author: Alishea Beaudin V  Cleatus, MD 10/07/2023 5:00 AM  For on call review [email or web address].

## 2023-10-07 NOTE — Assessment & Plan Note (Addendum)
 Not acutely exacerbated Albuterol  as needed Patient on the last few days of a 21-day course of tapering Dexamethasone  for recent bronchitic flare, prescribed by her pulmonologist-will discontinue in the setting of acute fracture and upcoming surgery

## 2023-10-07 NOTE — Assessment & Plan Note (Signed)
 AAA Carotid artery stenosis s/p endarterectomy Followed by vascular surgery Holding antiplatelet Continue statin

## 2023-10-07 NOTE — ED Provider Notes (Signed)
 [facility] Provider Note    Event Date/Time   First MD Initiated Contact with Patient 10/07/23 [phone]     (approximate)   History   Fall   HPI Lisa Wilkerson is a 78 y.o. female who presents after a mechanical fall.  She said she accidentally fell backwards and hit the back of her head but also landed on her left hip as she was returning to the bedroom after going to the bathroom.  She did not lose consciousness.  She takes aspirin  and Plavix  but no other blood thinners.  She said her head feels fine and she has no pain in her neck but her hip hurts severely.  She has no pain in her chest or abdomen.  She received fentanyl  en route to the hospital which helped.     Physical Exam   Triage Vital Signs: ED Triage Vitals  Encounter Vitals Group     BP 10/07/23 0200 (!) 144/78     Girls Systolic BP Percentile --      Girls Diastolic BP Percentile --      Boys Systolic BP Percentile --      Boys Diastolic BP Percentile --      Pulse Rate 10/07/23 0200 77     Resp 10/07/23 0200 18     Temp 10/07/23 0212 97.7 F (36.5 C)     Temp Source 10/07/23 0212 Oral     SpO2 10/07/23 0158 95 %     Weight 10/07/23 0201 43.1 kg (95 lb)     Height 10/07/23 0201 1.575 m (5' 2)     Head Circumference --      Peak Flow --      Pain Score 10/07/23 0201 4     Pain Loc --      Pain Education --      Exclude from Growth Chart --     Most recent vital signs: Vitals:   10/07/23 0505 10/07/23 0529  BP:  (!) 82/50  Pulse: 60 (!) 59  Resp: 15 15  Temp:    SpO2: 97% 99%    General: Awake, alert, in no distress if she is not moving but with significant pain with any amount of repositioning. CV:  Good peripheral perfusion.  Regular rate and rhythm.  Normal heart sounds. Resp:  Normal effort. Speaking easily and comfortably, no accessory muscle usage nor intercostal retractions.  Lungs clear to auscultation bilaterally. Abd:  No distention.  No tenderness to palpation of  the abdomen. Other:  Tenderness to palpation and manipulation of the left hip, both proximally and some tenderness down the left thigh.  No tenderness to palpation of the distal femur nor of the knee itself although it is difficult to range the knee without causing pain to the hip.  Compartments are soft and easily compressible.  Neurovascularly intact.  Leg is somewhat shortened and externally rotated.   ED Results / Procedures / Treatments   Labs (all labs ordered are listed, but only abnormal results are displayed) Labs Reviewed  BASIC METABOLIC PANEL WITH GFR - Abnormal; Notable for the following components:      Result Value   Glucose, Bld 102 (*)    BUN 43 (*)    Creatinine, Ser 1.91 (*)    Calcium  8.4 (*)    GFR, Estimated 27 (*)    All other components within normal limits  CBC WITH DIFFERENTIAL/PLATELET - Abnormal; Notable for the following components:   WBC 16.4 (*)  RBC 3.20 (*)    Hemoglobin 9.5 (*)    HCT 30.4 (*)    RDW 16.9 (*)    Neutro Abs 12.4 (*)    Monocytes Absolute 1.2 (*)    Abs Immature Granulocytes 0.15 (*)    All other components within normal limits  PROTIME-INR  VITAMIN B12  FOLATE  IRON AND TIBC  FERRITIN  RETICULOCYTES  HEMOGLOBIN  HEMOGLOBIN  TYPE AND SCREEN     EKG ED ECG REPORT I, Darleene Dome, the attending physician, personally viewed and interpreted this ECG.  Date: 10/07/2023 EKG Time: 2:02 AM Rate: 69 Rhythm: normal sinus rhythm QRS Axis: normal Intervals: normal ST/T Wave abnormalities: normal Narrative Interpretation: no evidence of acute ischemia    RADIOLOGY See ED course for details   PROCEDURES:  Critical Care performed: No  Procedures    IMPRESSION / MDM / ASSESSMENT AND PLAN / ED COURSE  I reviewed the triage vital signs and the nursing notes.                              Differential diagnosis includes, but is not limited to, femur fracture, pelvic fracture, dislocation, intracranial hemorrhage,  cervical spine injury.  Patient's presentation is most consistent with acute presentation with potential threat to life or bodily function.  Labs/studies ordered: Hip and pelvis x-rays, head CT, cervical spine CT, chest x-ray, left hip CT, left knee x-rays, CBC with differential, BMP, type and screen, pro time-INR  Interventions/Medications given:  Medications  ceFAZolin  (ANCEF ) IVPB 2g/100 mL premix (has no administration in time range)  atorvastatin  (LIPITOR) tablet 80 mg (has no administration in time range)  mirtazapine  (REMERON ) tablet 15 mg (has no administration in time range)  ipratropium-albuterol  (DUONEB) 0.5-2.5 (3) MG/3ML nebulizer solution 3 mL (has no administration in time range)  HYDROcodone-acetaminophen  (NORCO/VICODIN) 5-325 MG per tablet 1-2 tablet (has no administration in time range)  morphine  (PF) 2 MG/ML injection 2 mg (has no administration in time range)  methocarbamol (ROBAXIN) tablet 500 mg (has no administration in time range)    Or  methocarbamol (ROBAXIN) injection 500 mg (has no administration in time range)  0.9 %  sodium chloride  infusion (has no administration in time range)  morphine  (PF) 4 MG/ML injection 4 mg (4 mg Intravenous Given 10/07/23 0428)  ondansetron  (ZOFRAN ) injection 4 mg (4 mg Intravenous Given 10/07/23 0429)  tranexamic acid (CYKLOKAPRON) IVPB 1,000 mg (0 mg Intravenous Stopped 10/07/23 0505)  sodium chloride  0.9 % bolus 1,000 mL (1,000 mLs Intravenous New Bag/Given 10/07/23 0523)    (Note:  hospital course my include additional interventions and/or labs/studies not listed above.)   Patient well-appearing and awake and alert despite injury.  I independently viewed and interpreted the patient's hip and pelvis x-rays and she clearly has an intertrochanteric left femur fracture.  Radiology report confirmed the findings.  I will consult with orthopedic surgery.  I also independently viewed and interpreted her head CT and cervical spine CT and I see  no evidence of acute injury such as intracranial bleed or cervical spine fracture.  Radiology confirmed no acute findings.  Patient and family understand the need for hospitalization and surgery for her hip.  I will update them after speaking with orthopedics     Clinical Course as of 10/07/23 0540  Thu Oct 07, 2023  0347 Paging Dr. Tobie with orthopedic surgery [CF]  [phone] Consulted by phone with Dr. Tobie who reviewed the images.  He requested that I order a CT of the left hip for additional evaluation and agreed with the plan for hospitalist admission. [CF]  0503 DG Knee Left Port I independently viewed and interpreted the patient's knee x-rays and I see no evidence of distal femur fracture.  Radiology report agrees [CF]  0503 CT Hip Left Wo Contrast I independently viewed and interpreted the patient's hip CT scan and confirmed the fracture.  There is also a hematoma in the thigh and Dr. Tobie ordered TXA. [CF]  0503 I consulted by phone with the admitting hospitalist, and they will admit the patient - Dr. Cleatus. [CF]  0503 I independently viewed and interpreted the patient's chest x-ray(s), and I also reviewed the radiologist's report.  Chronic emphysema, no pneumonia.  Confirmed by radiology. [CF]    Clinical Course User Index [CF] Gordan Huxley, MD     FINAL CLINICAL IMPRESSION(S) / ED DIAGNOSES   Final diagnoses:  Closed displaced intertrochanteric fracture of left femur, initial encounter (HCC)  Hematoma of left thigh, initial encounter     Rx / DC Orders   ED Discharge Orders     None        Note:  This document was prepared using Dragon voice recognition software and may include unintentional dictation errors.   Gordan Huxley, MD 10/07/23 [phone]

## 2023-10-07 NOTE — Assessment & Plan Note (Signed)
 Dietary consult

## 2023-10-07 NOTE — Assessment & Plan Note (Signed)
 Continue statin.  Holding antiplatelets for surgery

## 2023-10-07 NOTE — Consult Note (Signed)
 NAME:  Lisa Wilkerson, MRN:  [id number], DOB:  [birth year], LOS: 0 ADMISSION DATE:  10/07/2023, CONSULTATION DATE:  10/07/2023 REFERRING MD:  Dr Dorinda, CHIEF COMPLAINT:  Hemorrhagic shock    Brief Pt Description / Synopsis:  78 y.o. 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  Status post Intramedullary nailing of Left Femur.  In PACU, course complicated by Hemorrhagic shock.  History of Present Illness:  Lisa Wilkerson is a 78 y.o. 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  She reported she tripped on her way back to bed from bathroom.  She hit her head but did not lose consciousness.  She reports prior to the fall she was in her usual state of health with no other complaints.  ED Course: Initial Vital Signs: Temperature 97.7 F, pulse 77, RR 18, BP 144/78, SpO2 95% Significant Labs: glucose 102, BUN 43, Creatinine 1.9, WBC 16.4, Hgb 9.5, Hct 30.4 Imaging CT Head & Cervical Spine w/o contrast>>IMPRESSION: 1. No evidence of acute intracranial abnormality. Left posterior scalp contusion. 2. No evidence of acute fracture or traumatic malalignment. 3. Potentially severe right foraminal stenosis C4-C5 and C5-C6. 4. Similar chronic left parotid mass. X-ray Left Knee>>IMPRESSION: 1. No acute fracture or dislocation identified about the left knee. 2. Thigh soft tissue swelling related to intramuscular hematoma partially visible by CT. CT Left  Hip>>IMPRESSION: 1. Comminuted intertrochanteric fracture of the proximal left femur with impaction and posterior displacement of the dominant greater trochanter fragment. 2. Medial left thigh intramuscular hematoma tracking distally from the fracture site. 3. Osteopenia. Left acetabulum and visible hemipelvis appear to remain intact. 4. Calcified iliofemoral atherosclerosis. And multiple small but densely calcified post granulomatous appearing left inguinal lymph nodes. Chest X-ray>>IMPRESSION: 1.  Emphysema (ICD10-J43.9). No acute cardiopulmonary abnormality. 2. Cardiomegaly.  Aortic Atherosclerosis (ICD10-I70.0).   TRH asked to admit for further workup and treatment.  Orthopedics was consulted.  Please see Significant Hospital Events section below for full detailed hospital course.   Pertinent  Medical History   Past Medical History:  Diagnosis Date   AAA (abdominal aortic aneurysm) without rupture (HCC)    a.) not candidate for endovascular repair per cardiology/vascular   Actinic keratosis 10/29/2014   L hand thenar - biopsy proven   Actinic keratosis 08/17/2017   L forearm - biopsy proven   Anterior communicating artery aneurysm (LEFT)    Aortic atherosclerosis (HCC)    Arthritis    Basal cell carcinoma of skin    Carotid stenosis    a.) s/p LEFT CEA 11/28/2015   CKD (chronic kidney disease), stage III (HCC)    COPD (chronic obstructive pulmonary disease) (HCC)    Coronary artery disease    DDD (degenerative disc disease), lumbosacral    Full dentures    History of 2019 novel coronavirus disease (COVID-19) 05/31/2023   History of bilateral cataract extraction    Hyperlipidemia    Hypertension    Left upper lobe pulmonary nodule    Long-term use of aspirin  therapy  NSTEMI (non-ST elevated myocardial infarction) (HCC) 07/2009   Peripheral vascular disease (HCC)    Psoriasis    Sleep apnea    a.) does not require nocturnal PAP therapy s/p UPPP   Squamous  cell carcinoma of skin 07/13/2017   L hand dorsum SCCIS   Squamous cell carcinoma of skin 11/23/2017   L mid forearm    Squamous cell carcinoma of skin 01/07/2022   Right Shoulder - Posterior, EDC   Squamous cell carcinoma of skin 09/07/2023   R hand dorsum, EDC   Squamous cell skin cancer 06/18/2014   L lat lower leg below the knee   TIA (transient ischemic attack) 2014   Vertigo    Warthin's tumor (LEFT)     Micro Data:  N/A  Antimicrobials:   Anti-infectives (From admission, onward)    Start     Dose/Rate Route Frequency Ordered Stop   10/07/23 1200  [MAR Hold]  ceFAZolin  (ANCEF ) IVPB 2g/100 mL premix        (MAR Hold since Thu 10/07/2023 at 1127.Hold Reason: Transfer to a Procedural area)   2 g 200 mL/hr over 30 Minutes Intravenous  Once 10/07/23 0357 10/07/23 1247       Significant Hospital Events: Including procedures, antibiotic start and stop dates in addition to other pertinent events   7/3: Presented to ED after fall at home, found to have Left intertrochanteric hip fracture.  Orthopedics consulted, TRH asked to admit.  Underwent intramedullary nailing of left femur.  Post-op in PACU increasingly hypotensive not appropriately responsive to Phenylephrine , labs revealed Hgb 5.8.  Ordered for 3 units pRBCs, transferred to ICU with PCCM consultation for hemorrhagic shock.   Obtaining CT abdomen & pelvis.  Found to have new thrombocytopenia, evaluate for DIC.  Interim History / Subjective:  As outlined above under Significant Hospital Events section  Objective   Blood pressure (!) 83/45, pulse 62, temperature (!) 97 F (36.1 C), resp. rate 14, height 5' 2 (1.575 m), weight 43.1 kg, SpO2 100%.        Intake/Output Summary (Last 24 hours) at 10/07/2023 1621 Last data filed at 10/07/2023 1435 Gross per 24 hour  Intake 3250 ml  Output 500 ml  Net 2750 ml   Filed Weights   10/07/23 0201  Weight: 43.1 kg    Examination: General: Acute on chronically ill appearing  female, laying in bed, on [place], in NAD HENT: Normocephalic, neck supple, no JVD Lungs: Clear breath sounds throughout, even, nonlabored, normal effort. Cardiovascular: RRR, s1s2, no M/R/G Abdomen: Soft, nontender, nondistended, no guarding or rebound tenderness Extremities: Left hip and Left thigh with honeycomb dressings which are clean, dry, and intact Neuro: Sleeping, arouses easily to voice, oriented x3, moves all extremities commands, no focal deficits noted, speech clear, pupils PERRL GU: Foley catheter in place draining yellow urine   Resolved Hospital Problem list     Assessment & Plan:   #Shock: Hemorrhagic PMHx: CAD s/p PCI, HTN, PAD, AAA, left ICA Stenosis s/p endarterectomy, TIA -Continuous cardiac monitoring -Maintain MAP >65 -IV fluids -Transfusions as indicated -Vasopressors as needed to maintain MAP goal -Trend lactic acid until normalized -Trend HS Troponin until peaked -Holding ASA & Plavix  until cleared by Ortho  #Acute Blood Loss Anemia due to ... #Left Thigh Intramuscular Hematoma from fracture site #Thrombocytopenia  -Monitor for S/Sx of bleeding -Trend CBC -SCD's for VTE Prophylaxis  -Transfuse for Hgb <8 ~ ordered for 3 units pRBC's postop on 7/3, will give 1 unit FFP -Check coags,  Fibrinogen, and peripheral smear to rule out DIC -Status post TXA x2 doses -Obtain CT Abdomen & Pelvis to rule out further sources of bleeding  #Left Intertrochanteric Hip Fracture, status post Intramedullary nailing of Left Femur on 7/3 -Orthopedics following, appreciate input ~ site assessments and activity as per Ortho -Pain control   #AKI on CKD Stage IIIb -Monitor I&O's / urinary output -Follow BMP -Ensure adequate renal perfusion -Avoid nephrotoxic agents as able -Replace electrolytes as indicated ~ Pharmacy following for assistance with electrolyte replacement -IV Fluids  #Leukocytosis, likely in setting of acute fracture -Monitor fever curve -Trend WBC's   -Follow cultures as above -Continue empiric Cefazolin  pending cultures & sensitivities -Chest X-ray without evidence of pneumonia, UA is pending  #COPD without acute exacerbation #Recent diagnosis of left upper lob Adenocarcinoma of the lung (started on XRT 09/30/23) PMHx: Tobacco use disorder -Supplemental O2 as needed to maintain O2 sats 88 to 92% -Follow intermittent Chest X-ray & ABG as needed -Bronchodilators & Pulmicort  nebs -IV Steroids -ABX as above -Diuresis as BP and renal function permits -Pulmonary toilet as able -Follow up with Oncology outpatient     Best Practice (right click and Reselect all SmartList Selections daily)   Diet/type: Regular consistency (see orders) DVT prophylaxis: SCD GI prophylaxis: N/A Lines: N/A Foley:  yes and is still needed Code Status:  full code Last date of multidisciplinary goals of care discussion [N/A]  7/3: Pt and family updated at bedside on plan of care.  Labs   CBC: Recent Labs  Lab 10/07/23 0427  WBC 16.4*  NEUTROABS 12.4*  HGB 9.5*  HCT 30.4*  MCV 95.0  PLT 159    Basic Metabolic Panel: Recent Labs  Lab 10/07/23 0427  NA 137  K 4.9  CL 104  CO2 25  GLUCOSE 102*  BUN 43*  CREATININE 1.91*  CALCIUM  8.4*   GFR: Estimated Creatinine Clearance: 16.5 mL/min (A) (by C-G formula based on SCr of 1.91 mg/dL (H)). Recent Labs  Lab 10/07/23 0427  WBC 16.4*    Liver Function Tests: No results for input(s): AST, ALT, ALKPHOS, BILITOT, PROT, ALBUMIN in the last 168 hours. No results for input(s): LIPASE, AMYLASE in the last 168 hours. No results for input(s): AMMONIA in the last 168 hours.  ABG    Component Value Date/Time   HCO3 25.8 02/06/2018 1746   ACIDBASEDEF 0.6 02/06/2018 1746   O2SAT 81.2 07/06/2016 2335     Coagulation Profile: Recent Labs  Lab 10/07/23 0427  INR 1.0    Cardiac Enzymes: No results for input(s): CKTOTAL, CKMB, CKMBINDEX, TROPONINI in the last  168 hours.  HbA1C: No results found for: HGBA1C  CBG: No results for input(s): GLUCAP in the last 168 hours.  Review of Systems:   Positives in BOLD: Gen: Denies fever, chills, weight change, fatigue, night sweats HEENT: Denies blurred vision, double vision, hearing loss, tinnitus, sinus congestion, rhinorrhea, sore throat, neck stiffness, dysphagia PULM: Denies shortness of breath, cough, sputum production, hemoptysis, wheezing CV: Denies chest pain, edema, orthopnea, paroxysmal nocturnal dyspnea, palpitations GI: Denies abdominal pain, nausea, vomiting, diarrhea, hematochezia, melena, constipation, change in bowel habits GU: Denies dysuria, hematuria, polyuria, oliguria, urethral discharge Endocrine: Denies hot or cold intolerance, polyuria, polyphagia or appetite change Derm: Denies rash, dry skin, scaling or peeling skin change Heme: Denies easy bruising, bleeding, bleeding gums Neuro: Denies headache, numbness, weakness, slurred speech, loss of memory or consciousness   Past Medical History:  She,  has a past medical history of AAA (  abdominal aortic aneurysm) without rupture (HCC), Actinic keratosis (10/29/2014), Actinic keratosis (08/17/2017), Anterior communicating artery aneurysm (LEFT), Aortic atherosclerosis (HCC), Arthritis, Basal cell carcinoma of skin, Carotid stenosis, CKD (chronic kidney disease), stage III (HCC), COPD (chronic obstructive pulmonary disease) (HCC), Coronary artery disease, DDD (degenerative disc disease), lumbosacral, Full dentures, History of 2019 novel coronavirus disease (COVID-19) (05/31/2023), History of bilateral cataract extraction, Hyperlipidemia, Hypertension, Left upper lobe pulmonary nodule, Long-term use of aspirin  therapy, NSTEMI (non-ST elevated myocardial infarction) (HCC) (07/2009), Peripheral vascular disease (HCC), Psoriasis, Sleep apnea, Squamous cell carcinoma of skin (07/13/2017), Squamous cell carcinoma of skin (11/23/2017), Squamous  cell carcinoma of skin (01/07/2022), Squamous cell carcinoma of skin (09/07/2023), Squamous cell skin cancer (06/18/2014), TIA (transient ischemic attack) (2014), Vertigo, and Warthin's tumor (LEFT).   Surgical History:   Past Surgical History:  Procedure Laterality Date   CATARACT EXTRACTION W/ INTRAOCULAR LENS IMPLANT Bilateral    ENDARTERECTOMY Left 11/28/2015   Procedure: ENDARTERECTOMY CAROTID;  Surgeon: Selinda GORMAN Gu, MD;  Location: [facility];  Service: Vascular;  Laterality: Left;   LEFT HEART CATH AND CORONARY ANGIOGRAPHY Left 07/12/2009   Procedure: LEFT HEART CATH AND CORONARY ANGIOGRAPHY; Location: [facility]; Surgeon: Marsa Dooms. MD   MYRINGOTOMY WITH TUBE PLACEMENT Bilateral 08/19/2016   Procedure: MYRINGOTOMY WITH TUBE PLACEMENT;  Surgeon: Milissa Hamming, MD;  Location: [facility];  Service: ENT;  Laterality: Bilateral;  sleep apnea   MYRINGOTOMY WITH TUBE PLACEMENT Bilateral 08/04/2017   Procedure: MYRINGOTOMY WITH TUBE PLACEMENT           BUTTERFLY TUBES;  Surgeon: Milissa Hamming, MD;  Location: [facility];  Service: ENT;  Laterality: Bilateral;   MYRINGOTOMY WITH TUBE PLACEMENT Bilateral 06/24/2022   Procedure: MYRINGOTOMY WITH TUBE PLACEMENT;  Surgeon: Milissa Hamming, MD;  Location: [facility];  Service: ENT;  Laterality: Bilateral;   UVULOPALATOPHARYNGOPLASTY  2000   VIDEO BRONCHOSCOPY WITH ENDOBRONCHIAL NAVIGATION N/A 09/03/2023   Procedure: VIDEO BRONCHOSCOPY WITH ENDOBRONCHIAL NAVIGATION;  Surgeon: Parris Manna, MD;  Location: [facility];  Service: Thoracic;  Laterality: N/A;   VIDEO BRONCHOSCOPY WITH ENDOBRONCHIAL ULTRASOUND N/A 09/03/2023   Procedure: BRONCHOSCOPY, WITH EBUS;  Surgeon: Parris Manna, MD;  Location: [facility];  Service: Thoracic;  Laterality: N/A;     Social History:   reports that she has been smoking cigarettes. She started smoking about 2 years ago. She has a 59.6 pack-year smoking history. She has never used  smokeless tobacco. She reports that she does not drink alcohol and does not use drugs.   Family History:  Her family history is not on file.   Allergies Allergies  Allergen Reactions   Codeine Other (See Comments)    Other: She states it paralyzes her and she can't move.     Home Medications  Prior to Admission medications   Medication Sig Start Date End Date Taking? Authorizing Provider  albuterol  (PROVENTIL  HFA) 108 (90 Base) MCG/ACT inhaler Inhale 2 puffs into the lungs every 6 (six) hours as needed for wheezing. 05/24/23     amLODipine  (NORVASC ) 2.5 MG tablet Take 1 tablet (2.5 mg total) by mouth daily. Patient taking differently: Take 2.5 mg by mouth at bedtime. 06/21/23     amoxicillin -clavulanate (AUGMENTIN ) 875-125 MG tablet Take 1 tablet by mouth every 12 (twelve) hours. Patient not taking: Reported on 09/03/2023 08/16/23     aspirin  81 MG tablet Take 81 mg by mouth daily.    [provider]  atorvastatin  (LIPITOR) 80 MG tablet Take 1 tablet (80 mg total) by mouth  daily. Patient taking differently: Take 80 mg by mouth at bedtime. 06/15/23     Cholecalciferol 25 MCG (1000 UT) tablet Take 2,000 Units by mouth daily.    [provider]  clobetasol  ointment (TEMOVATE ) 0.05 % Apply topically as directed. Qd ti bid aa psoriasis on body until clear, then prn flares, avoid face, groin, axilla 08/17/19   Jackquline Sawyer, MD  clopidogrel  (PLAVIX ) 75 MG tablet Take 1 tablet (75 mg total) by mouth daily. 09/24/23   Gollan, Timothy J, MD  clotrimazole -betamethasone  (LOTRISONE ) cream Apply topically 2 (two) times daily Patient taking differently: Apply 1 Application topically 2 (two) times daily as needed. 10/22/22     dexamethasone  (DECADRON ) 2 MG tablet Take 2 tablets (4 mg total) by mouth daily with breakfast for 20 days, THEN 1 tablet (2 mg total) daily with breakfast for 5 days, THEN 0.5 tablets (1 mg total) daily with breakfast for 5 days. 09/22/23 10/22/23     fluticasone -salmeterol (ADVAIR  HFA) 230-21 MCG/ACT inhaler Inhale 2 puffs into the lungs every 12 (twelve) hours. 06/15/23     ipratropium-albuterol  (DUONEB) 0.5-2.5 (3) MG/3ML SOLN Take 3 mLs by nebulization 4 (four) times daily as needed for Shortness of Breath Patient not taking: Reported on 09/09/2023 05/31/23     isosorbide  mononitrate (IMDUR ) 30 MG 24 hr tablet Take 1 tablet (30 mg total) by mouth daily. 03/15/23   Gollan, Timothy J, MD  lisinopril  (ZESTRIL ) 5 MG tablet Take 1 tablet (5 mg total) by mouth daily. 09/08/23   Gollan, Timothy J, MD  mirtazapine  (REMERON ) 15 MG tablet Take 1 tablet (15 mg total) by mouth Nightly. Patient taking differently: Take 15 mg by mouth daily. 06/21/23     Risankizumab -rzaa (SKYRIZI  PEN) 150 MG/ML SOAJ every 3 (three) months Patient not taking: Reported on 08/26/2023 05/01/21   [provider]  sulfamethoxazole -trimethoprim  (BACTRIM ) 400-80 MG tablet Take 1 tablet by mouth daily for 21 days. 09/22/23 10/13/23       Critical care time: 55 minutes     Inge Lecher, AGACNP-BC [facility] Prefer epic messenger for cross cover needs If after hours, please call E-link

## 2023-10-07 NOTE — Assessment & Plan Note (Signed)
 Recently diagnosed.  Started on XRT on 09/30/2023 No acute disease suspected

## 2023-10-07 NOTE — Progress Notes (Signed)
 Neo IV drip started.

## 2023-10-07 NOTE — Transfer of Care (Signed)
 Immediate Anesthesia Transfer of Care Note  Patient: Lisa Wilkerson  Procedure(s) Performed: FIXATION, FRACTURE, INTERTROCHANTERIC, WITH INTRAMEDULLARY ROD (Left: Hip)  Patient Location: PACU  Anesthesia Type:General  Level of Consciousness: responds to stimulation  Airway & Oxygen Therapy: Patient Spontanous Breathing and Patient connected to face mask oxygen  Post-op Assessment: Report given to RN and Post -op Vital signs reviewed and stable  Post vital signs: stable  Last Vitals:  Vitals Value Taken Time  BP 108/54 10/07/23 14:31  Temp    Pulse 63 10/07/23 14:36  Resp 9 10/07/23 14:36  SpO2 100 % 10/07/23 14:36  Vitals shown include unfiled device data.  Last Pain:  Vitals:   10/07/23 1115  TempSrc:   PainSc: 9       Patients Stated Pain Goal: 0 (10/07/23 0529)  Complications: No notable events documented.

## 2023-10-07 NOTE — Progress Notes (Signed)
 Initial Nutrition Assessment  DOCUMENTATION CODES:   Underweight  INTERVENTION:   -Once diet is advanced, add:   -MVI with minerals daily -Ensure Plus High Protein po BID, each supplement provides 350 kcal and 20 grams of protein   NUTRITION DIAGNOSIS:   Increased nutrient needs related to post-op healing as evidenced by estimated needs.  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  Pt admitted with lt femur fracture s/p fall.   Reviewed I/O's: +1.1 L x 24 hours and +1.1 L since admission  Pt unavailable at time of visit. RD unable to obtain further nutrition-related history or complete nutrition-focused physical exam at this time.    Per orthopedics notes, plan for surgery today. Pt is NPO in anticipation for procedure.   Pt was recently diagnosed with lt lung cancer; started radiation on 09/30/23.  Reviewed wt hx; pt has experienced a 6.9% wt loss over the past month, which is significant for time frame.   Given underweight status, significant wt loss, and recent cancer diagnosis, highly suspect pt with malnutrition, however, unable to identify at this time. Pt with increased nutritional needs for cancer treatments as well as post-operative healing; would benefit from addition of oral nutrition supplements.   Medications reviewed and include remeron  and 0.9% sodium chloride  infusion @ 100 ml/hr.   Labs reviewed:  CBGS: 104 (inpatient orders for glycemic control are none).    Diet Order:   Diet Order             Diet NPO time specified  Diet effective now                   EDUCATION NEEDS:   No education needs have been identified at this time  Skin:  Skin Assessment: Reviewed RN Assessment  Last BM:  Unknown  Height:   Ht Readings from Last 1 Encounters:  10/07/23 5' 2 (1.575 m)    Weight:   Wt Readings from Last 1 Encounters:  10/07/23 43.1 kg    Ideal Body Weight:  50 kg  BMI:  Body mass index is 17.38 kg/m.  Estimated Nutritional Needs:   Kcal:  1500-1700  Protein:  75-90 grams  Fluid:  1.5-1.7 L    Lisa Wilkerson, RD, LDN, CDCES Registered Dietitian III Certified Diabetes Care and Education Specialist If unable to reach this RD, please use RD Inpatient group chat on secure chat between hours of 8am-4 pm daily

## 2023-10-07 NOTE — Anesthesia Postprocedure Evaluation (Addendum)
 Anesthesia Post Note  Patient: Lisa Wilkerson  Procedure(s) Performed: FIXATION, FRACTURE, INTERTROCHANTERIC, WITH INTRAMEDULLARY ROD (Left: Hip)  Patient location during evaluation: PACU Anesthesia Type: General Level of consciousness: lethargic and responds to stimulation Pain management: pain level controlled Vital Signs Assessment: vitals unstable Respiratory status: spontaneous breathing, respiratory function stable, nonlabored ventilation and patient connected to nasal cannula oxygen Cardiovascular status: hypotension. Postop Assessment: no headache, no backache and no apparent nausea or vomiting Anesthetic complications: yes Comments: Patient increasingly hypotensive in PACU, not appropriately responsive to phenylephrine . Postop labs with Hgb 5.8, so immediately ordered 3 units pRBCs.  Additional PIVs placed.  Hospitalist notified and will consult ICU for further care.  Will continue to follow.   Encounter Notable Events  Notable Event Outcome Phase Comment  Hypotension Unresolved In Recovery Hypotension requiring phenylephrine  infusion and anemia requiring pRBC; ICU admission     Last Vitals:  Vitals:   10/07/23 1615 10/07/23 1630  BP: (!) 91/52 115/78  Pulse: 69 69  Resp: 10 12  Temp: (!) 35.7 C   SpO2: 92% 100%    Last Pain:  Vitals:   10/07/23 1615  TempSrc:   PainSc: 0-No pain                 Alfonso Ruths

## 2023-10-07 NOTE — Assessment & Plan Note (Signed)
 Creatinine 1.91 up from baseline of 1.34 in February 2025 NS bolus ordered Avoid nephrotoxins Continue to monitor renal function

## 2023-10-07 NOTE — Anesthesia Preprocedure Evaluation (Addendum)
 Anesthesia Evaluation  Patient identified by MRN, date of birth, ID band Patient awake    Reviewed: Allergy & Precautions, NPO status , Patient's Chart, lab work & pertinent test results  History of Anesthesia Complications Negative for: history of anesthetic complications  Airway Mallampati: III  TM Distance: <3 FB Neck ROM: full    Dental  (+) Upper Dentures, Lower Dentures   Pulmonary shortness of breath and with exertion, sleep apnea , COPD, Current Smoker and Patient abstained from smoking.   + rhonchi  + decreased breath sounds      Cardiovascular Exercise Tolerance: Good hypertension, (-) angina + CAD and + Past MI  Normal cardiovascular exam     Neuro/Psych TIA negative psych ROS   GI/Hepatic negative GI ROS, Neg liver ROS,neg GERD  ,,  Endo/Other  negative endocrine ROS    Renal/GU CRFRenal disease     Musculoskeletal   Abdominal   Peds  Hematology negative hematology ROS (+)   Anesthesia Other Findings Past Medical History: No date: AAA (abdominal aortic aneurysm) without rupture (HCC)     Comment:  a.) not candidate for endovascular repair per               cardiology/vascular 10/29/2014: Actinic keratosis     Comment:  L hand thenar - biopsy proven 08/17/2017: Actinic keratosis     Comment:  L forearm - biopsy proven No date: Anterior communicating artery aneurysm (LEFT) No date: Aortic atherosclerosis (HCC) No date: Arthritis No date: Basal cell carcinoma of skin No date: Carotid stenosis     Comment:  a.) s/p LEFT CEA 11/28/2015 No date: CKD (chronic kidney disease), stage III (HCC) No date: COPD (chronic obstructive pulmonary disease) (HCC) No date: Coronary artery disease No date: DDD (degenerative disc disease), lumbosacral No date: Full dentures 05/31/2023: History of 2019 novel coronavirus disease (COVID-19) No date: History of bilateral cataract extraction No date: Hyperlipidemia No  date: Hypertension No date: Left upper lobe pulmonary nodule No date: Long-term use of aspirin  therapy 07/2009: NSTEMI (non-ST elevated myocardial infarction) (HCC) No date: Peripheral vascular disease (HCC) No date: Psoriasis No date: Sleep apnea     Comment:  a.) does not require nocturnal PAP therapy s/p UPPP 07/13/2017: Squamous cell carcinoma of skin     Comment:  L hand dorsum SCCIS 11/23/2017: Squamous cell carcinoma of skin     Comment:  L mid forearm  01/07/2022: Squamous cell carcinoma of skin     Comment:  Right Shoulder - Posterior, EDC 09/07/2023: Squamous cell carcinoma of skin     Comment:  R hand dorsum, EDC 06/18/2014: Squamous cell skin cancer     Comment:  L lat lower leg below the knee 2014: TIA (transient ischemic attack) No date: Vertigo 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 MD 08/19/2016: MYRINGOTOMY WITH TUBE PLACEMENT; Bilateral     Comment:  Procedure:  MYRINGOTOMY WITH TUBE PLACEMENT;  Surgeon:               Milissa Hamming, MD;  Location: [facility];                Service: ENT;  Laterality: Bilateral;  sleep apnea 08/04/2017: MYRINGOTOMY WITH TUBE PLACEMENT; Bilateral     Comment:  Procedure: MYRINGOTOMY WITH TUBE PLACEMENT                         BUTTERFLY TUBES;  Surgeon: Milissa Hamming, MD;                Location: [facility];  Service: ENT;                Laterality: Bilateral; 06/24/2022: MYRINGOTOMY WITH TUBE PLACEMENT; Bilateral     Comment:  Procedure:  MYRINGOTOMY WITH TUBE PLACEMENT;  Surgeon:               Milissa Hamming, MD;  Location: [facility];                Service: ENT;  Laterality: Bilateral; 2000: UVULOPALATOPHARYNGOPLASTY 09/03/2023: VIDEO BRONCHOSCOPY WITH ENDOBRONCHIAL NAVIGATION; N/A     Comment:  Procedure: VIDEO BRONCHOSCOPY WITH ENDOBRONCHIAL               NAVIGATION;  Surgeon: Parris Manna, MD;  Location:               [facility];  Service: Thoracic;  Laterality: N/A; 09/03/2023: VIDEO BRONCHOSCOPY WITH ENDOBRONCHIAL ULTRASOUND; N/A     Comment:  Procedure: BRONCHOSCOPY, WITH EBUS;  Surgeon: Parris Manna, MD;  Location: [facility];  Service: Thoracic;                Laterality: N/A;  BMI    Body Mass Index: 17.38 kg/m      Reproductive/Obstetrics negative OB ROS                              Anesthesia Physical Anesthesia Plan  ASA: 3  Anesthesia Plan: General ETT   Post-op Pain Management:    Induction: Intravenous  PONV Risk Score and Plan: Ondansetron , Dexamethasone , Midazolam  and Treatment may vary due to age or medical condition  Airway Management Planned: Oral ETT  Additional Equipment:   Intra-op Plan:   Post-operative Plan: Extubation in OR  Informed Consent: I have reviewed the patients History and Physical, chart, labs and discussed the procedure including the risks, benefits and alternatives for the proposed anesthesia with the patient or authorized representative who has indicated his/her understanding and acceptance.     Dental Advisory Given  Plan Discussed with: Anesthesiologist, CRNA and Surgeon  Anesthesia Plan Comments: (Patient consented for risks of anesthesia including but not limited to:  - adverse reactions to medications - damage to eyes, teeth, lips or other oral mucosa - nerve damage due to positioning  - sore throat or hoarseness - Damage to heart, brain, nerves, lungs, other parts of body or loss of life  Patient  voiced understanding and assent.)         Anesthesia Quick Evaluation

## 2023-10-07 NOTE — Progress Notes (Signed)
 Unable to keep b/p's up. A total of 7 doses of neo given IV push by Dr Johnson/Anesthesiologist. CBC drawn and blood transfusion ordered.

## 2023-10-07 NOTE — ED Triage Notes (Signed)
 Pt presented to ED from home BIBA with c/o fall. States she was heading back to bed when she tripped and fell. States landed on left leg and also hit head. Denies LOC. On Plavix  and ASA. Pt states with movement, left leg pain 9/10. Pain 4/10 at this time. Pt received 4mg  zofran , 100mcg fentanyl , and 300ml NS. Pt A&Ox4. Lives at home with son.

## 2023-10-07 NOTE — Assessment & Plan Note (Addendum)
 Thigh hematoma Possibility of acute blood loss anemia Hemoglobin 9.5, down from 13 about 9 months prior CT showing medial left thigh intramuscular hematoma tracking distally from the fracture site Serial H&H and transfuse if needed Patient got tranexamic acid  in the ED We will get anemia panel

## 2023-10-07 NOTE — Assessment & Plan Note (Signed)
 BP on the low side so will hold antihypertensive

## 2023-10-07 NOTE — Progress Notes (Signed)
  Progress Note   Patient: Lisa Wilkerson FMW:[id number] DOB: [birth year] DOA: 10/07/2023     0 DOS: the patient was seen and examined on 10/07/2023   Nonbillable note  Brief hospital course: Lisa Wilkerson is a 78 y.o. 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  She hit her head but did not lose consciousness.  Patient was taken to the OR for left hip fracture repair today by orthopedics.  Postoperatively patient was noted to have hypotension with systolic of 60s to 80s not responsive to IV fluid and therefore placed on vasopressor support.  Repeat H&H showed hemoglobin of 5.8.  Case discussed with anesthesiologist on-call who recommended patient be admitted to the ICU subsequently ICU team was consulted.  I discussed the case with intensivist Dr. Malka who has agreed that patient will need ICU level of care. Patient is therefore being transferred to the ICU under ICU service.   Assessment and plan:  Acute blood loss anemia 3 units of packed RBC requested postoperatively Continue blood transfusion Monitor CBC closely  Other assessment and plan as listed in the admission H&P by Dr. Cleatus.  Subjective:  Patient was seen and examined at bedside this morning She complained of 8/10 pain involving the left hip before surgery. Denies nausea vomiting abdominal pain or chest pain  Physical Exam:  Vitals:   10/07/23 1551 10/07/23 1600 10/07/23 1615 10/07/23 1630  BP: (!) 68/47 (!) 45/27 (!) 91/52 115/78  Pulse: 71 71 69 69  Resp: 14 12 10 12   Temp:   (!) 96.3 F (35.7 C)   TempSrc:      SpO2: 96% 95% 92% 100%  Weight:      Height:        Data Reviewed:     Latest Ref Rng & Units 10/07/2023    4:27 AM 06/14/2023    2:11 PM 01/03/2023    1:58 PM  BMP  Glucose 70 - 99 mg/dL 897   888   BUN 8 - 23 mg/dL 43   30   Creatinine 9.55 - 1.00 mg/dL 8.08  8.29  8.49   Sodium 135 - 145 mmol/L 137   136   Potassium 3.5 - 5.1 mmol/L 4.9   4.3   Chloride 98 - 111 mmol/L 104   99   CO2 22 - 32 mmol/L 25   27   Calcium  8.9 - 10.3 mg/dL 8.4   9.3        Latest Ref Rng & Units 10/07/2023    4:27 AM 01/03/2023    1:58 PM 02/07/2018    5:41 AM  CBC  WBC 4.0 - 10.5 K/uL 16.4  10.0  5.4   Hemoglobin 12.0 - 15.0 g/dL 9.5  86.5  87.5   Hematocrit 36.0 - 46.0 % 30.4  41.0  38.8   Platelets 150 - 400 K/uL 159  223  254      Author: Drue ONEIDA Potter, MD 10/07/2023 4:54 PM  For on call review [email or web address].

## 2023-10-07 NOTE — Progress Notes (Signed)
 Full consult note and discussion with patient to follow.  Called by ED staff. Imaging reviewed.  - Plan for surgery later today, likely late morning/early afternoon.  - NPO until OR - Hold anticoagulation - Admit to Hospitalist team.

## 2023-10-07 NOTE — Assessment & Plan Note (Signed)
 No complaints of chest pain and EKG is nonacute Continue isosorbide  and atorvastatin  Will hold aspirin  for surgery this a.m.

## 2023-10-07 NOTE — ED Notes (Signed)
 Dr. Para March at bedside

## 2023-10-07 NOTE — ED Notes (Signed)
 Pt to surgery.

## 2023-10-07 NOTE — Assessment & Plan Note (Signed)
 Blood pressure trended down during workup in the ED Hold all antihypertensives NS bolus ordered Monitor H&H as outlined above

## 2023-10-07 NOTE — ED Notes (Signed)
 Dr Cleatus made aware of pt's abnormal vital signs, specifically, her hypotension of 75/56.

## 2023-10-07 NOTE — H&P (Signed)
 H&P reviewed. No significant changes noted.

## 2023-10-07 NOTE — Anesthesia Procedure Notes (Signed)
 Procedure Name: Intubation Date/Time: 10/07/2023 12:38 PM  Performed by: Gillermo Spruce I, CRNAPre-anesthesia Checklist: Patient identified, Emergency Drugs available, Suction available and Patient being monitored Patient Re-evaluated:Patient Re-evaluated prior to induction Oxygen Delivery Method: Circle system utilized Preoxygenation: Pre-oxygenation with 100% oxygen Induction Type: IV induction Ventilation: Mask ventilation without difficulty Laryngoscope Size: McGrath and 3 Grade View: Grade I Tube type: Oral Tube size: 7.0 mm Number of attempts: 1 Airway Equipment and Method: Stylet and Oral airway Placement Confirmation: ETT inserted through vocal cords under direct vision, positive ETCO2 and breath sounds checked- equal and bilateral Secured at: 19 cm Tube secured with: Tape Dental Injury: Teeth and Oropharynx as per pre-operative assessment

## 2023-10-07 NOTE — Consult Note (Signed)
 ORTHOPAEDIC CONSULTATION  REQUESTING PHYSICIAN: Dorinda Drue DASEN, MD  Chief Complaint:   L hip pain  History of Present Illness: Lisa Wilkerson is a 78 y.o. female who had a fall overnight when she tripped on her way back to bed from the bathroom.  The patient noted immediate hip pain and inability to ambulate.  The patient ambulates unassisted at baseline but had recently started using a cane occasionally.  The patient lives at home with one of her sons. Pain is worse with any sort of movement.  Imaging in the emergency department show a left intertrochanteric hip fracture.  Of note, she was recently diagnosed with lung cancer and is getting XRT for this. No known metastasis. She is on Plavix  + baby aspirin  carotid artery stenosis s/p endarterectomy + hx of TIA. Also has baseline CKD.  Past Medical History:  Diagnosis Date   AAA (abdominal aortic aneurysm) without rupture (HCC)    a.) not candidate for endovascular repair per cardiology/vascular   Actinic keratosis 10/29/2014   L hand thenar - biopsy proven   Actinic keratosis 08/17/2017   L forearm - biopsy proven   Anterior communicating artery aneurysm (LEFT)    Aortic atherosclerosis (HCC)    Arthritis    Basal cell carcinoma of skin    Carotid stenosis    a.) s/p LEFT CEA 11/28/2015   CKD (chronic kidney disease), stage III (HCC)    COPD (chronic obstructive pulmonary disease) (HCC)    Coronary artery disease    DDD (degenerative disc disease), lumbosacral    Full dentures    History of 2019 novel coronavirus disease (COVID-19) 05/31/2023   History of bilateral cataract extraction    Hyperlipidemia    Hypertension    Left upper lobe pulmonary nodule    Long-term use of aspirin  therapy    NSTEMI (non-ST elevated myocardial infarction) (HCC) 07/2009   Peripheral vascular disease (HCC)    Psoriasis    Sleep apnea    a.) does not require nocturnal PAP therapy  s/p UPPP   Squamous cell carcinoma of skin 07/13/2017   L hand dorsum SCCIS   Squamous cell carcinoma of skin 11/23/2017   L mid forearm    Squamous cell carcinoma of skin 01/07/2022   Right Shoulder - Posterior, [facility]   Squamous cell carcinoma of skin 09/07/2023   R hand dorsum, [facility]   Squamous cell skin cancer 06/18/2014   L lat lower leg below the knee   TIA (transient ischemic attack) 2014   Vertigo    Warthin's tumor (LEFT)    Past Surgical History:  Procedure Laterality Date   CATARACT EXTRACTION W/ INTRAOCULAR LENS IMPLANT Bilateral    ENDARTERECTOMY Left 11/28/2015   Procedure: ENDARTERECTOMY CAROTID;  Surgeon: Selinda GORMAN Gu, MD;  Location: [facility];  Service: Vascular;  Laterality: Left;   LEFT HEART CATH AND CORONARY ANGIOGRAPHY Left 07/12/2009   Procedure: LEFT HEART CATH AND CORONARY ANGIOGRAPHY; Location: [facility]; Surgeon: Marsa Dooms. MD   MYRINGOTOMY WITH TUBE PLACEMENT Bilateral 08/19/2016   Procedure: MYRINGOTOMY WITH TUBE PLACEMENT;  Surgeon: Milissa Hamming, MD;  Location: [facility];  Service: ENT;  Laterality: Bilateral;  sleep apnea   MYRINGOTOMY WITH TUBE PLACEMENT Bilateral 08/04/2017   Procedure: MYRINGOTOMY WITH TUBE PLACEMENT           BUTTERFLY TUBES;  Surgeon: Milissa Hamming, MD;  Location: [facility];  Service: ENT;  Laterality: Bilateral;   MYRINGOTOMY WITH TUBE PLACEMENT Bilateral 06/24/2022   Procedure: MYRINGOTOMY WITH  TUBE PLACEMENT;  Surgeon: Milissa Hamming, MD;  Location: [facility];  Service: ENT;  Laterality: Bilateral;   UVULOPALATOPHARYNGOPLASTY  2000   VIDEO BRONCHOSCOPY WITH ENDOBRONCHIAL NAVIGATION N/A 09/03/2023   Procedure: VIDEO BRONCHOSCOPY WITH ENDOBRONCHIAL NAVIGATION;  Surgeon: Parris Manna, MD;  Location: [facility];  Service: Thoracic;  Laterality: N/A;   VIDEO BRONCHOSCOPY WITH ENDOBRONCHIAL ULTRASOUND N/A 09/03/2023   Procedure: BRONCHOSCOPY, WITH EBUS;  Surgeon: Parris Manna, MD;  Location:  [facility];  Service: Thoracic;  Laterality: N/A;   Social History   Socioeconomic History   Marital status: Widowed    Spouse name: Not on file   Number of children: Not on file   Years of education: Not on file   Highest education level: Not on file  Occupational History   Not on file  Tobacco Use   Smoking status: Every Day    Current packs/day: 0.25    Average packs/day: 1 pack/day for 61.5 years (59.6 ttl pk-yrs)    Types: Cigarettes    Start date: 2023   Smokeless tobacco: Never  Vaping Use   Vaping status: Never Used  Substance and Sexual Activity   Alcohol use: No   Drug use: No   Sexual activity: Not Currently  Other Topics Concern   Not on file  Social History Narrative   Not on file   Social Drivers of Health   Financial Resource Strain: Low Risk  (05/14/2023)   Received from [facility]   Overall Financial Resource Strain (CARDIA)    Difficulty of Paying Living Expenses: Not hard at all  Food Insecurity: No Food Insecurity (10/07/2023)   Hunger Vital Sign    Worried About Running Out of Food in the Last Year: Never true    Ran Out of Food in the Last Year: Never true  Transportation Needs: No Transportation Needs (10/07/2023)   PRAPARE - Administrator, Civil Service (Medical): No    Lack of Transportation (Non-Medical): No  Physical Activity: Inactive (02/06/2018)   Exercise Vital Sign    Days of Exercise per Week: 0 days    Minutes of Exercise per Session: 0 min  Stress: No Stress Concern Present (02/06/2018)   [employer] of Occupational Health - Occupational Stress Questionnaire    Feeling of Stress : Not at all  Social Connections: Unknown (10/07/2023)   Social Connection and Isolation Panel    Frequency of Communication with Friends and Family: More than three times a week    Frequency of Social Gatherings with Friends and Family: More than three times a week    Attends Religious Services: Patient declined    Automotive engineer or Organizations: Patient declined    Attends Banker Meetings: Patient declined    Marital Status: Widowed   History reviewed. No pertinent family history. Allergies  Allergen Reactions   Codeine Other (See Comments)    Other: She states it paralyzes her and she can't move.   Prior to Admission medications   Medication Sig Start Date End Date Taking? Authorizing Provider  albuterol  (PROVENTIL  HFA) 108 (90 Base) MCG/ACT inhaler Inhale 2 puffs into the lungs every 6 (six) hours as needed for wheezing. 05/24/23     amLODipine  (NORVASC ) 2.5 MG tablet Take 1 tablet (2.5 mg total) by mouth daily. Patient taking differently: Take 2.5 mg by mouth at bedtime. 06/21/23     amoxicillin -clavulanate (AUGMENTIN ) 875-125 MG tablet Take 1 tablet by mouth every 12 (twelve) hours. Patient  not taking: Reported on 09/03/2023 08/16/23     aspirin  81 MG tablet Take 81 mg by mouth daily.    [provider]  atorvastatin  (LIPITOR) 80 MG tablet Take 1 tablet (80 mg total) by mouth daily. Patient taking differently: Take 80 mg by mouth at bedtime. 06/15/23     Cholecalciferol 25 MCG (1000 UT) tablet Take 2,000 Units by mouth daily.    [provider]  clobetasol  ointment (TEMOVATE ) 0.05 % Apply topically as directed. Qd ti bid aa psoriasis on body until clear, then prn flares, avoid face, groin, axilla 08/17/19   Jackquline Sawyer, MD  clopidogrel  (PLAVIX ) 75 MG tablet Take 1 tablet (75 mg total) by mouth daily. 09/24/23   Gollan, Timothy J, MD  clotrimazole -betamethasone  (LOTRISONE ) cream Apply topically 2 (two) times daily Patient taking differently: Apply 1 Application topically 2 (two) times daily as needed. 10/22/22     dexamethasone  (DECADRON ) 2 MG tablet Take 2 tablets (4 mg total) by mouth daily with breakfast for 20 days, THEN 1 tablet (2 mg total) daily with breakfast for 5 days, THEN 0.5 tablets (1 mg total) daily with breakfast for 5 days. 09/22/23 10/22/23     fluticasone -salmeterol (ADVAIR  HFA) 230-21 MCG/ACT inhaler Inhale 2 puffs into the lungs every 12 (twelve) hours. 06/15/23     ipratropium-albuterol  (DUONEB) 0.5-2.5 (3) MG/3ML SOLN Take 3 mLs by nebulization 4 (four) times daily as needed for Shortness of Breath Patient not taking: Reported on 09/09/2023 05/31/23     isosorbide  mononitrate (IMDUR ) 30 MG 24 hr tablet Take 1 tablet (30 mg total) by mouth daily. 03/15/23   Gollan, Timothy J, MD  lisinopril  (ZESTRIL ) 5 MG tablet Take 1 tablet (5 mg total) by mouth daily. 09/08/23   Gollan, Timothy J, MD  mirtazapine  (REMERON ) 15 MG tablet Take 1 tablet (15 mg total) by mouth Nightly. Patient taking differently: Take 15 mg by mouth daily. 06/21/23     Risankizumab -rzaa (SKYRIZI  PEN) 150 MG/ML SOAJ every 3 (three) months Patient not taking: Reported on 08/26/2023 05/01/21   [provider]  sulfamethoxazole -trimethoprim  (BACTRIM ) 400-80 MG tablet Take 1 tablet by mouth daily for 21 days. 09/22/23 10/13/23     Recent Labs    10/07/23 0427  WBC 16.4*  HGB 9.5*  HCT 30.4*  PLT 159  K 4.9  CL 104  CO2 25  BUN 43*  CREATININE 1.91*  GLUCOSE 102*  CALCIUM  8.4*  INR 1.0   DG Knee Left Port Result Date: 10/07/2023 CLINICAL DATA:  78 year old female status post fall with proximal left femur fracture. Preoperative exam. EXAM: PORTABLE LEFT KNEE - 1-2 VIEW COMPARISON:  Left femur radiographs and CT today. FINDINGS: Two views, including and angulated AP and conventional lateral. Visible mid and distal left femur appear intact. Maintained alignment at the left knee. Joint spaces are normal for age. Patella appears intact. No knee joint effusion is evident. Extensive calcified peripheral vascular disease. And medial and posterior left thigh soft tissue swelling likely related to the intramuscular hematoma seen by CT. IMPRESSION: 1. No acute fracture or dislocation identified about the left knee. 2. Thigh soft tissue swelling related to intramuscular hematoma  partially visible by CT. Electronically Signed   By: VEAR Hurst M.D.   On: 10/07/2023 04:32   CT Hip Left Wo Contrast Result Date: 10/07/2023 CLINICAL DATA:  78 year old female status post fall with proximal left femur fracture. Preoperative exam. EXAM: CT OF THE LEFT HIP WITHOUT CONTRAST TECHNIQUE: Multidetector CT imaging of the left  hip was performed according to the standard protocol. Multiplanar CT image reconstructions were also generated. RADIATION DOSE REDUCTION: This exam was performed according to the departmental dose-optimization program which includes automated exposure control, adjustment of the mA and/or kV according to patient size and/or use of iterative reconstruction technique. COMPARISON:  Left hip radiographs 0234 hours today. FINDINGS: Iliofemoral calcified atherosclerosis. Vascular patency is not evaluated in the absence of IV contrast. Coarsely calcified left inguinal lymph nodes appear to be postinflammatory, are nonenlarged, but numerous (series 5, image 73). No pelvic sidewall hematoma identified. No free fluid identified in the visible left hemipelvis. Comminuted intertrochanteric fracture of the proximal left femur with impaction. Posterior displacement of the dominant greater trochanter fragment. Left femoral head remains normally located at the acetabulum. Osteopenic acetabulum and visible left hemipelvis appear to remain intact. Medial left thigh intramuscular hematoma tracking distally from the fracture site. IMPRESSION: 1. Comminuted intertrochanteric fracture of the proximal left femur with impaction and posterior displacement of the dominant greater trochanter fragment. 2. Medial left thigh intramuscular hematoma tracking distally from the fracture site. 3. Osteopenia. Left acetabulum and visible hemipelvis appear to remain intact. 4. Calcified iliofemoral atherosclerosis. And multiple small but densely calcified post granulomatous appearing left inguinal lymph nodes.  Electronically Signed   By: VEAR Hurst M.D.   On: 10/07/2023 04:31   DG Chest Port 1 View Result Date: 10/07/2023 CLINICAL DATA:  78 year old female status post fall with proximal left femur fracture. Preoperative exam. EXAM: PORTABLE CHEST 1 VIEW COMPARISON:  Portable chest 09/03/2023 and earlier. FINDINGS: Portable AP supine view at 0349 hours. Calcified aortic atherosclerosis. Stable cardiomegaly and mediastinal contours. Hyperinflated lungs. Centrilobular emphysema demonstrated by CT in May. Bilateral subpleural lung scarring redemonstrated. Chronic scarring at the right lateral costophrenic angle. No pneumothorax, pulmonary edema, pleural effusion or acute lung opacity. No acute osseous abnormality identified in the chest. IMPRESSION: 1.  Emphysema (ICD10-J43.9). No acute cardiopulmonary abnormality. 2. Cardiomegaly.  Aortic Atherosclerosis (ICD10-I70.0). Electronically Signed   By: VEAR Hurst M.D.   On: 10/07/2023 04:28   CT Head Wo Contrast Result Date: 10/07/2023 CLINICAL DATA:  Head trauma, minor (Age >= 65y); Neck trauma (Age >= 65y). EXAM: CT HEAD WITHOUT CONTRAST CT CERVICAL SPINE WITHOUT CONTRAST TECHNIQUE: Multidetector CT imaging of the head and cervical spine was performed following the standard protocol without intravenous contrast. Multiplanar CT image reconstructions of the cervical spine were also generated. RADIATION DOSE REDUCTION: This exam was performed according to the departmental dose-optimization program which includes automated exposure control, adjustment of the mA and/or kV according to patient size and/or use of iterative reconstruction technique. COMPARISON:  None Available. FINDINGS: CT HEAD FINDINGS Brain: No evidence of acute infarction, hemorrhage, hydrocephalus, extra-axial collection or mass lesion/mass effect. Patchy white matter hypodensities, compatible with chronic microvascular ischemic disease. Vascular: Calcific atherosclerosis. Skull: No acute fracture.  High left  posterior scalp contusion. Sinuses/Orbits: Paranasal sinus mucosal thickening. Other: Similar chronic left parotid mass. CT CERVICAL SPINE FINDINGS Alignment: No substantial sagittal subluxation. Skull base and vertebrae: No acute fracture. No primary bone lesion or focal pathologic process. Osteopenia Soft tissues and spinal canal: No prevertebral fluid or swelling. No visible canal hematoma. 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 Potentially severe right foraminal stenosis at these levels. Upper chest: Emphysema.  Aortic atherosclerosis. IMPRESSION: 1. No evidence of acute intracranial abnormality. Left posterior scalp contusion. 2. No evidence of acute fracture or traumatic malalignment. 3. Potentially severe right foraminal  stenosis C4-C5 and C5-C6. 4. Similar chronic left parotid mass. Electronically Signed   By: Gilmore GORMAN Molt M.D.   On: 10/07/2023 03:28   CT Cervical Spine Wo Contrast Result Date: 10/07/2023 CLINICAL DATA:  Head trauma, minor (Age >= 65y); Neck trauma (Age >= 65y). EXAM: CT HEAD WITHOUT CONTRAST CT CERVICAL SPINE WITHOUT CONTRAST TECHNIQUE: Multidetector CT imaging of the head and cervical spine was performed following the standard protocol without intravenous contrast. Multiplanar CT image reconstructions of the cervical spine were also generated. RADIATION DOSE REDUCTION: This exam was performed according to the departmental dose-optimization program which includes automated exposure control, adjustment of the mA and/or kV according to patient size and/or use of iterative reconstruction technique. COMPARISON:  None Available. FINDINGS: CT HEAD FINDINGS Brain: No evidence of acute infarction, hemorrhage, hydrocephalus, extra-axial collection or mass lesion/mass effect. Patchy white matter hypodensities, compatible with chronic microvascular ischemic disease. Vascular: Calcific atherosclerosis. Skull: No  acute fracture.  High left posterior scalp contusion. Sinuses/Orbits: Paranasal sinus mucosal thickening. Other: Similar chronic left parotid mass. CT CERVICAL SPINE FINDINGS Alignment: No substantial sagittal subluxation. Skull base and vertebrae: No acute fracture. No primary bone lesion or focal pathologic process. Osteopenia Soft tissues and spinal canal: No prevertebral fluid or swelling. No visible canal hematoma. 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 Potentially severe right foraminal stenosis at these levels. Upper chest: Emphysema.  Aortic atherosclerosis. IMPRESSION: 1. No evidence of acute intracranial abnormality. Left posterior scalp contusion. 2. No evidence of acute fracture or traumatic malalignment. 3. Potentially severe right foraminal stenosis C4-C5 and C5-C6. 4. Similar chronic left parotid mass. Electronically Signed   By: Gilmore GORMAN Molt M.D.   On: 10/07/2023 03:28   DG Hip Unilat W or Wo Pelvis 2-3 Views Left Result Date: 10/07/2023 CLINICAL DATA:  Tripped and fell landing on left leg. EXAM: DG HIP (WITH OR WITHOUT PELVIS) 2-3V LEFT COMPARISON:  None Available. FINDINGS: Comminuted mildly displaced intertrochanteric fracture of the left femur. No dislocation of the femoral head. IMPRESSION: 1. Comminuted mildly displaced intertrochanteric fracture of the left femur. Electronically Signed   By: Norman Gatlin M.D.   On: 10/07/2023 03:21     Positive ROS: All other systems have been reviewed and were otherwise negative with the exception of those mentioned in the HPI and as above.  Physical Exam: BP (!) 142/71   Pulse 76   Temp (!) 97 F (36.1 C)   Resp 17   Ht 5' 2 (1.575 m)   Wt 43.1 kg   SpO2 98%   BMI 17.38 kg/m  General:  Alert, no acute distress Psychiatric:  Patient is competent for consent with normal mood and affect    Orthopedic Exam:  LLE: + DF/PF/EHL SILT grossly over  foot Foot wwp +Log roll/axial load   Imaging:  As above: L intertrochanteric hip fracture  Assessment/Plan: CHARNETTE YOUNKIN is a 78 y.o. female with a L intertrochanteric hip fracture  1. I discussed the various treatment options including both surgical and non-surgical management of the fracture with the patient and/or family (medical PoA). We discussed the high risk of perioperative complications due to patient's age, dementia, and other co-morbidities. After discussion of risks, benefits, and alternatives to surgery, the family and/or patient were in agreement to proceed with surgery. The goals of surgery would be to provide adequate pain relief and allow for mobilization. Plan for surgery is L hip cephalomedullary nailing today, 10/07/2023. 2. NPO  until OR 3. Hold anticoagulation in advance of OR   Earnestine Blanch   10/07/2023 12:22 PM

## 2023-10-08 ENCOUNTER — Inpatient Hospital Stay

## 2023-10-08 DIAGNOSIS — S72142A Displaced intertrochanteric fracture of left femur, initial encounter for closed fracture: Secondary | ICD-10-CM | POA: Diagnosis not present

## 2023-10-08 LAB — BASIC METABOLIC PANEL WITH GFR
Anion gap: 11 (ref 5–15)
BUN: 47 mg/dL — ABNORMAL HIGH (ref 8–23)
CO2: 23 mmol/L (ref 22–32)
Calcium: 7.8 mg/dL — ABNORMAL LOW (ref 8.9–10.3)
Chloride: 103 mmol/L (ref 98–111)
Creatinine, Ser: 2.65 mg/dL — ABNORMAL HIGH (ref 0.44–1.00)
GFR, Estimated: 18 mL/min — ABNORMAL LOW (ref >60)
Glucose, Bld: 82 mg/dL (ref 70–99)
Potassium: 5.1 mmol/L (ref 3.5–5.1)
Sodium: 137 mmol/L (ref 135–145)

## 2023-10-08 LAB — URINALYSIS, COMPLETE (UACMP) WITH MICROSCOPIC
Bilirubin Urine: NEGATIVE
Glucose, UA: NEGATIVE mg/dL
Ketones, ur: NEGATIVE mg/dL
Nitrite: NEGATIVE
Protein, ur: NEGATIVE mg/dL
RBC / HPF: >50 RBC/hpf (ref 0–5)
Specific Gravity, Urine: 1.018 (ref 1.005–1.030)
pH: 5 (ref 5.0–8.0)

## 2023-10-08 LAB — CBC
HCT: 24.7 % — ABNORMAL LOW (ref 36.0–46.0)
HCT: 22.1 % — ABNORMAL LOW (ref 36.0–46.0)
Hemoglobin: 8.4 g/dL — ABNORMAL LOW (ref 12.0–15.0)
Hemoglobin: 7.4 g/dL — ABNORMAL LOW (ref 12.0–15.0)
MCH: 31.8 pg (ref 26.0–34.0)
MCH: 31.5 pg (ref 26.0–34.0)
MCHC: 34 g/dL (ref 30.0–36.0)
MCHC: 33.5 g/dL (ref 30.0–36.0)
MCV: 93.6 fL (ref 80.0–100.0)
MCV: 94 fL (ref 80.0–100.0)
Platelets: 99 K/uL — ABNORMAL LOW (ref 150–400)
Platelets: 92 K/uL — ABNORMAL LOW (ref 150–400)
RBC: 2.64 MIL/uL — ABNORMAL LOW (ref 3.87–5.11)
RBC: 2.35 MIL/uL — ABNORMAL LOW (ref 3.87–5.11)
RDW: 15.9 % — ABNORMAL HIGH (ref 11.5–15.5)
RDW: 16.1 % — ABNORMAL HIGH (ref 11.5–15.5)
WBC: 24.4 K/uL — ABNORMAL HIGH (ref 4.0–10.5)
WBC: 26.5 K/uL — ABNORMAL HIGH (ref 4.0–10.5)
nRBC: 0.1 % (ref 0.0–0.2)
nRBC: 0.1 % (ref 0.0–0.2)

## 2023-10-08 LAB — PROCALCITONIN: Procalcitonin: 0.75 ng/mL

## 2023-10-08 LAB — FIBRINOGEN
Fibrinogen: 183 mg/dL — ABNORMAL LOW (ref 210–475)
Fibrinogen: 228 mg/dL (ref 210–475)

## 2023-10-08 LAB — RENAL FUNCTION PANEL
Albumin: 2.5 g/dL — ABNORMAL LOW (ref 3.5–5.0)
Anion gap: 11 (ref 5–15)
BUN: 44 mg/dL — ABNORMAL HIGH (ref 8–23)
CO2: 22 mmol/L (ref 22–32)
Calcium: 8.1 mg/dL — ABNORMAL LOW (ref 8.9–10.3)
Chloride: 107 mmol/L (ref 98–111)
Creatinine, Ser: 2.09 mg/dL — ABNORMAL HIGH (ref 0.44–1.00)
GFR, Estimated: 24 mL/min — ABNORMAL LOW (ref >60)
Glucose, Bld: 98 mg/dL (ref 70–99)
Phosphorus: 6.4 mg/dL — ABNORMAL HIGH (ref 2.5–4.6)
Potassium: 5.8 mmol/L — ABNORMAL HIGH (ref 3.5–5.1)
Sodium: 140 mmol/L (ref 135–145)

## 2023-10-08 LAB — LACTIC ACID, PLASMA
Lactic Acid, Venous: 4 mmol/L (ref 0.5–1.9)
Lactic Acid, Venous: 3.4 mmol/L (ref 0.5–1.9)
Lactic Acid, Venous: 3.5 mmol/L (ref 0.5–1.9)
Lactic Acid, Venous: 4.2 mmol/L (ref 0.5–1.9)

## 2023-10-08 LAB — PROTIME-INR
INR: 1.4 — ABNORMAL HIGH (ref 0.8–1.2)
INR: 1.2 (ref 0.8–1.2)
Prothrombin Time: 17.7 s — ABNORMAL HIGH (ref 11.4–15.2)
Prothrombin Time: 15.4 s — ABNORMAL HIGH (ref 11.4–15.2)

## 2023-10-08 LAB — CK: Total CK: 68 U/L (ref 38–234)

## 2023-10-08 LAB — GLUCOSE, CAPILLARY: Glucose-Capillary: 98 mg/dL (ref 70–99)

## 2023-10-08 MED ORDER — INSULIN ASPART 100 UNIT/ML IV SOLN
10.0000 [IU] | Freq: Once | INTRAVENOUS | Status: AC
  Administered 2023-10-08: 10 [IU] via INTRAVENOUS
  Filled 2023-10-08: qty 0.1

## 2023-10-08 MED ORDER — DEXTROSE 50 % IV SOLN
1.0000 | Freq: Once | INTRAVENOUS | Status: AC
  Administered 2023-10-08: 50 mL via INTRAVENOUS
  Filled 2023-10-08: qty 50

## 2023-10-08 MED ORDER — SODIUM ZIRCONIUM CYCLOSILICATE 5 G PO PACK
10.0000 g | PACK | Freq: Three times a day (TID) | ORAL | Status: AC
  Administered 2023-10-08 – 2023-10-09 (×5): 10 g via ORAL
  Filled 2023-10-08 (×5): qty 2

## 2023-10-08 MED ORDER — BOOST / RESOURCE BREEZE PO LIQD CUSTOM
1.0000 | Freq: Three times a day (TID) | ORAL | Status: DC
  Administered 2023-10-08 – 2023-10-13 (×12): 1 via ORAL
  Administered 2023-10-13: 237 mL via ORAL
  Administered 2023-10-13 – 2023-10-14 (×2): 1 via ORAL
  Administered 2023-10-14: 237 mL via ORAL

## 2023-10-08 NOTE — TOC Initial Note (Signed)
 Transition of Care [facility]) - Initial/Assessment Note    Patient Details  Name: Lisa Wilkerson MRN: [id number] Date of Birth: 1945/10/24  [facility] CM/SW Contact:    Lauraine JAYSON Carpen, LCSW Phone Number: 10/08/2023, 4:27 PM  Clinical Narrative:  CSW met with patient. No family at bedside. CSW introduced role and explained that therapy recommendations would be discussed. Patient will consider SNF. She initially did not want CSW to send out her referral because she said her insurance agent would do it. CSW explained that insurance agents do not get involved with SNF referral process. She agreed to referral being sent out as long as she would not lose her insurance agent. No further concerns. CSW will continue to follow patient for support and facilitate discharge to SNF once medically stable.           Expected Discharge Plan: Skilled Nursing Facility Barriers to Discharge: Continued Medical Work up   Patient Goals and CMS Choice            Expected Discharge Plan and Services     Post Acute Care Choice: Skilled Nursing Facility Living arrangements for the past 2 months: Single Family Home                                      Prior Living Arrangements/Services Living arrangements for the past 2 months: Single Family Home Lives with:: Adult Children Patient language and need for interpreter reviewed:: Yes Do you feel safe going back to the place where you live?: Yes      Need for Family Participation in Patient Care: Yes (Comment) Care giver support system in place?: Yes (comment) Current home services: DME Criminal Activity/Legal Involvement Pertinent to Current Situation/Hospitalization: No - Comment as needed  Activities of Daily Living   ADL Screening (condition at time of admission) Independently performs ADLs?: No Does the patient have a NEW difficulty with bathing/dressing/toileting/self-feeding that is expected to last >3 days?: Yes (Initiates  electronic notice to provider for possible OT consult) Does the patient have a NEW difficulty with getting in/out of bed, walking, or climbing stairs that is expected to last >3 days?: Yes (Initiates electronic notice to provider for possible PT consult) Does the patient have a NEW difficulty with communication that is expected to last >3 days?: No Is the patient deaf or have difficulty hearing?: Yes Does the patient have difficulty seeing, even when wearing glasses/contacts?: No Does the patient have difficulty concentrating, remembering, or making decisions?: No  Permission Sought/Granted Permission sought to share information with : Oceanographer granted to share information with : Yes, Verbal Permission Granted     Permission granted to share info w AGENCY: SNF's        Emotional Assessment Appearance:: Appears stated age Attitude/Demeanor/Rapport: Engaged Affect (typically observed): Appropriate Orientation: : Oriented to Self, Oriented to Place, Oriented to  Time, Oriented to Situation Alcohol / Substance Use: Not Applicable Psych Involvement: No (comment)  Admission diagnosis:  Closed displaced intertrochanteric fracture of left femur, initial encounter (HCC) [S72.142A] Closed left hip fracture, initial encounter (HCC) [S72.002A] Hematoma of left thigh, initial encounter [S70.12XA] Patient Active Problem List   Diagnosis Date Noted   CAD S/P percutaneous coronary angioplasty 10/07/2023   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) 10/07/2023   Closed comminuted intertrochanteric fracture of proximal end of left femur, initial encounter (HCC) 10/07/2023  Closed left hip fracture, initial encounter (HCC) 10/07/2023   Anemia 10/07/2023   Stage 3b chronic kidney disease (HCC) 10/07/2023   Underweight (BMI < 18.5) 10/07/2023   Hypotension 10/07/2023   Acute renal failure superimposed on stage 3b chronic kidney disease (HCC) 10/07/2023   Tobacco  use disorder 10/07/2023   Leukocytosis 10/07/2023   COPD, moderate (HCC) 10/22/2022   Atherosclerosis of native arteries of extremity with intermittent claudication (HCC) 09/26/2022   AAA (abdominal aortic aneurysm) without rupture (HCC) 06/26/2021   Acute bronchitis 02/06/2018   Hyperlipidemia 04/03/2016   Essential hypertension, benign 04/03/2016   Carotid stenosis 11/28/2015   History of TIA (transient ischemic attack) 07/31/2013   Benign essential hypertension 07/24/2009   PCP:  Alla Amis, MD Pharmacy:   [facility] - [facility] [address] [place] [address] Phone: [phone] Fax: [phone]  [facility] [address] [place] [address] Phone: [phone] Fax: [phone]     Social Drivers of Health (SDOH) Social History: SDOH Screenings   Food Insecurity: No Food Insecurity (10/07/2023)  Housing: Low Risk  (10/07/2023)  Transportation Needs: No Transportation Needs (10/07/2023)  Utilities: Not At Risk (10/07/2023)  Depression (PHQ2-9): Low Risk  (01/29/2019)  Financial Resource Strain: Low Risk  (05/14/2023)   Received from [facility]  Physical Activity: Inactive (02/06/2018)  Social Connections: Unknown (10/07/2023)  Stress: No Stress Concern Present (02/06/2018)  Tobacco Use: High Risk (10/07/2023)   SDOH Interventions:     Readmission Risk Interventions     No data to display

## 2023-10-08 NOTE — Progress Notes (Addendum)
 Subjective: 1 Day Post-Op Procedure(s) (LRB): FIXATION, FRACTURE, INTERTROCHANTERIC, WITH INTRAMEDULLARY ROD (Left) complicated by hemorrhagic shock.  Currently in the ICU. Patient reports pain as mild.   Patient is well, hemorrhagic shock.  Currently on pressors as needed, 3 units of pRBC ordered post op. PT and care management to assist with discharge planning. Negative for chest pain and shortness of breath Fever: no Gastrointestinal:Negative for nausea and vomiting Reports she is passing some gas this AM.  Objective: Vital signs in last 24 hours: Temp:  [92.1 F (33.4 C)-98.1 F (36.7 C)] 97.6 F (36.4 C) (07/04 0226) Pulse Rate:  [62-83] 75 (07/04 0400) Resp:  [8-24] 18 (07/04 0400) BP: (45-159)/(27-94) 114/94 (07/04 0400) SpO2:  [89 %-100 %] 96 % (07/04 0400)  Intake/Output from previous day:  Intake/Output Summary (Last 24 hours) at 10/08/2023 0833 Last data filed at 10/08/2023 0600 Gross per 24 hour  Intake 3927.86 ml  Output 612 ml  Net 3315.86 ml    Intake/Output this shift: No intake/output data recorded.  Labs: Recent Labs    10/07/23 0427 10/07/23 1510 10/07/23 2202 10/08/23 0616  HGB 9.5* 5.8* 9.6*  9.6* 8.4*   Recent Labs    10/07/23 2202 10/08/23 0616  WBC 16.1*  15.8* 24.4*  RBC 3.04*  3.05* 2.64*  HCT 29.0*  29.1* 24.7*  PLT 57*  58* 99*   Recent Labs    10/07/23 2202 10/08/23 0437  NA 138 140  K 5.1 5.8*  CL 108 107  CO2 19* 22  BUN 42* 44*  CREATININE 2.04* 2.09*  GLUCOSE 147* 98  CALCIUM  8.0* 8.1*   Recent Labs    10/07/23 2202 10/08/23 0616  INR 1.3* 1.4*     EXAM General - Patient is Alert, Appropriate, and Oriented Extremity - Honeycomb dressings intact to the left leg. Mild bloody drainage noted, no active bleeding or oozing from the sites. Thigh is swollen but compressible.  No rigidity noted. Intact to light touch to the left leg. Able to dorsiflex and plantarflex foot without pain. Negative homans. Pulses  intact to bilateral lower extremities.  Past Medical History:  Diagnosis Date   AAA (abdominal aortic aneurysm) without rupture (HCC)    a.) not candidate for endovascular repair per cardiology/vascular   Actinic keratosis 10/29/2014   L hand thenar - biopsy proven   Actinic keratosis 08/17/2017   L forearm - biopsy proven   Anterior communicating artery aneurysm (LEFT)    Aortic atherosclerosis (HCC)    Arthritis    Basal cell carcinoma of skin    Carotid stenosis    a.) s/p LEFT CEA 11/28/2015   CKD (chronic kidney disease), stage III (HCC)    COPD (chronic obstructive pulmonary disease) (HCC)    Coronary artery disease    DDD (degenerative disc disease), lumbosacral    Full dentures    History of 2019 novel coronavirus disease (COVID-19) 05/31/2023   History of bilateral cataract extraction    Hyperlipidemia    Hypertension    Left upper lobe pulmonary nodule    Long-term use of aspirin  therapy    NSTEMI (non-ST elevated myocardial infarction) (HCC) 07/2009   Peripheral vascular disease (HCC)    Psoriasis    Sleep apnea    a.) does not require nocturnal PAP therapy s/p UPPP   Squamous cell carcinoma of skin 07/13/2017   L hand dorsum SCCIS   Squamous cell carcinoma of skin 11/23/2017   L mid forearm    Squamous cell carcinoma of skin  01/07/2022   Right Shoulder - Posterior, [facility]   Squamous cell carcinoma of skin 09/07/2023   R hand dorsum, [facility]   Squamous cell skin cancer 06/18/2014   L lat lower leg below the knee   TIA (transient ischemic attack) 2014   Vertigo    Warthin's tumor (LEFT)     Assessment/Plan: 1 Day Post-Op Procedure(s) (LRB): FIXATION, FRACTURE, INTERTROCHANTERIC, WITH INTRAMEDULLARY ROD (Left) Principal Problem:   Closed comminuted intertrochanteric fracture of proximal end of left femur, initial encounter (HCC) Active Problems:   Carotid stenosis   Essential hypertension, benign   AAA (abdominal aortic aneurysm) without rupture (HCC)    Atherosclerosis of native arteries of extremity with intermittent claudication (HCC)   COPD, moderate (HCC)   History of TIA (transient ischemic attack)   CAD S/P percutaneous coronary angioplasty   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   Weight as of this encounter: 43.1 kg.  Labs and vitals reviewed. Hg 8.4 this AM, BP 114/94. Currently in ICU, pressors prn.   3 units of pRBC ordered for patient. Continue to monitor BP and hemoglobin.   Work on a BM. Up with therapy when able, most likely will need SNF at d/c. Resume home dose of Plavix  when stable.  DVT Prophylaxis - Aspirin  and Plavix  Weight-Bearing as tolerated to left leg  J. Gustavo Level, PA-C [facility] Orthopa[facility] 10/08/2023, 8:33 AM

## 2023-10-08 NOTE — Evaluation (Signed)
 Physical Therapy Evaluation Patient Details Name: Lisa Wilkerson MRN: [id number] DOB: 23-May-1945 Today's Date: 10/08/2023  History of Present Illness  Pt is a 78 year old female mitted with acute Left Intertrochanteric Hip Fracture post fall at home with acute blood loss anemia due to left intramuscular hematoma from the fracture site.  Status post Intramedullary nailing of Left Femur.  In PACU, course complicated by Hemorrhagic shock.    PMH significant for LUL Lung Adenocarcinoma (started on XRT 09/30/23), COPD, tobacco abuse, HTN, PAD, AAA, Left ICA stenosis s/p endarterectomy.   Clinical Impression  Pt admitted with above diagnosis. Pt currently with functional limitations due to the deficits listed below (see PT Problem List). Pt received upright in bed agreeable to PT/OT co-eval for pt/therapist safety and complexity of medical care. Pt and family report PTA being fully independent to mod-I with recent use of SPC due to 2-3 recent falls. Indep with ADL's/IADL's.   To date, pt with lowered BP readings at rest in bed. Pt has normal sensation to LT in LLE and adequate LLE mobility at ankle/knee/hip (in frontal plane). Pt has positive dizziness with supine to sitting EOB needing minA +2. Improves with sitting ~5 minutes EOB. Able to stand to RW with minA+2 to RW with LLE mildly anterior upon standing to offload LLE. Pt with good stability in standing to RW however limited in capacity due to worsening dizziness. Deferred further mobility due to dizziness. Returned to sitting and modA+2 to return to supine with all needs in reach. Pt reports symptoms improved return to supine. Pt with all needs in reach with heels offloaded. Pt will benefit from skilled PT services < 3 hours/day to address acute deficits.        If plan is discharge home, recommend the following: Two people to help with walking and/or transfers;A lot of help with bathing/dressing/bathroom;Assistance with cooking/housework;Assist for  transportation;Help with stairs or ramp for entrance   Can travel by private vehicle   No    Equipment Recommendations Other (comment) (TBD by next venue of care)  Recommendations for Other Services       Functional Status Assessment Patient has had a recent decline in their functional status and demonstrates the ability to make significant improvements in function in a reasonable and predictable amount of time.     Precautions / Restrictions Precautions Precautions: Fall Recall of Precautions/Restrictions: Intact Restrictions Weight Bearing Restrictions Per Provider Order: Yes LLE Weight Bearing Per Provider Order: Weight bearing as tolerated      Mobility  Bed Mobility Overal bed mobility: Needs Assistance Bed Mobility: Supine to Sit, Sit to Supine     Supine to sit: Min assist, +2 for physical assistance, +2 for safety/equipment Sit to supine: Mod assist, +2 for physical assistance, +2 for safety/equipment     Patient Response: Cooperative  Transfers Overall transfer level: Needs assistance Equipment used: Rolling walker (2 wheels) Transfers: Sit to/from Stand Sit to Stand: Min assist, +2 physical assistance, +2 safety/equipment           General transfer comment: unable to attempt steps due to reported dizziness    Ambulation/Gait               General Gait Details: deferred due to dizziness in standing  Stairs            Wheelchair Mobility     Tilt Bed Tilt Bed Patient Response: Cooperative  Modified Rankin (Stroke Patients Only)       Balance Overall balance  assessment: Needs assistance Sitting-balance support: Feet supported Sitting balance-Leahy Scale: Good     Standing balance support: Bilateral upper extremity supported, During functional activity, Reliant on assistive device for balance Standing balance-Leahy Scale: Fair                               Pertinent Vitals/Pain Pain Assessment Pain Assessment:  No/denies pain    Home Living Family/patient expects to be discharged to:: Private residence Living Arrangements: Children Available Help at Discharge: Family;Available 24 hours/day Type of Home: House Home Access: Ramped entrance (3 STE at side of house)       Home Layout: One level Home Equipment: Grab bars - tub/shower;Cane - single point Additional Comments: family has MD orders for rollator    Prior Function Prior Level of Function : Independent/Modified Independent;History of Falls (last six months)             Mobility Comments: 3 falls in the last 6 moths; started using a SPC last week due to unsteadiness ADLs Comments: MOD I-I in ADL/IADL     Extremity/Trunk Assessment   Upper Extremity Assessment Upper Extremity Assessment: Generalized weakness    Lower Extremity Assessment Lower Extremity Assessment: Generalized weakness;LLE deficits/detail LLE Deficits / Details: s/p L hip IMN. Good hip abd/add. Unable to SLR. LLE Sensation: WNL    Cervical / Trunk Assessment Cervical / Trunk Assessment: Normal  Communication   Communication Communication: No apparent difficulties    Cognition Arousal: Alert Behavior During Therapy: WFL for tasks assessed/performed   PT - Cognitive impairments: No apparent impairments                         Following commands: Intact       Cueing Cueing Techniques: Verbal cues     General Comments General comments (skin integrity, edema, etc.): BP inclined prior to mobility: 96/52, HR: 75 BPM. BP seated EOB: 76/51, HR: 78    Exercises General Exercises - Lower Extremity Ankle Circles/Pumps: AROM, Strengthening, Left, 5 reps, Supine Hip ABduction/ADduction: AROM, Strengthening, Left, 10 reps, Supine Other Exercises Other Exercises: Role of PT in acute setting, WB status, LE therex.   Assessment/Plan    PT Assessment Patient needs continued PT services  PT Problem List Decreased strength;Decreased  balance;Pain;Decreased range of motion;Decreased mobility;Decreased knowledge of use of DME;Cardiopulmonary status limiting activity;Decreased activity tolerance       PT Treatment Interventions DME instruction;Therapeutic activities;Gait training;Therapeutic exercise;Patient/family education;Stair training;Balance training;Functional mobility training;Neuromuscular re-education    PT Goals (Current goals can be found in the Care Plan section)  Acute Rehab PT Goals Patient Stated Goal: To return to PLOF PT Goal Formulation: With patient/family Time For Goal Achievement: 10/22/23 Potential to Achieve Goals: Good    Frequency 7X/week     Co-evaluation PT/OT/SLP Co-Evaluation/Treatment: Yes Reason for Co-Treatment: For patient/therapist safety;Complexity of the patient's impairments (multi-system involvement);To address functional/ADL transfers PT goals addressed during session: Mobility/safety with mobility;Proper use of DME;Strengthening/ROM OT goals addressed during session: ADL's and self-care       AM-PAC PT 6 Clicks Mobility  Outcome Measure Help needed turning from your back to your side while in a flat bed without using bedrails?: A Lot Help needed moving from lying on your back to sitting on the side of a flat bed without using bedrails?: A Lot Help needed moving to and from a bed to a chair (including a wheelchair)?: A Lot Help needed  standing up from a chair using your arms (e.g., wheelchair or bedside chair)?: A Little Help needed to walk in hospital room?: Total Help needed climbing 3-5 steps with a railing? : Total 6 Click Score: 11    End of Session Equipment Utilized During Treatment: Gait belt Activity Tolerance: Patient tolerated treatment well;Treatment limited secondary to medical complications (Comment) (orthostatic) Patient left: in bed;with call bell/phone within reach;with bed alarm set;with nursing/sitter in room;with SCD's reapplied;with family/visitor  present Nurse Communication: Mobility status PT Visit Diagnosis: Muscle weakness (generalized) (M62.81);Difficulty in walking, not elsewhere classified (R26.2);Other abnormalities of gait and mobility (R26.89);History of falling (Z91.81);Pain Pain - Right/Left: Left Pain - part of body: Hip (with mobility only)    Time: 9156-9085 PT Time Calculation (min) (ACUTE ONLY): 31 min   Charges:   PT Evaluation $PT Eval Moderate Complexity: 1 Mod PT Treatments $Therapeutic Activity: 8-22 mins PT General Charges $$ ACUTE PT VISIT: 1 Visit         Dorina HERO. Fairly IV, PT, DPT Physical Therapist- [place]  [facility] 10/08/2023, 10:45 AM

## 2023-10-08 NOTE — Evaluation (Signed)
 Occupational Therapy Evaluation Patient Details Name: Lisa Wilkerson MRN: [id number] DOB: [birth year] Today's Date: 10/08/2023   History of Present Illness   Pt is a 78 year old female mitted with acute Left Intertrochanteric Hip Fracture post fall at home with acute blood loss anemia due to left intramuscular hematoma from the fracture site.  Status post Intramedullary nailing of Left Femur.  In PACU, course complicated by Hemorrhagic shock.    PMH significant for LUL Lung Adenocarcinoma (started on XRT 09/30/23), COPD, tobacco abuse, HTN, PAD, AAA, Left ICA stenosis s/p endarterectomy     Clinical Impressions Chart reviewed, pt greeted semi supine in bed, agreeable to OT evaluation. PTA Pt is MOD I-I in ADL/IADL, recently started amb with SPC last week due to unsteadiness/falls. Pt presents with deficits in strength, endurance, activity tolerance, balance, affecting safe and optimal ADL completion. Bed mobility completed with MIN-MOD A +2,  extended sitting on edge of bed due to reported dizziness that is unchanged, pt performs STS with MIN A +2, unable to take steps up the bed due to continued worsening dizziness. MAX A required for LB dressing. Pt is performing ADL/functional mobility below PLOF, will benefit from acute OT to address functional deficits and to facilitate optimal ADL/functional mobility performance. Pt is returned to supine, BP 98/49 (MAP 65) HR 70s bpm in semi supine with family present. OT will continue follow.   Of note: Hr between 70-80 bpm during mobility, pt is symptomatic/dizzy with position changes with BP in sitting 83/56 (MAP 66).      If plan is discharge home, recommend the following:   A lot of help with walking and/or transfers;A lot of help with bathing/dressing/bathroom;Help with stairs or ramp for entrance;Assistance with cooking/housework     Functional Status Assessment   Patient has had a recent decline in their functional status and demonstrates the  ability to make significant improvements in function in a reasonable and predictable amount of time.     Equipment Recommendations   BSC/3in1;Tub/shower bench     Recommendations for Other Services         Precautions/Restrictions   Precautions Precautions: Fall Recall of Precautions/Restrictions: Intact Restrictions Weight Bearing Restrictions Per Provider Order: Yes LLE Weight Bearing Per Provider Order: Weight bearing as tolerated     Mobility Bed Mobility Overal bed mobility: Needs Assistance Bed Mobility: Supine to Sit, Sit to Supine     Supine to sit: Min assist, +2 for physical assistance, +2 for safety/equipment Sit to supine: Mod assist, +2 for physical assistance, +2 for safety/equipment        Transfers Overall transfer level: Needs assistance Equipment used: Rolling walker (2 wheels) Transfers: Sit to/from Stand Sit to Stand: Min assist, +2 physical assistance, +2 safety/equipment           General transfer comment: unable to attempt steps due to reported dizziness      Balance Overall balance assessment: Needs assistance Sitting-balance support: Feet supported Sitting balance-Leahy Scale: Good     Standing balance support: Bilateral upper extremity supported, During functional activity, Reliant on assistive device for balance Standing balance-Leahy Scale: Fair                             ADL either performed or assessed with clinical judgement   ADL Overall ADL's : Needs assistance/impaired Eating/Feeding: Set up;Sitting   Grooming: Supervision/safety;Set up;Sitting  Lower Body Dressing: Maximal assistance Lower Body Dressing Details (indicate cue type and reason): donn socks     Toileting- Clothing Manipulation and Hygiene: Maximal assistance Toileting - Clothing Manipulation Details (indicate cue type and reason): anticipate             Vision Patient Visual Report: No change from baseline        Perception         Praxis         Pertinent Vitals/Pain Pain Assessment Pain Assessment: No/denies pain     Extremity/Trunk Assessment Upper Extremity Assessment Upper Extremity Assessment: Generalized weakness   Lower Extremity Assessment Lower Extremity Assessment: LLE deficits/detail LLE Deficits / Details: s/p L hip IMN       Communication Communication Communication: No apparent difficulties   Cognition Arousal: Alert Behavior During Therapy: WFL for tasks assessed/performed Cognition: No apparent impairments                               Following commands: Intact       Cueing  General Comments   Cueing Techniques: Verbal cues  Pt reports dizziness with position chages, BP taken mutliple attempts with MAP between 60-65, 83/56 (MAP 66), HR to 80 bpm with mobility   Exercises Other Exercises Other Exercises: edu pt/family re: role of OT, role of rehab, discharge recommendations   Shoulder Instructions      Home Living Family/patient expects to be discharged to:: Private residence Living Arrangements: Children Available Help at Discharge: Family;Available 24 hours/day (initially) Type of Home: House Home Access: Ramped entrance (also has 3STE to the side, reports ramp isnt in great shape)     Home Layout: One level     Bathroom Shower/Tub: Chief Strategy Officer: Standard Bathroom Accessibility: Yes How Accessible: Accessible via walker Home Equipment: Grab bars - tub/shower;Cane - single point          Prior Functioning/Environment Prior Level of Function : Independent/Modified Independent;History of Falls (last six months)             Mobility Comments: 3 falls in the last 6 moths; started using a SPC last week due to unsteadiness ADLs Comments: MOD I-I in ADL/IADL    OT Problem List: Decreased strength;Decreased activity tolerance;Decreased knowledge of use of DME or AE;Decreased safety  awareness;Impaired balance (sitting and/or standing)   OT Treatment/Interventions: Self-care/ADL training;DME and/or AE instruction;Therapeutic activities;Balance training;Therapeutic exercise;Energy conservation;Patient/family education      OT Goals(Current goals can be found in the care plan section)   Acute Rehab OT Goals Patient Stated Goal: return to PLOF OT Goal Formulation: With patient/family Time For Goal Achievement: 10/22/23 Potential to Achieve Goals: Good ADL Goals Pt Will Perform Grooming: with modified independence;sitting;standing Pt Will Perform Lower Body Dressing: with modified independence;sitting/lateral leans;sit to/from stand Pt Will Transfer to Toilet: with modified independence;ambulating Pt Will Perform Toileting - Clothing Manipulation and hygiene: with modified independence;sit to/from stand;sitting/lateral leans   OT Frequency:  Min 2X/week    Co-evaluation PT/OT/SLP Co-Evaluation/Treatment: Yes Reason for Co-Treatment: For patient/therapist safety;Complexity of the patient's impairments (multi-system involvement);To address functional/ADL transfers   OT goals addressed during session: ADL's and self-care      AM-PAC OT 6 Clicks Daily Activity     Outcome Measure Help from another person eating meals?: None Help from another person taking care of personal grooming?: None Help from another person toileting, which includes using toliet, bedpan, or urinal?: A  Lot Help from another person bathing (including washing, rinsing, drying)?: A Lot Help from another person to put on and taking off regular upper body clothing?: A Little Help from another person to put on and taking off regular lower body clothing?: A Lot 6 Click Score: 17   End of Session Equipment Utilized During Treatment: Rolling walker (2 wheels);Gait belt;Oxygen Nurse Communication: Mobility status  Activity Tolerance: Patient tolerated treatment well Patient left: in bed;with call  bell/phone within reach;with bed alarm set  OT Visit Diagnosis: Other abnormalities of gait and mobility (R26.89);Muscle weakness (generalized) (M62.81);Unsteadiness on feet (R26.81)                Time: 9156-9085 OT Time Calculation (min): 31 min Charges:  OT General Charges $OT Visit: 1 Visit OT Evaluation $OT Eval High Complexity: 1 High  Therisa Sheffield, OTD OTR/L  10/08/23, 10:35 AM

## 2023-10-08 NOTE — Plan of Care (Signed)

## 2023-10-08 NOTE — [place] FL2 (Signed)
 [place]  MEDICAID FL2 LEVEL OF CARE FORM     IDENTIFICATION  Patient Name: Lisa Wilkerson Birthdate: [birth year] Sex: female Admission Date (Current Location): 10/07/2023  [facility] and [place] Number:  Chiropodist and Address:  [facility], [address] [place], [place] [address]      Provider Number: [id number]  Attending Physician Name and Address:  Dorinda Drue DASEN, MD  Relative Name and Phone Number:       Current Level of Care: Hospital Recommended Level of Care: Skilled Nursing Facility Prior Approval Number:    Date Approved/Denied:   PASRR Number: [id number]  Discharge Plan: SNF    Current Diagnoses: Patient Active Problem List   Diagnosis Date Noted   CAD S/P percutaneous coronary angioplasty 10/07/2023   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) 10/07/2023   Closed comminuted intertrochanteric fracture of proximal end of left femur, initial encounter (HCC) 10/07/2023   Closed left hip fracture, initial encounter (HCC) 10/07/2023   Anemia 10/07/2023   Stage 3b chronic kidney disease (HCC) 10/07/2023   Underweight (BMI < 18.5) 10/07/2023   Hypotension 10/07/2023   Acute renal failure superimposed on stage 3b chronic kidney disease (HCC) 10/07/2023   Tobacco use disorder 10/07/2023   Leukocytosis 10/07/2023   COPD, moderate (HCC) 10/22/2022   Atherosclerosis of native arteries of extremity with intermittent claudication (HCC) 09/26/2022   AAA (abdominal aortic aneurysm) without rupture (HCC) 06/26/2021   Acute bronchitis 02/06/2018   Hyperlipidemia 04/03/2016   Essential hypertension, benign 04/03/2016   Carotid stenosis 11/28/2015   History of TIA (transient ischemic attack) 07/31/2013   Benign essential hypertension 07/24/2009    Orientation RESPIRATION BLADDER Height & Weight     Self, Time, Situation, Place  O2 (Nasal Cannula 0.5 L.) Continent, Indwelling catheter Weight: 95 lb (43.1 kg) Height:  5' 2  (157.5 cm)  BEHAVIORAL SYMPTOMS/MOOD NEUROLOGICAL BOWEL NUTRITION STATUS   (None)  (None) Continent Diet (Regular)  AMBULATORY STATUS COMMUNICATION OF NEEDS Skin   Extensive Assist Verbally Bruising, Surgical wounds, Other (Comment) (Incision on left hip: [place]. Wound on left pretibial: No dressing listed.)                       Personal Care Assistance Level of Assistance  Bathing, Feeding, Dressing Bathing Assistance: Maximum assistance Feeding assistance: Limited assistance Dressing Assistance: Maximum assistance     Functional Limitations Info  Sight, Hearing, Speech Sight Info: Adequate Hearing Info: Adequate Speech Info: Adequate    SPECIAL CARE FACTORS FREQUENCY  PT (By licensed PT), OT (By licensed OT)     PT Frequency: 5 x week OT Frequency: 5 x week            Contractures Contractures Info: Not present    Additional Factors Info  Allergies, Code Status Code Status Info: Full code Allergies Info: Codeine           Current Medications (10/08/2023):  This is the current hospital active medication list Current Facility-Administered Medications  Medication Dose Route Frequency Provider Last Rate Last Admin   0.9 %  sodium chloride  infusion (Manually program via Guardrails IV Fluids)   Intravenous Once Vicci Camellia Glatter, MD       0.9 %  sodium chloride  infusion (Manually program via Guardrails IV Fluids)   Intravenous Once Keene, Jeremiah D, NP       0.9 %  sodium chloride  infusion (Manually program via Guardrails IV Fluids)   Intravenous Once Rust-Chester, Jenita  L, NP       acetaminophen  (TYLENOL ) tablet 1,000 mg  1,000 mg Oral Q8H Tobie Priest, MD   1,000 mg at 10/08/23 1450   aspirin  EC tablet 325 mg  325 mg Oral Daily Tobie Priest, MD   325 mg at 10/08/23 9156   atorvastatin  (LIPITOR ) tablet 80 mg  80 mg Oral Daily Tobie Priest, MD   80 mg at 10/08/23 9155   bisacodyl  (DULCOLAX) suppository 10 mg  10 mg Rectal Daily PRN Tobie Priest, MD        Chlorhexidine  Gluconate Cloth 2 % PADS 6 each  6 each Topical Daily Assaker, Darrin, MD   6 each at 10/08/23 0847   docusate sodium  (COLACE) capsule 100 mg  100 mg Oral BID Tobie Priest, MD   100 mg at 10/08/23 9155   HYDROmorphone  (DILAUDID ) injection 0.2-0.4 mg  0.2-0.4 mg Intravenous Q4H PRN Tobie Priest, MD       ipratropium-albuterol  (DUONEB) 0.5-2.5 (3) MG/3ML nebulizer solution 3 mL  3 mL Nebulization QID PRN Tobie Priest, MD       lactated ringers  infusion   Intravenous Continuous Rust-Chester, Jenita CROME, NP 75 mL/hr at 10/08/23 1452 New Bag at 10/08/23 1452   methocarbamol  (ROBAXIN ) tablet 500 mg  500 mg Oral Q6H PRN Tobie Priest, MD       Or   methocarbamol  (ROBAXIN ) injection 500 mg  500 mg Intravenous Q6H PRN Tobie Priest, MD       metoCLOPramide  (REGLAN ) tablet 5-10 mg  5-10 mg Oral Q8H PRN Tobie Priest, MD       Or   metoCLOPramide  (REGLAN ) injection 5-10 mg  5-10 mg Intravenous Q8H PRN Tobie Priest, MD       mirtazapine  (REMERON ) tablet 15 mg  15 mg Oral Daily Tobie Priest, MD   15 mg at 10/08/23 9156   multivitamin with minerals tablet 1 tablet  1 tablet Oral Daily Tobie Priest, MD   1 tablet at 10/08/23 9156   ondansetron  (ZOFRAN ) tablet 4 mg  4 mg Oral Q6H PRN Tobie Priest, MD       Or   ondansetron  (ZOFRAN ) injection 4 mg  4 mg Intravenous Q6H PRN Tobie Priest, MD   4 mg at 10/08/23 9549   oxyCODONE  (Oxy IR/ROXICODONE ) immediate release tablet 2.5-5 mg  2.5-5 mg Oral Q4H PRN Tobie Priest, MD       oxyCODONE  (Oxy IR/ROXICODONE ) immediate release tablet 5-10 mg  5-10 mg Oral Q4H PRN Tobie Priest, MD       senna-docusate (Senokot-S) tablet 1 tablet  1 tablet Oral QHS PRN Tobie Priest, MD       sodium phosphate  (FLEET) enema 1 enema  1 enema Rectal Once PRN Tobie Priest, MD       sodium zirconium cyclosilicate  (LOKELMA ) packet 10 g  10 g Oral TID Rust-Chester, Britton L, NP   10 g at 10/08/23 9157   traMADol  (ULTRAM ) tablet 50 mg  50 mg Oral Q6H PRN Tobie Priest, MD          Discharge Medications: Please see discharge summary for a list of discharge medications.  Relevant Imaging Results:  Relevant Lab Results:   Additional Information SS#: [id number]  Lauraine JAYSON Carpen, LCSW

## 2023-10-08 NOTE — Progress Notes (Signed)
 Patient not eating much and not taking in much fluids. New order for [facility]. Patient only put out 30ml urine today. Patients labs came back, Hemoglobin dropped to 7.4, WBC increased to 26.5. Made Dr. Dorinda aware of all.

## 2023-10-08 NOTE — Progress Notes (Signed)
 Progress Note   Patient: Lisa Wilkerson FMW:[id number] DOB: 11-01-1945 DOA: 10/07/2023     1 DOS: the patient was seen and examined on 10/08/2023   Brief hospital course: Lisa Wilkerson is a 78 y.o. 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  She hit her head but did not lose consciousness.  Patient was taken to the OR for left hip fracture repair by orthopedics on 10/07/2023.  Postoperatively patient was noted to have hypotension with systolic of 60s to 80s not responsive to IV fluid and therefore placed on vasopressor support.  Repeat H&H showed hemoglobin of 5.8.  Patient is currently off of vasopressor support.  The rest of management as outlined below    Assessment and Plan:   Acute blood loss anemia Thigh hematoma Status post 3 units of packed RBC transfusion Continue blood transfusion Monitor CBC closely Plan of care discussed with intensivist  * Closed comminuted intertrochanteric fracture of proximal end of left femur, initial encounter (HCC) Continue pain control PT OT on board Status post fracture repair by orthopedic surgery    Hemorrhagic shock-resolved Required vasopressor support Have now been weaned off vasopressor Continue to monitor blood pressure closely   Leukocytosis Likely secondary to acute fracture Chest x-ray clear We will get urinalysis, blood culture   Acute renal failure superimposed on stage 3b chronic kidney disease (HCC) Creatinine 1.91 up from baseline of 1.34 in February 2025 NS bolus ordered Avoid nephrotoxins Continue to monitor renal function   COPD, moderate (HCC) Not acutely exacerbated Albuterol  as  needed Patient on the last few days of a 21-day course of tapering Dexamethasone  for recent bronchitic flare, prescribed by her pulmonologist-will discontinue in the setting of acute fracture and upcoming surgery   Tobacco use disorder Patient declines nicotine patch   Underweight (BMI < 18.5) Dietary consult   Stage 3b chronic kidney disease (HCC) Last creatinine 1.34 in February 2025 Awaiting updated labs   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  Started on XRT on 09/30/2023 No acute disease suspected   CAD S/P percutaneous coronary angioplasty No complaints of chest pain and EKG is nonacute Continue atorvastatin  Will hold aspirin  for surgery this a.m.   History of TIA (transient ischemic attack) Continue statin.   Holding antiplatelets given acute bleeding   Atherosclerosis of native arteries of extremity with intermittent claudication (HCC) AAA Carotid artery stenosis s/p endarterectomy Followed by vascular surgery Holding antiplatelet Continue statin   Essential hypertension, benign Avoiding antihypertensives at this time   DVT prophylaxis: SCD   Consults: Dr Earnestine Blanch   Advance Care Planning:   Code Status: Prior    Family Communication: none   Disposition Plan: Back to previous home environment  Subjective:  Patient seen and examined at bedside this morning in the presence of the family Blood pressure improved Denies nausea vomiting abdominal pain chest pain cough Hip pain is improved  Physical Exam:  General: Elderly female laying in bed in no acute distress HENT:     Head: Normocephalic.  Cardiovascular:     Rate and Rhythm: Normal rate and regular rhythm.     Heart sounds: Normal heart sounds.  Pulmonary:  Effort: Pulmonary effort is normal.     Breath sounds: Normal breath sounds.  Abdominal:     Palpations: Abdomen is soft.     Tenderness: There is no abdominal tenderness.  Neurological:     General: No  focal deficit present.     Mental Status: Mental status is at baseline.  Data Reviewed: X-ray of the hip showing fracture of the left hip    Latest Ref Rng & Units 10/08/2023    6:16 AM 10/07/2023   10:02 PM 10/07/2023    3:10 PM  CBC  WBC 4.0 - 10.5 K/uL 24.4  15.8    16.1  13.3   Hemoglobin 12.0 - 15.0 g/dL 8.4  9.6    9.6  5.8   Hematocrit 36.0 - 46.0 % 24.7  29.1    29.0  18.2   Platelets 150 - 400 K/uL 99  58    57  90        Latest Ref Rng & Units 10/08/2023    1:22 PM 10/08/2023    4:37 AM 10/07/2023   10:02 PM  BMP  Glucose 70 - 99 mg/dL 82  98  852   BUN 8 - 23 mg/dL 47  44  42   Creatinine 0.44 - 1.00 mg/dL 7.34  7.90  7.95   Sodium 135 - 145 mmol/L 137  140  138   Potassium 3.5 - 5.1 mmol/L 5.1  5.8  5.1   Chloride 98 - 111 mmol/L 103  107  108   CO2 22 - 32 mmol/L 23  22  19    Calcium  8.9 - 10.3 mg/dL 7.8  8.1  8.0      Vitals:   10/08/23 0400 10/08/23 1025 10/08/23 1200 10/08/23 1300  BP: (!) 114/94 (!) 83/56  110/71  Pulse: 75 80  73  Resp: 18   14  Temp:   97.6 F (36.4 C)   TempSrc:   Oral   SpO2: 96% 96%  100%  Weight:      Height:         Time spent: 55 minutes  Author: Drue ONEIDA Potter, MD 10/08/2023 5:16 PM  For on call review [email or web address].

## 2023-10-09 DIAGNOSIS — S72142A Displaced intertrochanteric fracture of left femur, initial encounter for closed fracture: Secondary | ICD-10-CM | POA: Diagnosis not present

## 2023-10-09 LAB — RENAL FUNCTION PANEL
Albumin: 2.3 g/dL — ABNORMAL LOW (ref 3.5–5.0)
Anion gap: 10 (ref 5–15)
BUN: 51 mg/dL — ABNORMAL HIGH (ref 8–23)
CO2: 23 mmol/L (ref 22–32)
Calcium: 7.6 mg/dL — ABNORMAL LOW (ref 8.9–10.3)
Chloride: 102 mmol/L (ref 98–111)
Creatinine, Ser: 2.46 mg/dL — ABNORMAL HIGH (ref 0.44–1.00)
GFR, Estimated: 20 mL/min — ABNORMAL LOW (ref >60)
Glucose, Bld: 92 mg/dL (ref 70–99)
Phosphorus: 6 mg/dL — ABNORMAL HIGH (ref 2.5–4.6)
Potassium: 5.4 mmol/L — ABNORMAL HIGH (ref 3.5–5.1)
Sodium: 135 mmol/L (ref 135–145)

## 2023-10-09 LAB — CBC
HCT: 22.5 % — ABNORMAL LOW (ref 36.0–46.0)
HCT: 20.6 % — ABNORMAL LOW (ref 36.0–46.0)
Hemoglobin: 7.5 g/dL — ABNORMAL LOW (ref 12.0–15.0)
Hemoglobin: 6.8 g/dL — ABNORMAL LOW (ref 12.0–15.0)
MCH: 31 pg (ref 26.0–34.0)
MCH: 30.9 pg (ref 26.0–34.0)
MCHC: 33.3 g/dL (ref 30.0–36.0)
MCHC: 33 g/dL (ref 30.0–36.0)
MCV: 93 fL (ref 80.0–100.0)
MCV: 93.6 fL (ref 80.0–100.0)
Platelets: 78 K/uL — ABNORMAL LOW (ref 150–400)
Platelets: 84 K/uL — ABNORMAL LOW (ref 150–400)
RBC: 2.42 MIL/uL — ABNORMAL LOW (ref 3.87–5.11)
RBC: 2.2 MIL/uL — ABNORMAL LOW (ref 3.87–5.11)
RDW: 16 % — ABNORMAL HIGH (ref 11.5–15.5)
RDW: 16.1 % — ABNORMAL HIGH (ref 11.5–15.5)
WBC: 27.1 K/uL — ABNORMAL HIGH (ref 4.0–10.5)
WBC: 25.8 K/uL — ABNORMAL HIGH (ref 4.0–10.5)
nRBC: 0.1 % (ref 0.0–0.2)
nRBC: 0.1 % (ref 0.0–0.2)

## 2023-10-09 LAB — PROTIME-INR
INR: 1.2 (ref 0.8–1.2)
INR: 1.1 (ref 0.8–1.2)
Prothrombin Time: 16.1 s — ABNORMAL HIGH (ref 11.4–15.2)
Prothrombin Time: 15.3 s — ABNORMAL HIGH (ref 11.4–15.2)

## 2023-10-09 LAB — FIBRINOGEN
Fibrinogen: 304 mg/dL (ref 210–475)
Fibrinogen: 375 mg/dL (ref 210–475)

## 2023-10-09 MED ORDER — SODIUM CHLORIDE 0.9% IV SOLUTION: Freq: Once | INTRAVENOUS | Status: AC

## 2023-10-09 NOTE — TOC Progression Note (Signed)
 Transition of Care [facility]) - Progression Note    Patient Details  Name: Lisa Wilkerson MRN: [id number] Date of Birth: 1945/08/25  Transition of Care [facility]) CM/SW Contact  [place], [place] Phone Number: 10/09/2023, 10:30 AM  Clinical Narrative:     CSW met with patient. Family was at bedside. CSW advised family that [facility] offered a bed. Family declined offer. Family advised that they prefer [facility], [facility]. CSW advised that [facility] will be added to the list. Family agreed.   Patient has Medicare and [employer].   Expected Discharge Plan: Skilled Nursing Facility Barriers to Discharge: Continued Medical Work up  Expected Discharge Plan and Services     Post Acute Care Choice: Skilled Nursing Facility Living arrangements for the past 2 months: Single Family Home                                       Social Determinants of Health (SDOH) Interventions SDOH Screenings   Food Insecurity: No Food Insecurity (10/07/2023)  Housing: Low Risk  (10/07/2023)  Transportation Needs: No Transportation Needs (10/07/2023)  Utilities: Not At Risk (10/07/2023)  Depression (PHQ2-9): Low Risk  (01/29/2019)  Financial Resource Strain: Low Risk  (05/14/2023)   Received from [facility]  Physical Activity: Inactive (02/06/2018)  Social Connections: Unknown (10/07/2023)  Stress: No Stress Concern Present (02/06/2018)  Tobacco Use: High Risk (10/07/2023)    Readmission Risk Interventions     No data to display

## 2023-10-09 NOTE — Plan of Care (Signed)

## 2023-10-09 NOTE — Progress Notes (Signed)
 Progress Note   Patient: Lisa Wilkerson FMW:[id number] DOB: [birth year] DOA: 10/07/2023     2 DOS: the patient was seen and examined on 10/09/2023     Brief hospital course: Lisa Wilkerson is a 78 y.o. 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  She hit her head but did not lose consciousness.  Patient was taken to the OR for left hip fracture repair by orthopedics on 10/07/2023.  Postoperatively patient was noted to have hypotension with systolic of 60s to 80s not responsive to IV fluid and therefore placed on vasopressor support.  Repeat H&H showed hemoglobin of 5.8.  Patient is currently off of vasopressor support.  The rest of management as outlined below     Assessment and Plan:     Acute blood loss anemia Thigh hematoma Status post 3 units of packed RBC transfusion Continue blood transfusion Monitor CBC closely Plan of care discussed with intensivist  * Closed comminuted intertrochanteric fracture of proximal end of left femur, initial encounter (HCC) Continue pain control PT OT on board Status post fracture repair by orthopedic surgery     Hemorrhagic shock-resolved Required vasopressor support Have now been weaned off vasopressor Continue to monitor blood pressure closely   Leukocytosis Likely secondary to acute fracture Chest x-ray clear We will get urinalysis, blood culture   Acute renal failure superimposed on stage 3b chronic kidney disease (HCC) Creatinine 1.91 up from baseline of 1.34 in February 2025 NS bolus ordered Avoid nephrotoxins Continue to monitor renal function   COPD, moderate (HCC) Not acutely exacerbated Albuterol  as  needed Patient on the last few days of a 21-day course of tapering Dexamethasone  for recent bronchitic flare, prescribed by her pulmonologist-will discontinue in the setting of acute fracture and upcoming surgery   Tobacco use disorder Patient declines nicotine patch   Underweight (BMI < 18.5) Dietary consult   Stage 3b chronic kidney disease (HCC) Last creatinine 1.34 in February 2025 Awaiting updated labs   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  Started on XRT on 09/30/2023 No acute disease suspected   CAD S/P percutaneous coronary angioplasty No complaints of chest pain and EKG is nonacute Continue atorvastatin  Will hold aspirin  for surgery this a.m.   History of TIA (transient ischemic attack) Continue statin.   Holding antiplatelets given acute bleeding   Atherosclerosis of native arteries of extremity with intermittent claudication (HCC) AAA Carotid artery stenosis s/p endarterectomy Followed by vascular surgery Holding antiplatelet Continue statin   Essential hypertension, benign Avoiding antihypertensives at this time   DVT prophylaxis: SCD   Consults: Dr Earnestine Blanch   Advance Care Planning:   Code Status: Prior    Family Communication: none   Disposition Plan: Back to previous home environment   Subjective:  Patient seen and examined at bedside this morning in the presence of the family Patient blood pressure improved We will transfer her from the unit to the floor today   Physical Exam:   General: Elderly female laying in bed in no acute distress HENT:     Head: Normocephalic.  Cardiovascular:     Rate and Rhythm: Normal rate and regular rhythm.  Heart sounds: Normal heart sounds.  Pulmonary:     Effort: Pulmonary effort is normal.     Breath sounds: Normal breath sounds.  Abdominal:     Palpations: Abdomen is soft.     Tenderness: There is no abdominal tenderness.  Neurological:     General: No focal deficit  present.     Mental Status: Mental status is at baseline.   Data Reviewed: X-ray of the hip showing fracture of the left hip   Vitals:   10/09/23 0900 10/09/23 1000 10/09/23 1100 10/09/23 1200  BP: 135/71 115/63 122/66 120/68  Pulse: 69 71 77 78  Resp: 16 15    Temp:      TempSrc:      SpO2: 93% 95% 96% 94%  Weight:      Height:         Author: Drue ONEIDA Potter, MD 10/09/2023 3:18 PM  For on call review [email or web address].

## 2023-10-09 NOTE — Progress Notes (Addendum)
 Physical Therapy Treatment Patient Details Name: Lisa Wilkerson MRN: [id number] DOB: December 17, 1945 Today's Date: 10/09/2023   History of Present Illness Pt is a 78 year old female mitted with acute Left Intertrochanteric Hip Fracture post fall at home with acute blood loss anemia due to left intramuscular hematoma from the fracture site.  Status post Intramedullary nailing of Left Femur.  In PACU, course complicated by Hemorrhagic shock.    PMH significant for LUL Lung Adenocarcinoma (started on XRT 09/30/23), COPD, tobacco abuse, HTN, PAD, AAA, Left ICA stenosis s/p endarterectomy    PT Comments  Pt was long sitting in bed upon arrival. She is A and O and agreeable to session. Does not endorse pain at rest however endorsed 7/10 pain with movements/wt bearing. Pt is extremely motivated and eager for OOB activity. BP at rest 118/75. Upon sitting up EOB 94/64(75). 2 minutes in siting 95/53(67). No symptoms throughout session of dizziness or lightheadedness. Pt has poor pleth reading for O2 saturation. RN is aware.  Pt did stand EOB several times prior to taking step along EOB then to recliner. Vcs for lateral wt shift to allow opposite LE advancement. Overall pt tolerated session well. She was sitting in recliner at conclusion of session with RN present and call bell in reach. Acute PT will continue to follow and progress per current POC. DC recs remain appropriate to maximize her independence and safety with all ADLs.    If plan is discharge home, recommend the following: A little help with walking and/or transfers;A little help with bathing/dressing/bathroom;Assistance with cooking/housework;Direct supervision/assist for medications management;Direct supervision/assist for financial management;Assist for transportation;Help with stairs or ramp for entrance     Equipment Recommendations  Other (comment) (Defer to next level of care)       Precautions / Restrictions Precautions Precautions: Fall Recall  of Precautions/Restrictions: Intact Restrictions Weight Bearing Restrictions Per Provider Order: No LLE Weight Bearing Per Provider Order: Weight bearing as tolerated     Mobility  Bed Mobility Overal bed mobility: Needs Assistance Bed Mobility: Supine to Sit  Supine to sit: Min assist, HOB elevated, Used rails General bed mobility comments: Increased time + vcs for how to progress LLE to EOB.    Transfers Overall transfer level: Needs assistance Equipment used: Rolling walker (2 wheels) Transfers: Sit to/from Stand Sit to Stand: Min assist, From elevated surface  General transfer comment: min assist to stand 3 x EOB to RW. Pt was able to take a few antalgic steps form EOB to recliner    Ambulation/Gait Ambulation/Gait assistance: Min assist Gait Distance (Feet): 3 Feet Assistive device: Rolling walker (2 wheels) Gait Pattern/deviations: Step-to pattern, Antalgic Gait velocity: decreased  General Gait Details: pt was able to take a few steps form EOB to recliner. vcs fo improved wt shift to allow opposite LE advancement. Pt does not endorse any symptoms of dizziness throughout session.    Balance Overall balance assessment: Needs assistance Sitting-balance support: Feet supported Sitting balance-Leahy Scale: Good     Standing balance support: Bilateral upper extremity supported, During functional activity, Reliant on assistive device for balance Standing balance-Leahy Scale: Fair       Hotel manager: No apparent difficulties  Cognition Arousal: Alert Behavior During Therapy: WFL for tasks assessed/performed   PT - Cognitive impairments: No apparent impairments    PT - Cognition Comments: Pt is A and O x 4. pleasnat and cooprative. Following commands: Intact      Cueing Cueing Techniques: Verbal cues  General Comments General comments (skin integrity, edema, etc.): reviewed importance of ther ex even when in bed/in recliner. Pt  stated understanding. She will continue to benefit form skille dpT at DC to amximiz e independnece while assist pt to PLOF      Pertinent Vitals/Pain Pain Assessment Pain Assessment: 0-10 Pain Score: 7      PT Goals (current goals can now be found in the care plan section) Acute Rehab PT Goals Patient Stated Goal: To return to PLOF Progress towards PT goals: Progressing toward goals    Frequency    7X/week           Co-evaluation     PT goals addressed during session: Mobility/safety with mobility;Balance;Proper use of DME;Strengthening/ROM        AM-PAC PT 6 Clicks Mobility   Outcome Measure  Help needed turning from your back to your side while in a flat bed without using bedrails?: A Little Help needed moving from lying on your back to sitting on the side of a flat bed without using bedrails?: A Lot Help needed moving to and from a bed to a chair (including a wheelchair)?: A Lot Help needed standing up from a chair using your arms (e.g., wheelchair or bedside chair)?: A Lot Help needed to walk in hospital room?: A Little Help needed climbing 3-5 steps with a railing? : A Lot 6 Click Score: 14    End of Session Equipment Utilized During Treatment: Oxygen Activity Tolerance: Patient tolerated treatment well;Patient limited by fatigue Patient left: in chair;with call bell/phone within reach;with nursing/sitter in room Nurse Communication: Mobility status PT Visit Diagnosis: Muscle weakness (generalized) (M62.81);Difficulty in walking, not elsewhere classified (R26.2);Other abnormalities of gait and mobility (R26.89);History of falling (Z91.81);Pain Pain - Right/Left: Left Pain - part of body: Hip     Time: 8474-8452 PT Time Calculation (min) (ACUTE ONLY): 22 min  Charges:    $Therapeutic Activity: 8-22 mins PT General Charges $$ ACUTE PT VISIT: 1 Visit                    Rankin Essex PTA 10/09/23, 4:58 PM

## 2023-10-09 NOTE — Progress Notes (Incomplete)
 Subjective: 2 Days Post-Op Procedure(s) (LRB): FIXATION, FRACTURE, INTERTROCHANTERIC, WITH INTRAMEDULLARY ROD (Left) complicated by hemorrhagic shock.  Currently in the ICU. Patient reports pain as mild.   Patient is well, hemorrhagic shock.  Currently on pressors as needed, 3 units of pRBC ordered post op. PT and care management to assist with discharge planning. Negative for chest pain and shortness of breath Fever: no Gastrointestinal:Negative for nausea and vomiting Reports she is passing some gas this AM.  Objective: Vital signs in last 24 hours: Temp:  [97 F (36.1 C)-98 F (36.7 C)] 97.1 F (36.2 C) (07/05 0400) Pulse Rate:  [69-84] 73 (07/05 0700) Resp:  [10-32] 15 (07/05 0700) BP: (48-144)/(38-96) 126/73 (07/05 0700) SpO2:  [78 %-100 %] 95 % (07/05 0700)  Intake/Output from previous day:  Intake/Output Summary (Last 24 hours) at 10/09/2023 0836 Last data filed at 10/09/2023 0600 Gross per 24 hour  Intake 1806.05 ml  Output 225 ml  Net 1581.05 ml    Intake/Output this shift: No intake/output data recorded.  Labs: Recent Labs    10/07/23 1510 10/07/23 2202 10/08/23 0616 10/08/23 1712 10/09/23 0508  HGB 5.8* 9.6*  9.6* 8.4* 7.4* 7.5*   Recent Labs    10/08/23 1712 10/09/23 0508  WBC 26.5* 27.1*  RBC 2.35* 2.42*  HCT 22.1* 22.5*  PLT 92* 78*   Recent Labs    10/08/23 1322 10/09/23 0508  NA 137 135  K 5.1 5.4*  CL 103 102  CO2 23 23  BUN 47* 51*  CREATININE 2.65* 2.46*  GLUCOSE 82 92  CALCIUM  7.8* 7.6*   Recent Labs    10/08/23 1712 10/09/23 0508  INR 1.2 1.2     EXAM General - Patient is Alert, Appropriate, and Oriented Extremity - Honeycomb dressings intact to the left leg. Mild bloody drainage noted, no active bleeding or oozing from the sites. Thigh is swollen but compressible.  No rigidity noted. Intact to light touch to the left leg. Able to dorsiflex and plantarflex foot without pain. Negative homans. Pulses intact to  bilateral lower extremities.  Past Medical History:  Diagnosis Date   AAA (abdominal aortic aneurysm) without rupture (HCC)    a.) not candidate for endovascular repair per cardiology/vascular   Actinic keratosis 10/29/2014   L hand thenar - biopsy proven   Actinic keratosis 08/17/2017   L forearm - biopsy proven   Anterior communicating artery aneurysm (LEFT)    Aortic atherosclerosis (HCC)    Arthritis    Basal cell carcinoma of skin    Carotid stenosis    a.) s/p LEFT CEA 11/28/2015   CKD (chronic kidney disease), stage III (HCC)    COPD (chronic obstructive pulmonary disease) (HCC)    Coronary artery disease    DDD (degenerative disc disease), lumbosacral    Full dentures    History of 2019 novel coronavirus disease (COVID-19) 05/31/2023   History of bilateral cataract extraction    Hyperlipidemia    Hypertension    Left upper lobe pulmonary nodule    Long-term use of aspirin  therapy    NSTEMI (non-ST elevated myocardial infarction) (HCC) 07/2009   Peripheral vascular disease (HCC)    Psoriasis    Sleep apnea    a.) does not require nocturnal PAP therapy s/p UPPP   Squamous cell carcinoma of skin 07/13/2017   L hand dorsum SCCIS   Squamous cell carcinoma of skin 11/23/2017   L mid forearm    Squamous cell carcinoma of skin 01/07/2022   Right Shoulder -  Posterior, EDC   Squamous cell carcinoma of skin 09/07/2023   R hand dorsum, EDC   Squamous cell skin cancer 06/18/2014   L lat lower leg below the knee   TIA (transient ischemic attack) 2014   Vertigo    Warthin's tumor (LEFT)     Assessment/Plan: 2 Days Post-Op Procedure(s) (LRB): FIXATION, FRACTURE, INTERTROCHANTERIC, WITH INTRAMEDULLARY ROD (Left) Principal Problem:   Closed comminuted intertrochanteric fracture of proximal end of left femur, initial encounter (HCC) Active Problems:   Carotid stenosis   Essential hypertension, benign   AAA (abdominal aortic aneurysm) without rupture (HCC)    Atherosclerosis of native arteries of extremity with intermittent claudication (HCC)   COPD, moderate (HCC)   History of TIA (transient ischemic attack)   CAD S/P percutaneous coronary angioplasty   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   Weight as of this encounter: 43.1 kg.  Labs and vitals reviewed. Hg 8.4 this AM, BP 114/94. Currently in ICU, pressors prn.   3 units of pRBC ordered for patient. Continue to monitor BP and hemoglobin.   Work on a BM. Up with therapy when able, most likely will need SNF at d/c. Resume home dose of Plavix  when stable.  DVT Prophylaxis - Aspirin  and Plavix  Weight-Bearing as tolerated to left leg  J. Gustavo Level, PA-C [facility] 10/09/2023, 8:36 AM

## 2023-10-09 NOTE — Progress Notes (Signed)
 Patient report given to 1A RN with no questions or conerns. Informed her that blood was started and voiced no questions or concerns. Patient remains alert and oriented with no distress noted. Call son [name] and informed of transfer and he verbalized understanding. Tolerated oral medication well with no distress noted. Asked Erminio Cone, NP about foley removal and she stated to keep overnight due to blood administration. Patient transferred with tablet, charger and Phone. Will transfer care with move to new room.

## 2023-10-09 NOTE — Significant Event (Addendum)
       CROSS COVER NOTE  NAME: SUNDAE MANERS MRN: [id number] DOB : 1946-02-15 ATTENDING PHYSICIAN: Dorinda Drue DASEN, MD    Date of Service   10/09/2023   HPI/Events of Note   Repeat HGB now 6.8 down from 7.5  Hospital course per attending note 78 yo  female with medical history significant for Recently diagnosed left upper lobe adenocarcinoma of the lung started on XRT on 09/30/2023, as well as history of COPD, PAD/AAA/left ICA stenosis, HTN, CAD, TIA, tobacco use, being admitted with left hip fracture sustained from an accidental fall when she tripped on her way back to bed after a trip to the bathroom during the night.  She hit her head but did not lose consciousness.  Patient was taken to the OR for left hip fracture repair by orthopedics on 10/07/2023.  Postoperatively patient was noted to have hypotension with systolic of 60s to 80s not responsive to IV fluid and therefore placed on vasopressor support.   Received 3 units PRBC for symtomatic hemorrhagic anemia (thigh hematoma from Left femur frx s/p repair) Repeat H&H showed hemoglobin of 5.8.  Patient is currently off of vasopressor support.    Interventions   Assessment/Plan:    10/09/2023    8:00 PM 10/09/2023    3:43 PM 10/09/2023    3:00 PM  Vitals with BMI  Systolic 118 143 875  Diastolic 55 73 76  Pulse  100 77   Patient denies any new sxs or pain. Agrees with addition unit of PRBC. Transfuse additional unit PRBC        Dashiel Bergquist L Jozeph Persing NP [facility] Cross Cover 7pm-7am - check amion for availability Pager [phone]

## 2023-10-10 DIAGNOSIS — S72142A Displaced intertrochanteric fracture of left femur, initial encounter for closed fracture: Secondary | ICD-10-CM | POA: Diagnosis not present

## 2023-10-10 LAB — PREPARE FRESH FROZEN PLASMA: Unit division: 0

## 2023-10-10 LAB — RENAL FUNCTION PANEL
Albumin: 2 g/dL — ABNORMAL LOW (ref 3.5–5.0)
Anion gap: 9 (ref 5–15)
BUN: 50 mg/dL — ABNORMAL HIGH (ref 8–23)
CO2: 21 mmol/L — ABNORMAL LOW (ref 22–32)
Calcium: 7.3 mg/dL — ABNORMAL LOW (ref 8.9–10.3)
Chloride: 102 mmol/L (ref 98–111)
Creatinine, Ser: 2.42 mg/dL — ABNORMAL HIGH (ref 0.44–1.00)
GFR, Estimated: 20 mL/min — ABNORMAL LOW (ref >60)
Glucose, Bld: 91 mg/dL (ref 70–99)
Phosphorus: 5.2 mg/dL — ABNORMAL HIGH (ref 2.5–4.6)
Potassium: 4.4 mmol/L (ref 3.5–5.1)
Sodium: 132 mmol/L — ABNORMAL LOW (ref 135–145)

## 2023-10-10 LAB — CBC
HCT: 22.4 % — ABNORMAL LOW (ref 36.0–46.0)
HCT: 24.8 % — ABNORMAL LOW (ref 36.0–46.0)
Hemoglobin: 7.6 g/dL — ABNORMAL LOW (ref 12.0–15.0)
Hemoglobin: 8.2 g/dL — ABNORMAL LOW (ref 12.0–15.0)
MCH: 31.7 pg (ref 26.0–34.0)
MCH: 31.3 pg (ref 26.0–34.0)
MCHC: 33.9 g/dL (ref 30.0–36.0)
MCHC: 33.1 g/dL (ref 30.0–36.0)
MCV: 93.3 fL (ref 80.0–100.0)
MCV: 94.7 fL (ref 80.0–100.0)
Platelets: 65 K/uL — ABNORMAL LOW (ref 150–400)
Platelets: 81 K/uL — ABNORMAL LOW (ref 150–400)
RBC: 2.4 MIL/uL — ABNORMAL LOW (ref 3.87–5.11)
RBC: 2.62 MIL/uL — ABNORMAL LOW (ref 3.87–5.11)
RDW: 15.1 % (ref 11.5–15.5)
RDW: 15.7 % — ABNORMAL HIGH (ref 11.5–15.5)
WBC: 20.2 K/uL — ABNORMAL HIGH (ref 4.0–10.5)
WBC: 19.1 K/uL — ABNORMAL HIGH (ref 4.0–10.5)
nRBC: 0 % (ref 0.0–0.2)
nRBC: 0.2 % (ref 0.0–0.2)

## 2023-10-10 LAB — BPAM PLATELET PHERESIS
Blood Product Expiration Date: 202507062359
ISSUE DATE / TIME: 202507032330
Unit Type and Rh: 5100

## 2023-10-10 LAB — PREPARE PLATELET PHERESIS: Unit division: 0

## 2023-10-10 LAB — BPAM FFP
Blood Product Expiration Date: 202507052359
Blood Product Expiration Date: 202507052359
ISSUE DATE / TIME: 202507032049
Unit Type and Rh: 6200
Unit Type and Rh: 6200

## 2023-10-10 MED ORDER — SODIUM CHLORIDE 0.9 % IV SOLN
1.0000 g | INTRAVENOUS | Status: AC
  Administered 2023-10-10 – 2023-10-14 (×5): 1 g via INTRAVENOUS
  Filled 2023-10-10 (×5): qty 10

## 2023-10-10 NOTE — Progress Notes (Signed)
 Patient transferred wit 134 with dentures in her mouth and blood still infusing

## 2023-10-10 NOTE — Progress Notes (Signed)
 Progress Note   Patient: Lisa Wilkerson DOBRATZ FMW:[id number] DOB: Dec 14, 1945 DOA: 10/07/2023     3 DOS: the patient was seen and examined on 10/10/2023       Brief hospital course: SHAREENA NUSZ is a 78 y.o. 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  She hit her head but did not lose consciousness.  Patient was taken to the OR for left hip fracture repair by orthopedics on 10/07/2023.  Postoperatively patient was noted to have hypotension with systolic of 60s to 80s not responsive to IV fluid and therefore placed on vasopressor support.  Repeat H&H showed hemoglobin of 5.8.  Patient is currently off of vasopressor support.  The rest of management as outlined below     Assessment and Plan:     Acute blood loss anemia Thigh hematoma Status post 3 units of packed RBC transfusion Continue blood transfusion Monitor CBC closely Plan of care discussed with intensivist  * Closed comminuted intertrochanteric fracture of proximal end of left femur, initial encounter (HCC) Continue pain control Continue Status post fracture repair by orthopedic surgery     Hemorrhagic shock-resolved Required vasopressor support Have now been weaned off vasopressor Continue to monitor blood pressure closely   Leukocytosis in the setting of UTI Likely secondary to acute fracture Chest x-ray clear Continue antibiotics   Acute renal failure superimposed on stage 3b chronic kidney disease (HCC) Creatinine 1.91 up from baseline of 1.34 in February 2025 NS bolus ordered Avoid nephrotoxins Continue to monitor renal function   COPD, moderate (HCC) Not acutely exacerbated Albuterol  as  needed Patient on the last few days of a 21-day course of tapering Dexamethasone  for recent bronchitic flare, prescribed by her pulmonologist-will discontinue in the setting of acute fracture and upcoming surgery   Tobacco use disorder Patient declines nicotine patch   Underweight (BMI < 18.5) Dietary consult   Stage 3b chronic kidney disease (HCC) Last creatinine 1.34 in February 2025 Awaiting updated labs   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  Started on XRT on 09/30/2023 No acute disease suspected   CAD S/P percutaneous coronary angioplasty No complaints of chest pain and EKG is nonacute Continue atorvastatin  Will hold aspirin  for surgery this a.m.   History of TIA (transient ischemic attack) Continue statin.   Holding antiplatelets given acute bleeding   Atherosclerosis of native arteries of extremity with intermittent claudication (HCC) AAA Carotid artery stenosis s/p endarterectomy Followed by vascular surgery Holding antiplatelet Continue statin   Essential hypertension, benign Avoiding antihypertensives at this time   DVT prophylaxis: SCD   Consults: Dr Earnestine Blanch   Advance Care Planning:   Code Status: Prior    Family Communication: none   Disposition Plan: Back to previous home environment   Subjective:  Patient continues to work with PT OT Initiated on antibiotics for UTI Denies nausea vomiting abdominal pain or chest pain   Physical Exam:   General: Elderly female laying in bed in no acute distress HENT:     Head: Normocephalic.  Cardiovascular:     Rate and Rhythm: Normal rate and regular rhythm.     Heart sounds: Normal heart sounds.  Pulmonary:  Effort: Pulmonary effort is normal.     Breath sounds: Normal breath sounds.  Abdominal:     Palpations: Abdomen is soft.     Tenderness: There is no abdominal tenderness.  Neurological:     General: No focal deficit present.     Mental Status: Mental status  is at baseline.   Data Reviewed:      Latest Ref Rng & Units 10/10/2023    4:34 AM 10/09/2023    4:55 PM 10/09/2023    5:08 AM  CBC  WBC 4.0 - 10.5 K/uL 20.2  25.8  27.1   Hemoglobin 12.0 - 15.0 g/dL 7.6  6.8  7.5   Hematocrit 36.0 - 46.0 % 22.4  20.6  22.5   Platelets 150 - 400 K/uL 65  84  78        Latest Ref Rng & Units 10/10/2023    4:34 AM 10/09/2023    5:08 AM 10/08/2023    1:22 PM  BMP  Glucose 70 - 99 mg/dL 91  92  82   BUN 8 - 23 mg/dL 50  51  47   Creatinine 0.44 - 1.00 mg/dL 7.57  7.53  7.34   Sodium 135 - 145 mmol/L 132  135  137   Potassium 3.5 - 5.1 mmol/L 4.4  5.4  5.1   Chloride 98 - 111 mmol/L 102  102  103   CO2 22 - 32 mmol/L 21  23  23    Calcium  8.9 - 10.3 mg/dL 7.3  7.6  7.8     Vitals:   10/10/23 0150 10/10/23 0405 10/10/23 0743 10/10/23 1534  BP: (!) 167/67 (!) 167/81 (!) 142/77 (!) 152/61  Pulse: 73 74 69 70  Resp: 18 16 16 16   Temp: 98.3 F (36.8 C) 98.7 F (37.1 C) 98.2 F (36.8 C) 97.7 F (36.5 C)  TempSrc: Axillary     SpO2: 96% 99% 99% 100%  Weight:      Height:         Author: Drue ONEIDA Potter, MD 10/10/2023 4:23 PM  For on call review [email or web address].

## 2023-10-10 NOTE — Progress Notes (Signed)
 Physical Therapy Treatment Patient Details Name: Lisa Wilkerson MRN: [id number] DOB: 07-12-1945 Today's Date: 10/10/2023   History of Present Illness Pt is a 78 year old female mitted with acute Left Intertrochanteric Hip Fracture post fall at home with acute blood loss anemia due to left intramuscular hematoma from the fracture site.  Status post Intramedullary nailing of Left Femur.  In PACU, course complicated by Hemorrhagic shock.    PMH significant for LUL Lung Adenocarcinoma (started on XRT 09/30/23), COPD, tobacco abuse, HTN, PAD, AAA, Left ICA stenosis s/p endarterectomy    PT Comments  Pt in bed, ready to get up.  She transitions to sitting with bed features and min a x 1.  Steady in sitting with no dizziness noted.  Stands to RW with min a x 1 and cues for hand placements.  Time spent in standing for pre-gait posture and AROM LLE.  After seated rest, she stands again and is able to transfer to recliner at bedside with slow shuffling gait pattern but overall does well.  Again static stands before sitting in recliner.  Seated AROM LLE that feels good  remained up in chair with needs met.   If plan is discharge home, recommend the following: A little help with walking and/or transfers;A little help with bathing/dressing/bathroom;Assistance with cooking/housework;Direct supervision/assist for medications management;Direct supervision/assist for financial management;Assist for transportation;Help with stairs or ramp for entrance   Can travel by private vehicle        Equipment Recommendations       Recommendations for Other Services       Precautions / Restrictions Precautions Precautions: Fall Recall of Precautions/Restrictions: Intact Restrictions Weight Bearing Restrictions Per Provider Order: No LLE Weight Bearing Per Provider Order: Weight bearing as tolerated     Mobility  Bed Mobility Overal bed mobility: Needs Assistance Bed Mobility: Supine to Sit     Supine to sit:  Min assist, HOB elevated, Used rails       Patient Response: Cooperative  Transfers Overall transfer level: Needs assistance Equipment used: Rolling walker (2 wheels) Transfers: Sit to/from Stand Sit to Stand: Min assist                Ambulation/Gait Ambulation/Gait assistance: Min Chemical engineer (Feet): 3 Feet Assistive device: Rolling walker (2 wheels) Gait Pattern/deviations: Step-to pattern, Antalgic Gait velocity: decreased     General Gait Details: limited to transfers.  time spent in standing for ex and static stand.   Stairs             Wheelchair Mobility     Tilt Bed Tilt Bed Patient Response: Cooperative  Modified Rankin (Stroke Patients Only)       Balance Overall balance assessment: Needs assistance Sitting-balance support: Feet supported Sitting balance-Leahy Scale: Good     Standing balance support: Bilateral upper extremity supported, During functional activity, Reliant on assistive device for balance Standing balance-Leahy Scale: Fair                              Hotel manager: No apparent difficulties  Cognition Arousal: Alert Behavior During Therapy: WFL for tasks assessed/performed   PT - Cognitive impairments: No apparent impairments                         Following commands: Intact      Cueing Cueing Techniques: Verbal cues  Exercises Other Exercises Other Exercises: seated AROM,  standing AROM LLE with walker support.  general static standing pre-gait    General Comments        Pertinent Vitals/Pain Pain Assessment Pain Assessment: No/denies pain (denies at rest.  generally comfortable with mobility)    Home Living                          Prior Function            PT Goals (current goals can now be found in the care plan section) Progress towards PT goals: Progressing toward goals    Frequency    7X/week      PT Plan       Co-evaluation              AM-PAC PT 6 Clicks Mobility   Outcome Measure  Help needed turning from your back to your side while in a flat bed without using bedrails?: A Little Help needed moving from lying on your back to sitting on the side of a flat bed without using bedrails?: A Lot Help needed moving to and from a bed to a chair (including a wheelchair)?: A Little Help needed standing up from a chair using your arms (e.g., wheelchair or bedside chair)?: A Little Help needed to walk in hospital room?: A Little Help needed climbing 3-5 steps with a railing? : A Lot 6 Click Score: 16    End of Session Equipment Utilized During Treatment: Gait belt;Oxygen Activity Tolerance: Patient tolerated treatment well;Patient limited by fatigue Patient left: in chair;with call bell/phone within reach;with family/visitor present Nurse Communication: Mobility status PT Visit Diagnosis: Muscle weakness (generalized) (M62.81);Difficulty in walking, not elsewhere classified (R26.2);Other abnormalities of gait and mobility (R26.89);History of falling (Z91.81);Pain Pain - Right/Left: Left Pain - part of body: Hip     Time: 1001-1029 PT Time Calculation (min) (ACUTE ONLY): 28 min  Charges:    $Therapeutic Exercise: 8-22 mins $Therapeutic Activity: 8-22 mins PT General Charges $$ ACUTE PT VISIT: 1 Visit                    Lauraine Gills, PTA 10/10/23, 11:10 AM

## 2023-10-10 NOTE — Progress Notes (Signed)
 Subjective: 3 Days Post-Op Procedure(s) (LRB): FIXATION, FRACTURE, INTERTROCHANTERIC, WITH INTRAMEDULLARY ROD (Left) complicated by hemorrhagic shock.  Has been transferred from the ICU to the floor, no longer needing pressors. Patient reports pain as mild.   Patient is well, hemorrhagic shock.  S/p 4 units of pRBC. Patient will likely need SNF at discharge. Negative for chest pain and shortness of breath Fever: no Gastrointestinal:Negative for nausea and vomiting Reports she is passing some gas this AM. Has not had a BM yet.  Objective: Vital signs in last 24 hours: Temp:  [97.4 F (36.3 C)-98.7 F (37.1 C)] 98.2 F (36.8 C) (07/06 0743) Pulse Rate:  [69-100] 69 (07/06 0743) Resp:  [14-25] 16 (07/06 0743) BP: (118-167)/(55-97) 142/77 (07/06 0743) SpO2:  [91 %-99 %] 99 % (07/06 0743)  Intake/Output from previous day:  Intake/Output Summary (Last 24 hours) at 10/10/2023 1206 Last data filed at 10/10/2023 0145 Gross per 24 hour  Intake 1265.06 ml  Output 380 ml  Net 885.06 ml    Intake/Output this shift: No intake/output data recorded.  Labs: Recent Labs    10/08/23 0616 10/08/23 1712 10/09/23 0508 10/09/23 1655 10/10/23 0434  HGB 8.4* 7.4* 7.5* 6.8* 7.6*   Recent Labs    10/09/23 1655 10/10/23 0434  WBC 25.8* 20.2*  RBC 2.20* 2.40*  HCT 20.6* 22.4*  PLT 84* 65*   Recent Labs    10/09/23 0508 10/10/23 0434  NA 135 132*  K 5.4* 4.4  CL 102 102  CO2 23 21*  BUN 51* 50*  CREATININE 2.46* 2.42*  GLUCOSE 92 91  CALCIUM  7.6* 7.3*   Recent Labs    10/09/23 0508 10/09/23 1655  INR 1.2 1.1     EXAM General - Patient is Alert, Appropriate, and Oriented Extremity - Honeycomb dressings intact to the left leg. Mild bloody drainage noted, no active bleeding or oozing from the sites. Thigh is swollen but compressible.  No rigidity noted.  Swelling has improved. Honeycomb dressings changed today. Intact to light touch to the left leg. Able to dorsiflex  and plantarflex foot without pain. Negative homans. Pulses intact to bilateral lower extremities.  Past Medical History:  Diagnosis Date   AAA (abdominal aortic aneurysm) without rupture (HCC)    a.) not candidate for endovascular repair per cardiology/vascular   Actinic keratosis 10/29/2014   L hand thenar - biopsy proven   Actinic keratosis 08/17/2017   L forearm - biopsy proven   Anterior communicating artery aneurysm (LEFT)    Aortic atherosclerosis (HCC)    Arthritis    Basal cell carcinoma of skin    Carotid stenosis    a.) s/p LEFT CEA 11/28/2015   CKD (chronic kidney disease), stage III (HCC)    COPD (chronic obstructive pulmonary disease) (HCC)    Coronary artery disease    DDD (degenerative disc disease), lumbosacral    Full dentures    History of 2019 novel coronavirus disease (COVID-19) 05/31/2023   History of bilateral cataract extraction    Hyperlipidemia    Hypertension    Left upper lobe pulmonary nodule    Long-term use of aspirin  therapy    NSTEMI (non-ST elevated myocardial infarction) (HCC) 07/2009   Peripheral vascular disease (HCC)    Psoriasis    Sleep apnea    a.) does not require nocturnal PAP therapy s/p UPPP   Squamous cell carcinoma of skin 07/13/2017   L hand dorsum SCCIS   Squamous cell carcinoma of skin 11/23/2017   L mid forearm  Squamous cell carcinoma of skin 01/07/2022   Right Shoulder - Posterior, EDC   Squamous cell carcinoma of skin 09/07/2023   R hand dorsum, EDC   Squamous cell skin cancer 06/18/2014   L lat lower leg below the knee   TIA (transient ischemic attack) 2014   Vertigo    Warthin's tumor (LEFT)     Assessment/Plan: 3 Days Post-Op Procedure(s) (LRB): FIXATION, FRACTURE, INTERTROCHANTERIC, WITH INTRAMEDULLARY ROD (Left) Principal Problem:   Closed comminuted intertrochanteric fracture of proximal end of left femur, initial encounter (HCC) Active Problems:   Carotid stenosis   Essential hypertension, benign    AAA (abdominal aortic aneurysm) without rupture (HCC)   Atherosclerosis of native arteries of extremity with intermittent claudication (HCC)   COPD, moderate (HCC)   History of TIA (transient ischemic attack)   CAD S/P percutaneous coronary angioplasty   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   Weight as of this encounter: 43.1 kg.  Labs and vitals reviewed. Hg 7.6 this AM, BP 142/77 Transferred to the floor.   4 units of pRBC ordered for patient. Continue to monitor BP and hemoglobin.   Work on a BM. Up with therapy when able, most likely will need SNF at d/c. Resume home dose of Plavix  when stable.  DVT Prophylaxis - Aspirin  and Plavix  Weight-Bearing as tolerated to left leg  J. Gustavo Level, PA-C [facility] 10/10/2023, 12:06 PM

## 2023-10-10 NOTE — Plan of Care (Signed)
  Problem: Pain Managment: Goal: General experience of comfort will improve and/or be controlled Outcome: Progressing

## 2023-10-11 ENCOUNTER — Ambulatory Visit

## 2023-10-11 ENCOUNTER — Encounter: Payer: Self-pay | Admitting: Orthopedic Surgery

## 2023-10-11 DIAGNOSIS — S72142A Displaced intertrochanteric fracture of left femur, initial encounter for closed fracture: Secondary | ICD-10-CM | POA: Diagnosis not present

## 2023-10-11 LAB — CBC WITH DIFFERENTIAL/PLATELET
Abs Immature Granulocytes: 0.18 K/uL — ABNORMAL HIGH (ref 0.00–0.07)
Basophils Absolute: 0 K/uL (ref 0.0–0.1)
Basophils Relative: 0 %
Eosinophils Absolute: 0.1 K/uL (ref 0.0–0.5)
Eosinophils Relative: 0 %
HCT: 24.1 % — ABNORMAL LOW (ref 36.0–46.0)
Hemoglobin: 8.1 g/dL — ABNORMAL LOW (ref 12.0–15.0)
Immature Granulocytes: 1 %
Lymphocytes Relative: 8 %
Lymphs Abs: 1.3 K/uL (ref 0.7–4.0)
MCH: 31.3 pg (ref 26.0–34.0)
MCHC: 33.6 g/dL (ref 30.0–36.0)
MCV: 93.1 fL (ref 80.0–100.0)
Monocytes Absolute: 0.7 K/uL (ref 0.1–1.0)
Monocytes Relative: 4 %
Neutro Abs: 14.2 K/uL — ABNORMAL HIGH (ref 1.7–7.7)
Neutrophils Relative %: 87 %
Platelets: 86 K/uL — ABNORMAL LOW (ref 150–400)
RBC: 2.59 MIL/uL — ABNORMAL LOW (ref 3.87–5.11)
RDW: 15.7 % — ABNORMAL HIGH (ref 11.5–15.5)
WBC: 16.5 K/uL — ABNORMAL HIGH (ref 4.0–10.5)
nRBC: 0.2 % (ref 0.0–0.2)

## 2023-10-11 LAB — TYPE AND SCREEN
ABO/RH(D): A POS
Antibody Screen: NEGATIVE
Unit division: 0
Unit division: 0
Unit division: 0
Unit division: 0

## 2023-10-11 LAB — BPAM RBC
Blood Product Expiration Date: 202508022359
Blood Product Expiration Date: 202508032359
Blood Product Expiration Date: 202508032359
Blood Product Expiration Date: 202508042359
ISSUE DATE / TIME: 202507031539
ISSUE DATE / TIME: 202507031720
ISSUE DATE / TIME: 202507052321
Unit Type and Rh: 6200
Unit Type and Rh: 6200
Unit Type and Rh: 6200
Unit Type and Rh: 6200

## 2023-10-11 LAB — BASIC METABOLIC PANEL WITH GFR
Anion gap: 9 (ref 5–15)
BUN: 48 mg/dL — ABNORMAL HIGH (ref 8–23)
CO2: 22 mmol/L (ref 22–32)
Calcium: 7.8 mg/dL — ABNORMAL LOW (ref 8.9–10.3)
Chloride: 104 mmol/L (ref 98–111)
Creatinine, Ser: 2.48 mg/dL — ABNORMAL HIGH (ref 0.44–1.00)
GFR, Estimated: 19 mL/min — ABNORMAL LOW (ref >60)
Glucose, Bld: 90 mg/dL (ref 70–99)
Potassium: 4.1 mmol/L (ref 3.5–5.1)
Sodium: 135 mmol/L (ref 135–145)

## 2023-10-11 LAB — PREPARE RBC (CROSSMATCH)

## 2023-10-11 MED ORDER — AMLODIPINE BESYLATE 5 MG PO TABS
5.0000 mg | ORAL_TABLET | Freq: Every day | ORAL | Status: DC
  Administered 2023-10-11 – 2023-10-15 (×5): 5 mg via ORAL
  Filled 2023-10-11 (×5): qty 1

## 2023-10-11 MED ORDER — TRAMADOL HCL 50 MG PO TABS: 50.0000 mg | ORAL_TABLET | Freq: Four times a day (QID) | ORAL | 0 refills | Status: DC | PRN

## 2023-10-11 MED ORDER — OXYCODONE HCL 5 MG PO TABS: 2.5000 mg | ORAL_TABLET | ORAL | 0 refills | Status: DC | PRN

## 2023-10-11 NOTE — Progress Notes (Signed)
 Physical Therapy Treatment Patient Details Name: Lisa Wilkerson MRN: [id number] DOB: 05-08-1945 Today's Date: 10/11/2023   History of Present Illness Pt is a 78 year old female mitted with acute Left Intertrochanteric Hip Fracture post fall at home with acute blood loss anemia due to left intramuscular hematoma from the fracture site.  Status post Intramedullary nailing of Left Femur.  In PACU, course complicated by Hemorrhagic shock.    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  Stood with min a x 1 for pre-gait activities and LLE AROM.  She is able to take 1 good step today but the rest are shuffle steps to recliner.  After seated rest she stands again for several minutes but is unable to progress gait.  Overall remains motivated but does state she is fearful of falling and of pain.  Encouragement given.     If plan is discharge home, recommend the following: A little help with walking and/or transfers;A little help with bathing/dressing/bathroom;Assistance with cooking/housework;Direct supervision/assist for medications management;Direct supervision/assist for financial management;Assist for transportation;Help with stairs or ramp for entrance   Can travel by private vehicle        Equipment Recommendations       Recommendations for Other Services       Precautions / Restrictions Precautions Precautions: Fall Recall of Precautions/Restrictions: Intact Restrictions Weight Bearing Restrictions Per Provider Order: No LLE Weight Bearing Per Provider Order: Weight bearing as tolerated     Mobility  Bed Mobility Overal bed mobility: Needs Assistance Bed Mobility: Supine to Sit     Supine to sit: Min assist, HOB elevated, Used rails       Patient Response: Cooperative  Transfers Overall transfer level: Needs assistance Equipment used:  Rolling walker (2 wheels) Transfers: Sit to/from Stand Sit to Stand: Min assist                Ambulation/Gait Ambulation/Gait assistance: Editor, commissioning (Feet): 3 Feet Assistive device: Rolling walker (2 wheels) Gait Pattern/deviations: Step-to pattern, Antalgic Gait velocity: decreased     General Gait Details: one good step today but the rest mose shuffle/pivot steps   Stairs             Wheelchair Mobility     Tilt Bed Tilt Bed Patient Response: Cooperative  Modified Rankin (Stroke Patients Only)       Balance Overall balance assessment: Needs assistance Sitting-balance support: Feet supported Sitting balance-Leahy Scale: Good     Standing balance support: Bilateral upper extremity supported, During functional activity, Reliant on assistive device for balance Standing balance-Leahy Scale: Fair                              Hotel manager: No apparent difficulties  Cognition Arousal: Alert Behavior During Therapy: WFL for tasks assessed/performed   PT - Cognitive impairments: No apparent impairments                       PT - Cognition Comments: Pt is A and O x 4. pleasnat and cooprative. Following commands: Intact      Cueing Cueing Techniques: Verbal cues  Exercises Other Exercises Other Exercises: static standing and pre-gait activities    General Comments        Pertinent Vitals/Pain Pain  Assessment Pain Assessment: Faces Faces Pain Scale: Hurts little more Pain Descriptors / Indicators: Sore Pain Intervention(s): Limited activity within patient's tolerance, Monitored during session, Repositioned    Home Living                          Prior Function            PT Goals (current goals can now be found in the care plan section) Progress towards PT goals: Progressing toward goals    Frequency    7X/week      PT Plan      Co-evaluation               AM-PAC PT 6 Clicks Mobility   Outcome Measure  Help needed turning from your back to your side while in a flat bed without using bedrails?: A Little Help needed moving from lying on your back to sitting on the side of a flat bed without using bedrails?: A Little Help needed moving to and from a bed to a chair (including a wheelchair)?: A Little Help needed standing up from a chair using your arms (e.g., wheelchair or bedside chair)?: A Little Help needed to walk in hospital room?: A Lot Help needed climbing 3-5 steps with a railing? : Total 6 Click Score: 15    End of Session Equipment Utilized During Treatment: Gait belt;Oxygen Activity Tolerance: Patient tolerated treatment well;Patient limited by fatigue Patient left: in chair;with call bell/phone within reach;with family/visitor present Nurse Communication: Mobility status PT Visit Diagnosis: Muscle weakness (generalized) (M62.81);Difficulty in walking, not elsewhere classified (R26.2);Other abnormalities of gait and mobility (R26.89);History of falling (Z91.81);Pain Pain - Right/Left: Left Pain - part of body: Hip     Time: 8960-8895 PT Time Calculation (min) (ACUTE ONLY): 25 min  Charges:    $Therapeutic Exercise: 8-22 mins $Therapeutic Activity: 8-22 mins PT General Charges $$ ACUTE PT VISIT: 1 Visit                    Lauraine Gills, PTA 10/11/23, 12:34 PM

## 2023-10-11 NOTE — Progress Notes (Signed)
 Subjective: 4 Days Post-Op Procedure(s) (LRB): FIXATION, FRACTURE, INTERTROCHANTERIC, WITH INTRAMEDULLARY ROD (Left) complicated by hemorrhagic shock.  Has been transferred from the ICU to the floor, no longer needing pressors. Patient reports pain as mild.   Patient is well, hemorrhagic shock.  S/p 4 units of pRBC. Patient will likely need SNF at discharge. Negative for chest pain and shortness of breath Fever: no Gastrointestinal:Negative for nausea and vomiting Reports she is passing some gas this AM. Has not had a BM yet.  Objective: Vital signs in last 24 hours: Temp:  [97.7 F (36.5 C)-98.2 F (36.8 C)] 97.7 F (36.5 C) (07/06 1944) Pulse Rate:  [69-72] 72 (07/07 0315) Resp:  [16-18] 17 (07/07 0315) BP: (142-164)/(61-77) 164/73 (07/07 0315) SpO2:  [76 %-100 %] 98 % (07/07 0315)  Intake/Output from previous day:  Intake/Output Summary (Last 24 hours) at 10/11/2023 0655 Last data filed at 10/11/2023 0600 Gross per 24 hour  Intake 1140 ml  Output --  Net 1140 ml    Intake/Output this shift: Total I/O In: 100 [IV Piggyback:100] Out: -   Labs: Recent Labs    10/09/23 0508 10/09/23 1655 10/10/23 0434 10/10/23 1705 10/11/23 0316  HGB 7.5* 6.8* 7.6* 8.2* 8.1*   Recent Labs    10/10/23 1705 10/11/23 0316  WBC 19.1* 16.5*  RBC 2.62* 2.59*  HCT 24.8* 24.1*  PLT 81* 86*   Recent Labs    10/10/23 0434 10/11/23 0316  NA 132* 135  K 4.4 4.1  CL 102 104  CO2 21* 22  BUN 50* 48*  CREATININE 2.42* 2.48*  GLUCOSE 91 90  CALCIUM  7.3* 7.8*   Recent Labs    10/09/23 0508 10/09/23 1655  INR 1.2 1.1     EXAM General - Patient is Alert, Appropriate, and Oriented Extremity - Honeycomb dressings intact to the left leg. Mild serous drainage noted, on the honeycomb dressing. Thigh is swollen but compressible.  No rigidity noted.  Swelling has improved. Honeycomb dressings changed today. Intact to light touch to the left leg. Able to dorsiflex and plantarflex  foot without pain. Negative homans. Pulses intact to bilateral lower extremities.  Ambulated 3 feet with physical therapy.  Past Medical History:  Diagnosis Date   AAA (abdominal aortic aneurysm) without rupture (HCC)    a.) not candidate for endovascular repair per cardiology/vascular   Actinic keratosis 10/29/2014   L hand thenar - biopsy proven   Actinic keratosis 08/17/2017   L forearm - biopsy proven   Anterior communicating artery aneurysm (LEFT)    Aortic atherosclerosis (HCC)    Arthritis    Basal cell carcinoma of skin    Carotid stenosis    a.) s/p LEFT CEA 11/28/2015   CKD (chronic kidney disease), stage III (HCC)    COPD (chronic obstructive pulmonary disease) (HCC)    Coronary artery disease    DDD (degenerative disc disease), lumbosacral    Full dentures    History of 2019 novel coronavirus disease (COVID-19) 05/31/2023   History of bilateral cataract extraction    Hyperlipidemia    Hypertension    Left upper lobe pulmonary nodule    Long-term use of aspirin  therapy    NSTEMI (non-ST elevated myocardial infarction) (HCC) 07/2009   Peripheral vascular disease (HCC)    Psoriasis    Sleep apnea    a.) does not require nocturnal PAP therapy s/p UPPP   Squamous cell carcinoma of skin 07/13/2017   L hand dorsum SCCIS   Squamous cell carcinoma of skin  11/23/2017   L mid forearm    Squamous cell carcinoma of skin 01/07/2022   Right Shoulder - Posterior, EDC   Squamous cell carcinoma of skin 09/07/2023   R hand dorsum, EDC   Squamous cell skin cancer 06/18/2014   L lat lower leg below the knee   TIA (transient ischemic attack) 2014   Vertigo    Warthin's tumor (LEFT)     Assessment/Plan: 4 Days Post-Op Procedure(s) (LRB): FIXATION, FRACTURE, INTERTROCHANTERIC, WITH INTRAMEDULLARY ROD (Left) Principal Problem:   Closed comminuted intertrochanteric fracture of proximal end of left femur, initial encounter (HCC) Active Problems:   Carotid stenosis   Essential  hypertension, benign   AAA (abdominal aortic aneurysm) without rupture (HCC)   Atherosclerosis of native arteries of extremity with intermittent claudication (HCC)   COPD, moderate (HCC)   History of TIA (transient ischemic attack)   CAD S/P percutaneous coronary angioplasty   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   Weight as of this encounter: 43.1 kg.  Labs and vitals reviewed. Hg 8.1 this AM, BP stable 4 units of pRBC ordered for patient. Continue to monitor BP and hemoglobin.   Work on a BM. Up with therapy when able, most likely will need SNF at d/c. Resume home dose of Plavix  when stable.  Dressing changes needed.  May need ABD reinforced bandage.  DVT Prophylaxis - Aspirin  and Plavix  Weight-Bearing as tolerated to left leg  Krystal Doyne, PA-C [facility] 10/11/2023, 6:55 AM

## 2023-10-11 NOTE — Progress Notes (Signed)
 Progress Note   Patient: Lisa Wilkerson FMW:[id number] DOB: [birth year] DOA: 10/07/2023     4 DOS: the patient was seen and examined on 10/11/2023      Brief hospital course: Lisa Wilkerson is a 78 y.o. 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  She hit her head but did not lose consciousness.  Patient was taken to the OR for left hip fracture repair by orthopedics on 10/07/2023.  Postoperatively patient was noted to have hypotension with systolic of 60s to 80s not responsive to IV fluid and therefore placed on vasopressor support.  Repeat H&H showed hemoglobin of 5.8.  Patient is currently off of vasopressor support.  The rest of management as outlined below     Assessment and Plan:     Acute blood loss anemia Thigh hematoma Status post 4 units of packed RBC transfusion Continue blood transfusion Continue to monitor CBC closely Plan of care discussed with orthopedics  * Closed comminuted intertrochanteric fracture of proximal end of left femur, initial encounter (HCC) Continue pain control Continue PT OT Status post fracture repair by orthopedic surgery on 10/07/2023     Hemorrhagic shock-resolved Required vasopressor support Have now been weaned off vasopressor Continue to monitor blood pressure closely   Leukocytosis in the setting of UTI Chest x-ray clear Continue antibiotics   Acute renal failure superimposed on stage 3b chronic kidney disease (HCC) Creatinine up from baseline of 1.7 Continue IV fluid Avoid nephrotoxins Continue to monitor renal function   COPD, moderate (HCC) Not acutely exacerbated continue as needed nebulization   Tobacco  use disorder Patient declines nicotine patch   Underweight (BMI < 18.5) Dietary consulted    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  Started on XRT on 09/30/2023 No acute disease suspected   CAD S/P percutaneous coronary angioplasty No complaints of chest pain and EKG is nonacute Continue atorvastatin  Continue aspirin    History of TIA (transient ischemic attack) Continue statin.   Continue aspirin    Atherosclerosis of native arteries of extremity with intermittent claudication (HCC) AAA Carotid artery stenosis s/p endarterectomy Followed by vascular surgery Continue aspirin  Continue statin   Essential hypertension, benign Continue amlodipine    DVT prophylaxis: SCD   Consults: Dr Earnestine Blanch   Advance Care Planning:   Code Status: Full code   Family Communication: Discussed with patient's daughter present at bedside   Disposition Plan: Skilled nursing facility when medically stable and cleared by orthopedics   Subjective:  Patient's hemoglobin improved after transfusion Denies nausea vomiting abdominal pain chest pain   Physical Exam:   General: Elderly female laying in bed in no acute distress HENT:     Head: Normocephalic.  Cardiovascular:     Rate and Rhythm: Normal rate and regular rhythm.     Heart sounds: Normal heart sounds.  Pulmonary:     Effort: Pulmonary effort is normal.     Breath sounds: Normal breath sounds.  Abdominal:     Palpations: Abdomen is soft.     Tenderness: There is no abdominal tenderness.  Neurological:     General: No focal deficit present.     Mental Status: Mental status is  at baseline.   Data Reviewed:     Vitals:   10/10/23 1944 10/11/23 0315 10/11/23 0920 10/11/23 1520  BP: (!) 150/70 (!) 164/73 (!) 147/73 (!) 155/72  Pulse: 70 72 70 74  Resp: 18 17 18 17   Temp: 97.7 F (36.5 C)  97.8 F (36.6 C) 97.6 F (36.4 C)  TempSrc:    Oral  SpO2: (!) 76% 98% 99% 100%  Weight:      Height:           Latest Ref Rng & Units 10/11/2023    3:16 AM 10/10/2023    5:05 PM 10/10/2023    4:34 AM  CBC  WBC 4.0 - 10.5 K/uL 16.5  19.1  20.2   Hemoglobin 12.0 - 15.0 g/dL 8.1  8.2  7.6   Hematocrit 36.0 - 46.0 % 24.1  24.8  22.4   Platelets 150 - 400 K/uL 86  81  65        Latest Ref Rng & Units 10/11/2023    3:16 AM 10/10/2023    4:34 AM 10/09/2023    5:08 AM  BMP  Glucose 70 - 99 mg/dL 90  91  92   BUN 8 - 23 mg/dL 48  50  51   Creatinine 0.44 - 1.00 mg/dL 7.51  7.57  7.53   Sodium 135 - 145 mmol/L 135  132  135   Potassium 3.5 - 5.1 mmol/L 4.1  4.4  5.4   Chloride 98 - 111 mmol/L 104  102  102   CO2 22 - 32 mmol/L 22  21  23    Calcium  8.9 - 10.3 mg/dL 7.8  7.3  7.6      Author: Drue ONEIDA Potter, MD 10/11/2023 4:09 PM  For on call review [email or web address].

## 2023-10-11 NOTE — Discharge Instructions (Signed)
 INSTRUCTIONS AFTER Surgery  Remove items at home which could result in a fall. This includes throw rugs or furniture in walking pathways ICE to the affected joint every three hours while awake for 30 minutes at a time, for at least the first 3-5 days, and then as needed for pain and swelling.  Continue to use ice for pain and swelling. You may notice swelling that will progress down to the foot and ankle.  This is normal after surgery.  Elevate your leg when you are not up walking on it.   Continue to use the breathing machine you got in the hospital (incentive spirometer) which will help keep your temperature down.  It is common for your temperature to cycle up and down following surgery, especially at night when you are not up moving around and exerting yourself.  The breathing machine keeps your lungs expanded and your temperature down.   DIET:  As you were doing prior to hospitalization, we recommend a well-balanced diet.  DRESSING / WOUND CARE / SHOWERING  Dressing change as needed.  No showering.  Patient will follow-up at [facility] clinic orthopedics in 2 weeks for staple removal and x-rays of the left hip.  ACTIVITY  Increase activity slowly as tolerated, but follow the weight bearing instructions below.   No driving for 6 weeks or until further direction given by your physician.  You cannot drive while taking narcotics.  No lifting or carrying greater than 10 lbs. until further directed by your surgeon. Avoid periods of inactivity such as sitting longer than an hour when not asleep. This helps prevent blood clots.  You may return to work once you are authorized by your doctor.     WEIGHT BEARING  Weightbearing as tolerated on the left   EXERCISES Gait training and ambulation training with physical therapy.  CONSTIPATION  Constipation is defined medically as fewer than three stools per week and severe constipation as less than one stool per week.  Even if you have a regular  bowel pattern at home, your normal regimen is likely to be disrupted due to multiple reasons following surgery.  Combination of anesthesia, postoperative narcotics, change in appetite and fluid intake all can affect your bowels.   YOU MUST use at least one of the following options; they are listed in order of increasing strength to get the job done.  They are all available over the counter, and you may need to use some, POSSIBLY even all of these options:    Drink plenty of fluids (prune juice may be helpful) and high fiber foods Colace 100 mg by mouth twice a day  Senokot for constipation as directed and as needed Dulcolax (bisacodyl ), take with full glass of water  Miralax  (polyethylene glycol) once or twice a day as needed.  If you have tried all these things and are unable to have a bowel movement in the first 3-4 days after surgery call either your surgeon or your primary doctor.    If you experience loose stools or diarrhea, hold the medications until you stool forms back up.  If your symptoms do not get better within 1 week or if they get worse, check with your doctor.  If you experience the worst abdominal pain ever or develop nausea or vomiting, please contact the office immediately for further recommendations for treatment.   ITCHING:  If you experience itching with your medications, try taking only a single pain pill, or even half a pain pill at a time.  You can also use Benadryl  over the counter for itching or also to help with sleep.   TED HOSE STOCKINGS:  Use stockings on both legs until for at least 2 weeks or as directed by physician office. They may be removed at night for sleeping.  MEDICATIONS:  See your medication summary on the "After Visit Summary" that nursing will review with you.  You may have some home medications which will be placed on hold until you complete the course of blood thinner medication.  It is important for you to complete the blood thinner medication as  prescribed.  PRECAUTIONS:  If you experience chest pain or shortness of breath - call 911 immediately for transfer to the hospital emergency department.   If you develop a fever greater that 101 F, purulent drainage from wound, increased redness or drainage from wound, foul odor from the wound/dressing, or calf pain - CONTACT YOUR SURGEON.                                                   FOLLOW-UP APPOINTMENTS:  If you do not already have a post-op appointment, please call the office for an appointment to be seen by your surgeon.  Guidelines for how soon to be seen are listed in your "After Visit Summary", but are typically between 1-4 weeks after surgery.  OTHER INSTRUCTIONS:     MAKE SURE YOU:  Understand these instructions.  Get help right away if you are not doing well or get worse.    Thank you for letting us  be a part of your medical care team.  It is a privilege we respect greatly.  We hope these instructions will help you stay on track for a fast and full recovery!

## 2023-10-11 NOTE — Plan of Care (Signed)
   Problem: Education: Goal: Knowledge of General Education information will improve Description Including pain rating scale, medication(s)/side effects and non-pharmacologic comfort measures Outcome: Progressing   Problem: Health Behavior/Discharge Planning: Goal: Ability to manage health-related needs will improve Outcome: Progressing

## 2023-10-11 NOTE — TOC Progression Note (Addendum)
 Transition of Care [facility]) - Progression Note    Patient Details  Name: Lisa Wilkerson MRN: [id number] Date of Birth: 08/24/1945  [facility]) CM/SW Contact  Marinda Cooks, RN Phone Number: 10/11/2023, 4:27 PM  Clinical Narrative:    This CM spoke with pt at bedside and provided bed  offer list. Pt informed she wanted to speak with her son's first about bed selection. This CM received call from admission liaison Kim from [employer] at [place]  informing bed offer is being extended to pt and she can be received tomm if she accepts. TOC will cont to follow dc planning / care coordination and f/u on bed offers with pt tomm.     Expected Discharge Plan: Skilled Nursing Facility Barriers to Discharge: Continued Medical Work up  Expected Discharge Plan and Services     Post Acute Care Choice: Skilled Nursing Facility Living arrangements for the past 2 months: Single Family Home                                       Social Determinants of Health (SDOH) Interventions SDOH Screenings   Food Insecurity: No Food Insecurity (10/07/2023)  Housing: Low Risk  (10/07/2023)  Transportation Needs: No Transportation Needs (10/07/2023)  Utilities: Not At Risk (10/07/2023)  Depression (PHQ2-9): Low Risk  (01/29/2019)  Financial Resource Strain: Low Risk  (05/14/2023)   Received from [facility]  Physical Activity: Inactive (02/06/2018)  Social Connections: Unknown (10/07/2023)  Stress: No Stress Concern Present (02/06/2018)  Tobacco Use: High Risk (10/07/2023)    Readmission Risk Interventions     No data to display

## 2023-10-11 NOTE — Progress Notes (Signed)
 Occupational Therapy Treatment Patient Details Name: Lisa Wilkerson MRN: [id number] DOB: Feb 11, 1946 Today's Date: 10/11/2023   History of present illness Pt is a 78 year old female mitted with acute Left Intertrochanteric Hip Fracture post fall at home with acute blood loss anemia due to left intramuscular hematoma from the fracture site.  Status post Intramedullary nailing of Left Femur.  In PACU, course complicated by Hemorrhagic shock.    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 Pt found to have significant drainage from L hip honeycomb dressing, RN immediately notified. Pt requires MIN A to perform STS from recliner, increased time needed with cues for hand placement, MIN A to pivot back to bed with recliner (pt unable to take steps due to L hip discomfort, shuffles/pivots hips to transfer). Further ADLs and mobility deferred due to need for nursing care, MIN A for BLE and trunk management back to supine. Pt left with NT/RN in room. Discharge recommendation remains appropriate, OT will continue to follow.       If plan is discharge home, recommend the following:  A lot of help with walking and/or transfers;A lot of help with bathing/dressing/bathroom;Help with stairs or ramp for entrance;Assistance with cooking/housework   Equipment Recommendations  BSC/3in1;Tub/shower bench       Precautions / Restrictions Precautions Precautions: Fall Recall of Precautions/Restrictions: Intact Precaution/Restrictions Comments: watch honeycomb dressing site Restrictions Weight Bearing Restrictions Per Provider Order: Yes LLE Weight Bearing Per Provider Order: Weight bearing as tolerated       Mobility Bed Mobility Overal bed mobility: Needs Assistance         Sit to supine: Min assist, Used rails, HOB elevated, +2 for  physical assistance   General bed mobility comments: increased time, assist for BLE returning to supine    Transfers Overall transfer level: Needs assistance Equipment used: Rolling walker (2 wheels) Transfers: Sit to/from Stand Sit to Stand: Min assist, +2 safety/equipment           General transfer comment: MIN A to stand, increased time and cues for hand placement. NT / RN assist for line/lead mgmt. pt unable to take full steps, shuffles/pivots using RW     Balance Overall balance assessment: Needs assistance Sitting-balance support: Feet supported Sitting balance-Leahy Scale: Good     Standing balance support: Bilateral upper extremity supported, During functional activity, Reliant on assistive device for balance Standing balance-Leahy Scale: Fair                             ADL either performed or assessed with clinical judgement   ADL Overall ADL's : Needs assistance/impaired                         Toilet Transfer: Minimal assistance;BSC/3in1;Rolling walker (2 wheels) Toilet Transfer Details (indicate cue type and reason): simulated, recliner to bed         Functional mobility during ADLs: Minimal assistance;Rolling walker (2 wheels);Cueing for sequencing;Cueing for safety General ADL Comments: Pt found in recliner with significant bloody drainage from honeycomb dressing around incision site. Immediately alerted RN, NT and RN in room to assist with transfer (line/lead mgmt) and assess dressing bed level end of session, further ADLs deferred due to need for nursing care.    Extremity/Trunk Assessment  Vision       Perception     Praxis     Communication Communication Communication: No apparent difficulties   Cognition Arousal: Alert Behavior During Therapy: WFL for tasks assessed/performed Cognition: No apparent impairments             OT - Cognition Comments: pt able to recall details of PT session earlier                  Following commands: Intact        Cueing   Cueing Techniques: Verbal cues        General Comments Pt noted to have significant bloody drainage from honeycomb dressing around L hip incision site. RN and NT in room immediately to address.    Pertinent Vitals/ Pain       Pain Assessment Pain Assessment: Faces Faces Pain Scale: Hurts little more Pain Location: L hip Pain Descriptors / Indicators: Sore Pain Intervention(s): Limited activity within patient's tolerance, Monitored during session, Repositioned   Frequency  Min 2X/week        Progress Toward Goals  OT Goals(current goals can now be found in the care plan section)  Progress towards OT goals: Progressing toward goals  Acute Rehab OT Goals OT Goal Formulation: With patient/family Time For Goal Achievement: 10/22/23 Potential to Achieve Goals: Good  Plan         AM-PAC OT 6 Clicks Daily Activity     Outcome Measure   Help from another person eating meals?: None Help from another person taking care of personal grooming?: None Help from another person toileting, which includes using toliet, bedpan, or urinal?: A Lot Help from another person bathing (including washing, rinsing, drying)?: A Lot Help from another person to put on and taking off regular upper body clothing?: A Little Help from another person to put on and taking off regular lower body clothing?: A Lot 6 Click Score: 17    End of Session Equipment Utilized During Treatment: Rolling walker (2 wheels);Gait belt;Oxygen  OT Visit Diagnosis: Other abnormalities of gait and mobility (R26.89);Muscle weakness (generalized) (M62.81);Unsteadiness on feet (R26.81)   Activity Tolerance Patient tolerated treatment well   Patient Left in bed;with call bell/phone within reach;with bed alarm set;with nursing/sitter in room   Nurse Communication Mobility status (drainage from L hip dressing)        Time: 1344-1400 OT Time  Calculation (min): 16 min  Charges: OT General Charges $OT Visit: 1 Visit OT Treatments $Self Care/Home Management : 8-22 mins  Arvine Clayburn L. Tyjay Galindo, OTR/L  10/11/23, 2:14 PM

## 2023-10-12 DIAGNOSIS — S72142A Displaced intertrochanteric fracture of left femur, initial encounter for closed fracture: Secondary | ICD-10-CM | POA: Diagnosis not present

## 2023-10-12 LAB — CBC WITH DIFFERENTIAL/PLATELET
Abs Immature Granulocytes: 0.09 K/uL — ABNORMAL HIGH (ref 0.00–0.07)
Basophils Absolute: 0 K/uL (ref 0.0–0.1)
Basophils Relative: 0 %
Eosinophils Absolute: 0.1 K/uL (ref 0.0–0.5)
Eosinophils Relative: 1 %
HCT: 22.8 % — ABNORMAL LOW (ref 36.0–46.0)
Hemoglobin: 7.5 g/dL — ABNORMAL LOW (ref 12.0–15.0)
Immature Granulocytes: 1 %
Lymphocytes Relative: 8 %
Lymphs Abs: 0.9 K/uL (ref 0.7–4.0)
MCH: 31.5 pg (ref 26.0–34.0)
MCHC: 32.9 g/dL (ref 30.0–36.0)
MCV: 95.8 fL (ref 80.0–100.0)
Monocytes Absolute: 0.6 K/uL (ref 0.1–1.0)
Monocytes Relative: 5 %
Neutro Abs: 10.4 K/uL — ABNORMAL HIGH (ref 1.7–7.7)
Neutrophils Relative %: 85 %
Platelets: 91 K/uL — ABNORMAL LOW (ref 150–400)
RBC: 2.38 MIL/uL — ABNORMAL LOW (ref 3.87–5.11)
RDW: 15.9 % — ABNORMAL HIGH (ref 11.5–15.5)
WBC: 12.2 K/uL — ABNORMAL HIGH (ref 4.0–10.5)
nRBC: 0.3 % — ABNORMAL HIGH (ref 0.0–0.2)

## 2023-10-12 LAB — BASIC METABOLIC PANEL WITH GFR
Anion gap: 9 (ref 5–15)
BUN: 44 mg/dL — ABNORMAL HIGH (ref 8–23)
CO2: 24 mmol/L (ref 22–32)
Calcium: 7.9 mg/dL — ABNORMAL LOW (ref 8.9–10.3)
Chloride: 103 mmol/L (ref 98–111)
Creatinine, Ser: 2.1 mg/dL — ABNORMAL HIGH (ref 0.44–1.00)
GFR, Estimated: 24 mL/min — ABNORMAL LOW (ref >60)
Glucose, Bld: 84 mg/dL (ref 70–99)
Potassium: 4 mmol/L (ref 3.5–5.1)
Sodium: 136 mmol/L (ref 135–145)

## 2023-10-12 NOTE — TOC Progression Note (Addendum)
 Transition of Care [facility] - Progression Note    Patient Details  Name: Lisa Wilkerson MRN: [id number] Date of Birth: [birth year]  [facility]) CM/SW Contact  [place] L Tola Meas, [place] Phone Number: 10/12/2023, 10:54 AM  Clinical Narrative:     CSW met with patient. Relative was at bedside. CSW inquired about patient choice for facility. CSW advised that [facility] was patient choice.   CSW will start auth. CSW notified [place] at facility.   Expected Discharge Plan: Skilled Nursing Facility Barriers to Discharge: Continued Medical Work up  Expected Discharge Plan and Services     Post Acute Care Choice: Skilled Nursing Facility Living arrangements for the past 2 months: Single Family Home                                       Social Determinants of Health (SDOH) Interventions SDOH Screenings   Food Insecurity: No Food Insecurity (10/07/2023)  Housing: Low Risk  (10/07/2023)  Transportation Needs: No Transportation Needs (10/07/2023)  Utilities: Not At Risk (10/07/2023)  Depression (PHQ2-9): Low Risk  (01/29/2019)  Financial Resource Strain: Low Risk  (05/14/2023)   Received from [facility]  Physical Activity: Inactive (02/06/2018)  Social Connections: Unknown (10/07/2023)  Stress: No Stress Concern Present (02/06/2018)  Tobacco Use: High Risk (10/07/2023)    Readmission Risk Interventions     No data to display

## 2023-10-12 NOTE — Plan of Care (Signed)

## 2023-10-12 NOTE — Progress Notes (Signed)
 Subjective: 5 Days Post-Op Procedure(s) (LRB): FIXATION, FRACTURE, INTERTROCHANTERIC, WITH INTRAMEDULLARY ROD (Left) complicated by hemorrhagic shock.  Has been transferred from the ICU to the floor, no longer needing pressors. Patient reports pain as mild.   Patient is well, hemorrhagic shock.  S/p 4 units of pRBC. Patient will likely need SNF at discharge. Negative for chest pain and shortness of breath Fever: no Gastrointestinal:Negative for nausea and vomiting Reports she is passing some gas this AM. Has not had a BM yet.  Objective: Vital signs in last 24 hours: Temp:  [97.6 F (36.4 C)-98.2 F (36.8 C)] 97.7 F (36.5 C) (07/08 0514) Pulse Rate:  [70-95] 95 (07/08 0514) Resp:  [17-18] 18 (07/08 0514) BP: (109-177)/(72-82) 177/74 (07/08 0514) SpO2:  [89 %-100 %] 100 % (07/08 0514)  Intake/Output from previous day:  Intake/Output Summary (Last 24 hours) at 10/12/2023 0714 Last data filed at 10/11/2023 2220 Gross per 24 hour  Intake 600 ml  Output 1400 ml  Net -800 ml    Intake/Output this shift: No intake/output data recorded.  Labs: Recent Labs    10/09/23 1655 10/10/23 0434 10/10/23 1705 10/11/23 0316 10/12/23 0431  HGB 6.8* 7.6* 8.2* 8.1* 7.5*   Recent Labs    10/11/23 0316 10/12/23 0431  WBC 16.5* 12.2*  RBC 2.59* 2.38*  HCT 24.1* 22.8*  PLT 86* 91*   Recent Labs    10/11/23 0316 10/12/23 0431  NA 135 136  K 4.1 4.0  CL 104 103  CO2 22 24  BUN 48* 44*  CREATININE 2.48* 2.10*  GLUCOSE 90 84  CALCIUM  7.8* 7.9*   Recent Labs    10/09/23 1655  INR 1.1     EXAM General - Patient is Alert, Appropriate, and Oriented Extremity - Honeycomb dressings intact to the left leg. Mild serous drainage noted, on the honeycomb dressing. Thigh is swollen but compressible.  No rigidity noted.  Swelling has improved. Honeycomb dressings changed today. Intact to light touch to the left leg. Able to dorsiflex and plantarflex foot without pain. Negative  homans. Pulses intact to bilateral lower extremities.  Ambulated 3 feet with physical therapy.  Past Medical History:  Diagnosis Date   AAA (abdominal aortic aneurysm) without rupture (HCC)    a.) not candidate for endovascular repair per cardiology/vascular   Actinic keratosis 10/29/2014   L hand thenar - biopsy proven   Actinic keratosis 08/17/2017   L forearm - biopsy proven   Anterior communicating artery aneurysm (LEFT)    Aortic atherosclerosis (HCC)    Arthritis    Basal cell carcinoma of skin    Carotid stenosis    a.) s/p LEFT CEA 11/28/2015   CKD (chronic kidney disease), stage III (HCC)    COPD (chronic obstructive pulmonary disease) (HCC)    Coronary artery disease    DDD (degenerative disc disease), lumbosacral    Full dentures    History of 2019 novel coronavirus disease (COVID-19) 05/31/2023   History of bilateral cataract extraction    Hyperlipidemia    Hypertension    Left upper lobe pulmonary nodule    Long-term use of aspirin  therapy    NSTEMI (non-ST elevated myocardial infarction) (HCC) 07/2009   Peripheral vascular disease (HCC)    Psoriasis    Sleep apnea    a.) does not require nocturnal PAP therapy s/p UPPP   Squamous cell carcinoma of skin 07/13/2017   L hand dorsum SCCIS   Squamous cell carcinoma of skin 11/23/2017   L mid forearm  Squamous cell carcinoma of skin 01/07/2022   Right Shoulder - Posterior, EDC   Squamous cell carcinoma of skin 09/07/2023   R hand dorsum, EDC   Squamous cell skin cancer 06/18/2014   L lat lower leg below the knee   TIA (transient ischemic attack) 2014   Vertigo    Warthin's tumor (LEFT)     Assessment/Plan: 5 Days Post-Op Procedure(s) (LRB): FIXATION, FRACTURE, INTERTROCHANTERIC, WITH INTRAMEDULLARY ROD (Left) Principal Problem:   Closed comminuted intertrochanteric fracture of proximal end of left femur, initial encounter (HCC) Active Problems:   Carotid stenosis   Essential hypertension, benign   AAA  (abdominal aortic aneurysm) without rupture (HCC)   Atherosclerosis of native arteries of extremity with intermittent claudication (HCC)   COPD, moderate (HCC)   History of TIA (transient ischemic attack)   CAD S/P percutaneous coronary angioplasty   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   Weight as of this encounter: 43.1 kg.  Labs and vitals reviewed. Hg 7.5 this AM, BP stable 4 units of pRBC already given Continue to monitor BP and hemoglobin.   Work on a BM. Up with therapy when able, most likely will need SNF at d/c. Resume home dose of Plavix  when stable.  Dressing changes needed.  May need ABD reinforced bandage.  DVT Prophylaxis - Aspirin  and Plavix  Weight-Bearing as tolerated to left leg  Krystal Doyne, PA-C [facility] Orthopaedic Surgery 10/12/2023, 7:14 AM

## 2023-10-12 NOTE — Progress Notes (Signed)
 Nutrition Follow-up  DOCUMENTATION CODES:   Underweight, Non-severe (moderate) malnutrition in context of chronic illness  INTERVENTION:   -Continue MVI with minerals daily -Continue Boost Breeze po TID, each supplement provides 250 kcal and 9 grams of protein  -Magic cup TID with meals, each supplement provides 290 kcal and 9 grams of protein  -Continue regular diet -Check labs for potential micronutrient deficiencies which may impact taste changes and replete as needed: zinc    NUTRITION DIAGNOSIS:   Moderate Malnutrition related to chronic illness, cancer and cancer related treatments as evidenced by percent weight loss, mild fat depletion, mild muscle depletion, moderate muscle depletion, moderate fat depletion.  Ongoing  GOAL:   Patient will meet greater than or equal to 90% of their needs  Progressing   MONITOR:   PO intake, Supplement acceptance  REASON FOR ASSESSMENT:   Consult Assessment of nutrition requirement/status, Hip fracture protocol  ASSESSMENT:   Pt with medical history significant for recently diagnosed left upper lobe adenocarcinoma of the lung started on XRT on 09/30/2023, as well as history of COPD, PAD/AAA/left ICA stenosis, HTN, CAD, TIA, tobacco use, being admitted with left hip fracture sustained from an accidental fall when she tripped on her way back to bed after a trip to the bathroom.  7/3- s/p Intramedullary nailing of Left femur with cephalomedullary device   Reviewed I/O's: -800 ml x 24 hours and +8.5 L since admission  UOP: 1.4 L x 24 hours  SPoke with pt at bedside, who was pleasant and in good spirits today. Pt reports feeling better today. Pt shares that her appetite is like crap. She endorses that intake has been decreased over the past year, secondary to taste changes. Per pt, she is hungry and has the desires to eat, but eating is not appealing as everything tastes like cardboard. Typical intake is 3 meals per day (Breakfast:  bowl of cereal, Dinner: meat, starch, and vegetable). Pt lives with her son, who is disabled, and helps care of  her. He has recently stepped up in helping prepare her meals. Noted meal completions 20-100%.   Per pt, UBW is around 160-180#. Pt reports she progressively lost weight since 2016, when her husband passed away. She was maintaining a weight of 130# up until a year ago; weight loss has been progressive, but more noticeable over the past 3-4 months. Pt shares that she started radiation treatments for lung cancer recently (09/30/23), however, this has not impacted her appetite further. Reviewed wt hx; pt has experienced a 6.9% wt loss over the past month, which is significant for time frame. 3  Pt states she was started on remeron , with minimal improvement. She states her lack of appetite is mainly related to taste changes.   Discussed importance of good meal and supplement intake to promote healing. Pt enjoys Boost Breeze supplements over Ensure.   Per TOC notes, plan SNF placement once medically stable.   Medications reviewed and include colace and remeron .   Labs reviewed.    NUTRITION - FOCUSED PHYSICAL EXAM:  Flowsheet Row Most Recent Value  Orbital Region Moderate depletion  Upper Arm Region Moderate depletion  Thoracic and Lumbar Region Mild depletion  Buccal Region Mild depletion  Temple Region Mild depletion  Clavicle Bone Region Moderate depletion  Clavicle and Acromion Bone Region Moderate depletion  Scapular Bone Region Moderate depletion  Dorsal Hand Mild depletion  Patellar Region Mild depletion  Anterior Thigh Region Mild depletion  Posterior Calf Region Mild depletion  Edema (RD Assessment)  Mild  Hair Reviewed  Eyes Reviewed  Mouth Reviewed  Skin Reviewed  Nails Reviewed    Diet Order:   Diet Order             Diet regular Room service appropriate? Yes; Fluid consistency: Thin  Diet effective now                   EDUCATION NEEDS:   Education  needs have been addressed  Skin:  Skin Assessment: Skin Integrity Issues: Skin Integrity Issues:: Incisions Incisions: closed lt hip  Last BM:  10/06/23  Height:   Ht Readings from Last 1 Encounters:  10/07/23 5' 2 (1.575 m)    Weight:   Wt Readings from Last 1 Encounters:  10/07/23 43.1 kg    Ideal Body Weight:  50 kg  BMI:  Body mass index is 17.38 kg/m.  Estimated Nutritional Needs:   Kcal:  1500-1700  Protein:  75-90 grams  Fluid:  1.5-1.7 L    [name], RD, LDN, CDCES Registered Dietitian III Certified Diabetes Care and Education Specialist If unable to reach this RD, please use RD Inpatient group chat on secure chat between hours of 8am-4 pm daily

## 2023-10-12 NOTE — Care Management Important Message (Signed)
 Important Message  Patient Details  Name: Lisa Wilkerson MRN: [id number] Date of Birth: 1945/06/25   Important Message Given:  Yes - Medicare IM     Neytiri Asche W, CMA 10/12/2023, 3:38 PM

## 2023-10-12 NOTE — TOC Progression Note (Signed)
 Transition of Care [facility]) - Progression Note    Patient Details  Name: LOYE REININGER MRN: [id number] Date of Birth: 1945-10-27  [facility] of [facility]) CM/SW Contact  [place] L Lynsay [place], [place] Phone Number: 10/12/2023, 2:04 PM  Clinical Narrative:     CSW started auth with [employer]. CSW spoke to [place].   Expected Discharge Plan: Skilled Nursing Facility Barriers to Discharge: Continued Medical Work up  Expected Discharge Plan and Services     Post Acute Care Choice: Skilled Nursing Facility Living arrangements for the past 2 months: Single Family Home                                       Social Determinants of Health (SDOH) Interventions SDOH Screenings   Food Insecurity: No Food Insecurity (10/07/2023)  Housing: Low Risk  (10/07/2023)  Transportation Needs: No Transportation Needs (10/07/2023)  Utilities: Not At Risk (10/07/2023)  Depression (PHQ2-9): Low Risk  (01/29/2019)  Financial Resource Strain: Low Risk  (05/14/2023)   Received from [facility]  Physical Activity: Inactive (02/06/2018)  Social Connections: Unknown (10/07/2023)  Stress: No Stress Concern Present (02/06/2018)  Tobacco Use: High Risk (10/07/2023)    Readmission Risk Interventions     No data to display

## 2023-10-12 NOTE — Progress Notes (Signed)
 Progress Note   Patient: Lisa Wilkerson FMW:[id number] DOB: December 21, 1945 DOA: 10/07/2023     5 DOS: the patient was seen and examined on 10/12/2023    Brief hospital course: ELDANA ISIP is a 78 y.o. 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  She hit her head but did not lose consciousness.  Patient was taken to the OR for left hip fracture repair by orthopedics on 10/07/2023.  Postoperatively patient was noted to have hypotension with systolic of 60s to 80s not responsive to IV fluid and therefore placed on vasopressor support.  Repeat H&H showed hemoglobin of 5.8.  Patient is currently off of vasopressor support.  The rest of management as outlined below     Assessment and Plan:     Acute blood loss anemia Thigh hematoma Status post 4 units of packed RBC transfusion Continue blood transfusion Continue to monitor CBC closely Plan of care discussed with orthopedics  * Closed comminuted intertrochanteric fracture of proximal end of left femur, initial encounter (HCC) Continue pain control Continue PT OT PT recommending SNF Status post fracture repair by orthopedic surgery on 10/07/2023     Hemorrhagic shock-resolved Required vasopressor support Have now been weaned off vasopressor Continue to monitor blood pressure closely   Leukocytosis in the setting of UTI Chest x-ray clear Continue antibiotics complete 5 days course   Acute renal failure superimposed on stage 3b chronic kidney disease (HCC)-improved Creatinine up from baseline of 1.7 Patient received IV fluid which was discontinued today Avoid nephrotoxins Continue to monitor renal function   COPD,  moderate (HCC) Not acutely exacerbated continue as needed nebulization   Tobacco use disorder Patient declines nicotine patch   Underweight (BMI < 18.5) Dietary consulted     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  Started on XRT on 09/30/2023 No acute disease suspected   CAD S/P percutaneous coronary angioplasty No complaints of chest pain and EKG is nonacute Continue atorvastatin  Continue aspirin    History of TIA (transient ischemic attack) Continue statin.   Continue aspirin    Atherosclerosis of native arteries of extremity with intermittent claudication (HCC) AAA Carotid artery stenosis s/p endarterectomy Followed by vascular surgery Continue aspirin  Continue statin   Essential hypertension, benign Continue amlodipine    DVT prophylaxis: SCD   Consults: Dr Earnestine Blanch   Advance Care Planning:   Code Status: Full code   Family Communication: Discussed with patient's daughter present at bedside   Disposition Plan: Skilled nursing facility when medically stable and cleared by orthopedics   Subjective:  Seen and examined at bedside this morning Denies nausea vomiting abdominal pain chest pain   Physical Exam:   General: Elderly female laying in bed in no acute distress HENT:     Head: Normocephalic.  Cardiovascular:     Rate and Rhythm: Normal rate and regular rhythm.     Heart sounds: Normal heart sounds.  Pulmonary:     Effort: Pulmonary effort is normal.     Breath sounds: Normal breath sounds.  Abdominal:     Palpations: Abdomen is soft.     Tenderness: There is no abdominal tenderness.  Neurological:     General:  No focal deficit present.     Mental Status: Mental status is at baseline.   Data Reviewed:        Latest Ref Rng & Units 10/12/2023    4:31 AM 10/11/2023    3:16 AM 10/10/2023    4:34 AM  BMP  Glucose 70 - 99 mg/dL 84  90  91   BUN 8 - 23 mg/dL 44  48  50   Creatinine 0.44 - 1.00 mg/dL 7.89  7.51  7.57    Sodium 135 - 145 mmol/L 136  135  132   Potassium 3.5 - 5.1 mmol/L 4.0  4.1  4.4   Chloride 98 - 111 mmol/L 103  104  102   CO2 22 - 32 mmol/L 24  22  21    Calcium  8.9 - 10.3 mg/dL 7.9  7.8  7.3        Latest Ref Rng & Units 10/12/2023    4:31 AM 10/11/2023    3:16 AM 10/10/2023    5:05 PM  CBC  WBC 4.0 - 10.5 K/uL 12.2  16.5  19.1   Hemoglobin 12.0 - 15.0 g/dL 7.5  8.1  8.2   Hematocrit 36.0 - 46.0 % 22.8  24.1  24.8   Platelets 150 - 400 K/uL 91  86  81         Vitals:   10/12/23 0514 10/12/23 0753 10/12/23 0810 10/12/23 1614  BP: (!) 177/74 (!) 182/70 (!) 142/80 105/60  Pulse: 95 73 70 73  Resp: 18 17  17   Temp: 97.7 F (36.5 C) 97.6 F (36.4 C)  97.9 F (36.6 C)  TempSrc:  Oral  Oral  SpO2: 100% 100%  100%  Weight:      Height:        Author: Drue ONEIDA Potter, MD 10/12/2023 4:40 PM  For on call review [email or web address].

## 2023-10-12 NOTE — Progress Notes (Signed)
 Physical Therapy Treatment Patient Details Name: Lisa Wilkerson MRN: [id number] DOB: 03-Sep-1945 Today's Date: 10/12/2023   History of Present Illness Pt is a 78 year old female mitted with acute Left Intertrochanteric Hip Fracture post fall at home with acute blood loss anemia due to left intramuscular hematoma from the fracture site.  Status post Intramedullary nailing of Left Femur.  In PACU, course complicated by Hemorrhagic shock.    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  Stated she is tired today.  To EOB with min assist and bed features.  She stands with min a x 1.  While stepping to recliner she does have one small LOB recovered with min a x 1.  She stands an additional time for pre-gait/static standing activities.  She is more fatigued today but does put in max effort to her tolerance during session.  Remained in chair after session with needs met.   If plan is discharge home, recommend the following: A little help with walking and/or transfers;A little help with bathing/dressing/bathroom;Assistance with cooking/housework;Direct supervision/assist for medications management;Direct supervision/assist for financial management;Assist for transportation;Help with stairs or ramp for entrance   Can travel by private vehicle        Equipment Recommendations       Recommendations for Other Services       Precautions / Restrictions Precautions Precautions: Fall Recall of Precautions/Restrictions: Intact Precaution/Restrictions Comments: watch honeycomb dressing site Restrictions Weight Bearing Restrictions Per Provider Order: Yes LLE Weight Bearing Per Provider Order: Weight bearing as tolerated     Mobility  Bed Mobility Overal bed mobility: Needs Assistance Bed Mobility: Supine to Sit     Supine to sit: Min assist, Used rails, HOB  elevated       Patient Response: Cooperative  Transfers Overall transfer level: Needs assistance Equipment used: Rolling walker (2 wheels) Transfers: Sit to/from Stand Sit to Stand: Min assist                Ambulation/Gait Ambulation/Gait assistance: Editor, commissioning (Feet): 3 Feet Assistive device: Rolling walker (2 wheels) Gait Pattern/deviations: Step-to pattern, Antalgic Gait velocity: decreased     General Gait Details: a bit more unsteady today with one LOB recovered with assist   Stairs             Wheelchair Mobility     Tilt Bed Tilt Bed Patient Response: Cooperative  Modified Rankin (Stroke Patients Only)       Balance Overall balance assessment: Needs assistance Sitting-balance support: Feet supported Sitting balance-Leahy Scale: Good     Standing balance support: Bilateral upper extremity supported, During functional activity, Reliant on assistive device for balance Standing balance-Leahy Scale: Fair Standing balance comment: +1 hands on for safety                            Communication Communication Communication: No apparent difficulties  Cognition Arousal: Alert Behavior During Therapy: WFL for tasks assessed/performed   PT - Cognitive impairments: No apparent impairments                       PT - Cognition Comments: Pt is A and O x 4. pleasnat and cooprative. Following commands: Intact      Cueing Cueing Techniques: Verbal cues  Exercises Other Exercises Other Exercises: static  standing and pre-gait activities    General Comments        Pertinent Vitals/Pain Pain Assessment Pain Assessment: Faces Faces Pain Scale: Hurts little more Pain Location: L hip Pain Descriptors / Indicators: Sore Pain Intervention(s): Limited activity within patient's tolerance, Monitored during session, Repositioned    Home Living                          Prior Function            PT  Goals (current goals can now be found in the care plan section) Progress towards PT goals: Progressing toward goals    Frequency    7X/week      PT Plan      Co-evaluation              AM-PAC PT 6 Clicks Mobility   Outcome Measure  Help needed turning from your back to your side while in a flat bed without using bedrails?: A Little Help needed moving from lying on your back to sitting on the side of a flat bed without using bedrails?: A Little Help needed moving to and from a bed to a chair (including a wheelchair)?: A Little Help needed standing up from a chair using your arms (e.g., wheelchair or bedside chair)?: A Little Help needed to walk in hospital room?: A Lot Help needed climbing 3-5 steps with a railing? : Total 6 Click Score: 15    End of Session Equipment Utilized During Treatment: Gait belt;Oxygen Activity Tolerance: Patient limited by fatigue Patient left: in chair;with call bell/phone within reach;with family/visitor present Nurse Communication: Mobility status PT Visit Diagnosis: Muscle weakness (generalized) (M62.81);Difficulty in walking, not elsewhere classified (R26.2);Other abnormalities of gait and mobility (R26.89);History of falling (Z91.81);Pain Pain - Right/Left: Left     Time: 8886-8858 PT Time Calculation (min) (ACUTE ONLY): 28 min  Charges:    $Therapeutic Exercise: 8-22 mins $Therapeutic Activity: 8-22 mins PT General Charges $$ ACUTE PT VISIT: 1 Visit                   Lauraine Gills, PTA 10/12/23, 12:18 PM

## 2023-10-13 ENCOUNTER — Ambulatory Visit

## 2023-10-13 DIAGNOSIS — E440 Moderate protein-calorie malnutrition: Secondary | ICD-10-CM | POA: Insufficient documentation

## 2023-10-13 DIAGNOSIS — S72142A Displaced intertrochanteric fracture of left femur, initial encounter for closed fracture: Secondary | ICD-10-CM | POA: Diagnosis not present

## 2023-10-13 LAB — ZINC: Zinc: 39 ug/dL — ABNORMAL LOW (ref 44–115)

## 2023-10-13 LAB — BASIC METABOLIC PANEL WITH GFR
Anion gap: 7 (ref 5–15)
BUN: 46 mg/dL — ABNORMAL HIGH (ref 8–23)
CO2: 25 mmol/L (ref 22–32)
Calcium: 7.9 mg/dL — ABNORMAL LOW (ref 8.9–10.3)
Chloride: 105 mmol/L (ref 98–111)
Creatinine, Ser: 2.08 mg/dL — ABNORMAL HIGH (ref 0.44–1.00)
GFR, Estimated: 24 mL/min — ABNORMAL LOW (ref >60)
Glucose, Bld: 90 mg/dL (ref 70–99)
Potassium: 4.2 mmol/L (ref 3.5–5.1)
Sodium: 137 mmol/L (ref 135–145)

## 2023-10-13 LAB — CBC WITH DIFFERENTIAL/PLATELET
Abs Immature Granulocytes: 0.06 K/uL (ref 0.00–0.07)
Basophils Absolute: 0 K/uL (ref 0.0–0.1)
Basophils Relative: 0 %
Eosinophils Absolute: 0.1 K/uL (ref 0.0–0.5)
Eosinophils Relative: 2 %
HCT: 23.1 % — ABNORMAL LOW (ref 36.0–46.0)
Hemoglobin: 7.5 g/dL — ABNORMAL LOW (ref 12.0–15.0)
Immature Granulocytes: 1 %
Lymphocytes Relative: 14 %
Lymphs Abs: 1.1 K/uL (ref 0.7–4.0)
MCH: 30.9 pg (ref 26.0–34.0)
MCHC: 32.5 g/dL (ref 30.0–36.0)
MCV: 95.1 fL (ref 80.0–100.0)
Monocytes Absolute: 0.6 K/uL (ref 0.1–1.0)
Monocytes Relative: 7 %
Neutro Abs: 6.2 K/uL (ref 1.7–7.7)
Neutrophils Relative %: 76 %
Platelets: 99 K/uL — ABNORMAL LOW (ref 150–400)
RBC: 2.43 MIL/uL — ABNORMAL LOW (ref 3.87–5.11)
RDW: 15.5 % (ref 11.5–15.5)
WBC: 8.1 K/uL (ref 4.0–10.5)
nRBC: 0.2 % (ref 0.0–0.2)

## 2023-10-13 LAB — CULTURE, BLOOD (ROUTINE X 2)
Culture: NO GROWTH
Culture: NO GROWTH
Special Requests: ADEQUATE

## 2023-10-13 NOTE — Hospital Course (Addendum)
 Hospital course / significant events:   HPI: Lisa Wilkerson is a 78 y.o. 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  07/03: admitted w/ left hip fracture. Underwent repair.  Postoperatively patient was noted to have hypotension with systolic of 60s to 80s not responsive to IV fluid and therefore placed on vasopressor support, admitted to ICU.  07/04: off pressors. S/p PRBC x3 units. 07/05: PT/OT eval, planning SNF rehab. Overnight still anemic, additional unit PRBC given (total now 4 units)  07/06: started abx for UTI.  07/07-07/09: renal fxn improving, Hgb stable, TOC working on SNF rehab placement  07/10: Hgb down today 7.1, adding on iron  studies, another unit PRBC, holding on SNF dc until at least tomorrow  07/11: venofer  dose prior to discharge     Consultants:  Orthopedics  ICU  Procedures/Surgeries: L hip fracture repair      ASSESSMENT & PLAN:   Acute blood loss anemia and hemorrhagic shock - resolved  Thigh hematoma complication of fall w/ fracture repair  Status post 4 units of packed RBC transfusion this admission, another 1 ordered for today 1 unit PRBC in process today monitor CBC closely Hold any anticoag/antiplatelet Iron  studies, may benefit from venofer  infusion while here    Closed comminuted intertrochanteric fracture of proximal end of left femur, initial encounter Status post fracture repair by orthopedic surgery on 10/07/2023 Continue pain control Orthopedics following Continue PT OT - PT recommending SNF  UTI  Leukocytosis - resolved Other infectious w/u neg  antibiotics complete 5 days course   Acute renal failure superimposed on stage 3b chronic kidney  disease (HCC)-improved Creatinine baseline of 1.7 Cr 2.02 10/14/23  Avoid nephrotoxins Continue to monitor renal function   COPD, moderate  Not acutely exacerbated continue as needed nebulization   Tobacco use disorder Patient declines nicotine patch  Adenocarcinoma of upper lobe of left lung  Recently diagnosed.  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   holding aspirin  given anemia / hematoma   Essential hypertension, benign Continue amlodipine      underweight based on BMI: Body mass index is 17.38 kg/m.[name] Significantly low or high BMI is associated with higher medical risk.  Underweight - under 18  overweight - 25 to 29 obese - 30 or more Class 1 obesity: BMI of 30.0 to 34 Class 2 obesity: BMI of 35.0 to 39 Class 3 obesity: BMI of 40.0 to 49 Super Morbid Obesity: BMI 50-59 Super-super Morbid Obesity: BMI 60+ Healthy nutrition and physical activity advised as adjunct to other disease management and risk reduction treatments    DVT prophylaxis: SCD IV fluids: no continuous IV fluids  Nutrition: regualr Central lines / other devices: none  Code Status: FULL CODE ACP documentation reviewed: none on file in [facility]  TOC needs: SNF placement Medical barriers to dispo: anemia requiring PRBC today hopeful for medically stable tomorrow am.

## 2023-10-13 NOTE — Plan of Care (Signed)

## 2023-10-13 NOTE — Progress Notes (Signed)
 PROGRESS NOTE    [name]   FMW:[id number] DOB: 1945/06/24  DOA: 10/07/2023 Date of Service: 10/13/23 which is hospital day 6  PCP: Alla Amis, MD    Hospital course / significant events:      HPI: Lisa Wilkerson is a 78 y.o. 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  07/03: admitted w/ left hip fracture. Underwent repair.  Postoperatively patient was noted to have hypotension with systolic of 60s to 80s not responsive to IV fluid and therefore placed on vasopressor support, admitted to ICU.  07/04: off pressors. S/p PRBC x3 units. 07/05: PT/OT eval, planning SNF rehab. Overnight still anemic, additional unit PRBC given (total now 4 units)  07/06: started abx for UTI.  07/07-07/09: renal fxn improving, Hgb stable, no BM postop yet, TOC working on SNF rehab placement        Consultants:  Orthopedics  ICU  Procedures/Surgeries: L hip fracture repair      ASSESSMENT & PLAN:   Acute blood loss anemia and hemorrhagic shock - resolved  Thigh hematoma complication of fall w/ fracture repair  Status post 4 units of packed RBC transfusion this admittion As needed blood transfusion monitor CBC closely Hold any anticoag/antiplatelet   Closed comminuted intertrochanteric fracture of proximal end of left femur, initial encounter Status post fracture repair by orthopedic surgery on 10/07/2023 Continue pain control Orthopedics following Continue PT OT - PT recommending SNF  UTI  Leukocytosis - resolved Other infectious w/u neg  antibiotics complete 5 days course   Acute renal failure superimposed on stage 3b chronic kidney disease (HCC)-improved Creatinine  baseline of 1.7 Cr 2.08 10/13/23  Avoid nephrotoxins Continue to monitor renal function   COPD, moderate  Not acutely exacerbated continue as needed nebulization   Tobacco use disorder Patient declines nicotine patch  Adenocarcinoma of upper lobe of left lung  Recently diagnosed.  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   holding aspirin  given anemia / hematoma   Essential hypertension, benign Continue amlodipine      underweight based on BMI: Body mass index is 17.38 kg/m.[name] Significantly low or high BMI is associated with higher medical risk.  Underweight - under 18  overweight - 25 to 29 obese - 30 or more Class 1 obesity: BMI of 30.0 to 34 Class 2 obesity: BMI of 35.0 to 39 Class 3 obesity: BMI of 40.0 to 49 Super Morbid Obesity: BMI 50-59 Super-super Morbid Obesity: BMI 60+ Healthy nutrition and physical activity advised as adjunct to other disease management and risk reduction treatments    DVT prophylaxis: SCD IV fluids: no continuous IV fluids  Nutrition: regualr Central lines / other devices: none  Code Status: FULL CODE ACP documentation reviewed: none on file in [facility]  [facility] needs: SNF placement Medical barriers to dispo: none.  Subjective / Brief ROS:  Patient reports no cocnerns today Denies CP/SOB.  Pain controlled.  Denies new weakness.  Tolerating diet .  Reports no concerns w/ urination/defecation.   Family Communication: none at this time     Objective Findings:  Vitals:   10/12/23 2054 10/13/23 0420 10/13/23 0748 10/13/23 1618   BP: 139/81 (!) 168/69 (!) 164/69 (!) 142/72  Pulse: 73 68 70 69  Resp: 17 17 16 16   Temp: 98.2 F (36.8 C) 97.8 F (36.6 C) 98.5 F (36.9 C) 97.7 F (36.5 C)  TempSrc:   Oral Oral  SpO2: 100% 100% 100% 100%  Weight:      Height:        Intake/Output Summary (Last 24 hours) at 10/13/2023 1727 Last data filed at 10/13/2023 1630 Gross per 24 hour  Intake 240 ml  Output 1000 ml  Net -760 ml   Filed Weights   10/07/23 0201  Weight: 43.1 kg    Examination:  Physical Exam Constitutional:      General: She is not in acute distress. Cardiovascular:     Rate and Rhythm: Normal rate and regular rhythm.  Pulmonary:     Effort: Pulmonary effort is normal.     Breath sounds: Normal breath sounds.  Neurological:     General: No focal deficit present.     Mental Status: She is alert and oriented to person, place, and time.  Psychiatric:        Mood and Affect: Mood normal.        Behavior: Behavior normal.          Scheduled Medications:   sodium chloride    Intravenous Once   sodium chloride    Intravenous Once   acetaminophen   1,000 mg Oral Q8H   amLODipine   5 mg Oral Daily   atorvastatin   80 mg Oral Daily   Chlorhexidine  Gluconate Cloth  6 each Topical Daily   docusate sodium   100 mg Oral BID   feeding supplement  1 Container Oral TID BM   mirtazapine   15 mg Oral Daily   multivitamin with minerals  1 tablet Oral Daily    Continuous Infusions:  cefTRIAXone  (ROCEPHIN )  IV 1 g (10/13/23 0811)    PRN Medications:  bisacodyl , HYDROmorphone  (DILAUDID ) injection, ipratropium-albuterol , methocarbamol  **OR** methocarbamol  (ROBAXIN ) injection, metoCLOPramide  **OR** metoCLOPramide  (REGLAN ) injection, ondansetron  **OR** ondansetron  (ZOFRAN ) IV, oxyCODONE , oxyCODONE , senna-docusate, sodium phosphate , traMADol   Antimicrobials from admission:  Anti-infectives (From admission, onward)    Start     Dose/Rate Route Frequency Ordered Stop   10/10/23 0900  cefTRIAXone  (ROCEPHIN ) 1  g in sodium chloride  0.9 % 100 mL IVPB        1 g 200 mL/hr over 30 Minutes Intravenous Every 24 hours 10/10/23 0814     10/07/23 1900  ceFAZolin  (ANCEF ) IVPB 1 g/50 mL premix        1 g 100 mL/hr over 30 Minutes Intravenous Every 6 hours 10/07/23 1719 10/08/23 0756   10/07/23 1200  ceFAZolin  (ANCEF ) IVPB 2g/100 mL premix        2 g 200 mL/hr over 30 Minutes Intravenous  Once 10/07/23 0357 10/07/23 1247           Data Reviewed:  I have personally reviewed the following...  CBC: Recent Labs  Lab 10/07/23 1510 10/07/23 2202 10/08/23 0616 10/10/23 0434 10/10/23 1705 10/11/23 0316 10/12/23 0431 10/13/23 0320  WBC 13.3* 16.1*  15.8*   < > 20.2* 19.1* 16.5* 12.2* 8.1  NEUTROABS 11.7* 14.2*  --   --   --  14.2* 10.4* 6.2  HGB 5.8* 9.6*  9.6*   < > 7.6* 8.2* 8.1* 7.5* 7.5*  HCT 18.2* 29.0*  29.1*   < > 22.4* 24.8* 24.1* 22.8* 23.1*  MCV 96.3 95.4  95.4   < > 93.3 94.7 93.1 95.8 95.1  PLT 90* 57*  58*   < > 65* 81* 86* 91* 99*   < > = values in this interval not displayed.   Basic Metabolic Panel: Recent Labs  Lab 10/07/23 1743 10/07/23 2202 10/08/23 0437 10/08/23 1322 10/09/23 0508 10/10/23 0434 10/11/23 0316 10/12/23 0431 10/13/23 0320  NA 137   < > 140   < > 135 132* 135 136 137  K 5.7*   < > 5.8*   < > 5.4* 4.4 4.1 4.0 4.2  CL 108   < > 107   < > 102 102 104 103 105  CO2 16*   < > 22   < > 23 21* 22 24 25   GLUCOSE 176*   < > 98   < > 92 91 90 84 90  BUN 40*   < > 44*   < > 51* 50* 48* 44* 46*  CREATININE 1.69*   < > 2.09*   < > 2.46* 2.42* 2.48* 2.10* 2.08*  CALCIUM  7.8*   < > 8.1*   < > 7.6* 7.3* 7.8* 7.9* 7.9*  PHOS 5.2*  --  6.4*  --  6.0* 5.2*  --   --   --    < > = values in this interval not displayed.   GFR: Estimated Creatinine Clearance: 15.2 mL/min (A) (by C-G formula based on SCr of 2.08 mg/dL (H)). Liver Function Tests: Recent Labs  Lab 10/07/23 1743 10/08/23 0437 10/09/23 0508 10/10/23 0434  ALBUMIN  2.3* 2.5* 2.3* 2.0*   No results  for input(s): LIPASE, AMYLASE in the last 168 hours. No results for input(s): AMMONIA in the last 168 hours. Coagulation Profile: Recent Labs  Lab 10/07/23 2202 10/08/23 0616 10/08/23 1712 10/09/23 0508 10/09/23 1655  INR 1.3* 1.4* 1.2 1.2 1.1   Cardiac Enzymes: Recent Labs  Lab 10/08/23 0755  CKTOTAL 68   BNP (last 3 results) No results for input(s): PROBNP in the last 8760 hours. HbA1C: No results for input(s): HGBA1C in the last 72 hours. CBG: Recent Labs  Lab 10/07/23 1706 10/08/23 0745  GLUCAP 156* 98   Lipid Profile: No results for input(s): CHOL, HDL, LDLCALC, TRIG, CHOLHDL, LDLDIRECT in the last 72 hours. Thyroid  Function Tests: No results for input(s): TSH, T4TOTAL, FREET4, T3FREE, THYROIDAB in the last 72 hours. Anemia Panel: No results for input(s): VITAMINB12, FOLATE, FERRITIN, TIBC, IRON, RETICCTPCT in the last 72 hours. Most Recent Urinalysis On File:     Component Value Date/Time   COLORURINE YELLOW (A) 10/08/2023 1136   APPEARANCEUR CLOUDY (A) 10/08/2023 1136   LABSPEC 1.018 10/08/2023 1136   PHURINE 5.0 10/08/2023 1136   GLUCOSEU NEGATIVE 10/08/2023 1136   HGBUR LARGE (A) 10/08/2023 1136   BILIRUBINUR NEGATIVE 10/08/2023 1136   KETONESUR NEGATIVE 10/08/2023 1136   PROTEINUR NEGATIVE 10/08/2023 1136   NITRITE NEGATIVE 10/08/2023 1136   LEUKOCYTESUR SMALL (A) 10/08/2023 1136   Sepsis Labs: @LABRCNTIP (procalcitonin:4,lacticidven:4) Microbiology: Recent Results (from the past 240 hours)  Culture, blood (Routine X 2) w Reflex to ID Panel     Status: None   Collection Time: 10/08/23 10:46 AM   Specimen: BLOOD  Result Value Ref Range Status   Specimen Description BLOOD BLOOD LEFT HAND  Final   Special Requests   Final    BOTTLES DRAWN AEROBIC AND ANAEROBIC Blood Culture results may not be optimal due to an inadequate volume of blood received in culture bottles   Culture   Final    NO GROWTH 5  DAYS Performed at [facility], [address] [place], [place] [address]    Report Status 10/13/2023 FINAL  Final  Culture, blood (Routine X 2) w Reflex to ID Panel     Status: None   Collection Time: 10/08/23 10:47 AM   Specimen: BLOOD  Result Value Ref Range Status   Specimen Description BLOOD LEFT ANTECUBITAL  Final   Special Requests   Final    BOTTLES DRAWN AEROBIC AND ANAEROBIC Blood Culture adequate volume   Culture   Final    NO GROWTH 5 DAYS Performed at [facility], [address], [place], [place] [address]    Report Status 10/13/2023 FINAL  Final      Radiology Studies last 3 days: No results found.    Marisa Hufstetler, DO [facility] 10/13/2023, 5:27 PM    Dictation software may have been used to generate the above note. Typos may occur and escape review in typed/dictated notes. Please contact Dr Marsa directly for clarity if needed.  Staff may message me via secure chat in Epic  but this may not receive an immediate response,  please page me for urgent matters!  If 7PM-7AM, please contact night coverage [email or web address]

## 2023-10-13 NOTE — Progress Notes (Signed)
 Physical Therapy Treatment Patient Details Name: Lisa Wilkerson MRN: [id number] DOB: 28-Jul-1945 Today's Date: 10/13/2023   History of Present Illness Pt is a 78 year old female mitted with acute Left Intertrochanteric Hip Fracture post fall at home with acute blood loss anemia due to left intramuscular hematoma from the fracture site.  Status post Intramedullary nailing of Left Femur.  In PACU, course complicated by Hemorrhagic shock.    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 L hip dressing saturated through dressing and two chucks pads, Care Team notified through secure chat. Pt on 3L O2 upon arrival with SpO2 at 100%, however able to complete full PT session on RA maintaining 98-100% SpO2  Pt left on 2L O2 post session - Nursing/MD notified. Increased gait distance with RW to 59ft x 2 with seated rest break due to significant fatigue, no dizziness, Hgb currently at 7.5  Pt remains very motivated to improve and will benefit from initial recs for STR once medically cleared for d/c. Pt positioned to comfort in recliner with all needs in reach.   If plan is discharge home, recommend the following: A little help with walking and/or transfers;A little help with bathing/dressing/bathroom;Assistance with cooking/housework;Direct supervision/assist for medications management;Direct supervision/assist for financial management;Assist for transportation;Help with stairs or ramp for entrance   Can travel by private vehicle     No  Equipment Recommendations  Other (comment) (Defer to next level of care)    Recommendations for Other Services       Precautions / Restrictions Precautions Precautions: Fall Recall of Precautions/Restrictions: Intact Precaution/Restrictions Comments: watch honeycomb dressing site due to heavy drainage Restrictions Weight  Bearing Restrictions Per Provider Order: Yes LLE Weight Bearing Per Provider Order: Weight bearing as tolerated     Mobility  Bed Mobility Overal bed mobility: Needs Assistance Bed Mobility: Supine to Sit     Supine to sit: Min assist, Used rails, HOB elevated     General bed mobility comments: increased time, assist for L LE management    Transfers Overall transfer level: Needs assistance Equipment used: Rolling walker (2 wheels) Transfers: Sit to/from Stand Sit to Stand: Min assist           General transfer comment: ModA to stand from lower chair    Ambulation/Gait Ambulation/Gait assistance: Min assist Gait Distance (Feet):  (6) Assistive device: Rolling walker (2 wheels) Gait Pattern/deviations: Step-to pattern, Antalgic Gait velocity: decreased     General Gait Details: 99% on RA during activity, fatigues quickly requiring seated rest break during gait 46ft x 2   Stairs             Wheelchair Mobility     Tilt Bed    Modified Rankin (Stroke Patients Only)       Balance Overall balance assessment: Needs assistance Sitting-balance support: Feet supported Sitting balance-Leahy Scale: Good     Standing balance support: Bilateral upper extremity supported, During functional activity, Reliant on assistive device for balance Standing balance-Leahy Scale: Fair Standing balance comment: Fatigues very quickly with dynamic tasks/gait                            Communication Communication Communication: No apparent difficulties  Cognition Arousal: Alert Behavior During Therapy: WFL for tasks assessed/performed   PT - Cognitive impairments: No apparent  impairments                       PT - Cognition Comments: Pt is A and O x 4. pleasnat and cooprative. Following commands: Intact      Cueing Cueing Techniques: Verbal cues  Exercises General Exercises - Lower Extremity Ankle Circles/Pumps: AROM, Both, 10 reps Long Arc  Quad: AROM, Left, 10 reps Heel Slides: AAROM, Left, 5 reps    General Comments General comments (skin integrity, edema, etc.):  (L hip dressing saturated upon arrival which had been changed fairly recently, care team notified)      Pertinent Vitals/Pain Pain Assessment Pain Assessment: Faces Faces Pain Scale: Hurts little more Pain Location: L hip Pain Descriptors / Indicators: Sore Pain Intervention(s): Monitored during session    Home Living                          Prior Function            PT Goals (current goals can now be found in the care plan section) Acute Rehab PT Goals Patient Stated Goal: To return to PLOF Progress towards PT goals: Progressing toward goals    Frequency    7X/week      PT Plan      Co-evaluation              AM-PAC PT 6 Clicks Mobility   Outcome Measure  Help needed turning from your back to your side while in a flat bed without using bedrails?: A Little Help needed moving from lying on your back to sitting on the side of a flat bed without using bedrails?: A Little Help needed moving to and from a bed to a chair (including a wheelchair)?: A Little Help needed standing up from a chair using your arms (e.g., wheelchair or bedside chair)?: A Little Help needed to walk in hospital room?: A Lot Help needed climbing 3-5 steps with a railing? : Total 6 Click Score: 15    End of Session Equipment Utilized During Treatment: Gait belt Activity Tolerance: Patient limited by fatigue Patient left: in chair;with call bell/phone within reach;with chair alarm set Nurse Communication: Mobility status;Other (comment) (Saturated L hip dressing, 99% on RA upon exertion) PT Visit Diagnosis: Muscle weakness (generalized) (M62.81);Difficulty in walking, not elsewhere classified (R26.2);Other abnormalities of gait and mobility (R26.89);History of falling (Z91.81);Pain Pain - Right/Left: Left Pain - part of body: Hip     Time:  1312-1340 PT Time Calculation (min) (ACUTE ONLY): 28 min  Charges:    $Therapeutic Exercise: 8-22 mins $Therapeutic Activity: 8-22 mins PT General Charges $$ ACUTE PT VISIT: 1 Visit                    Darice Bohr, PTA  Darice JAYSON Bohr 10/13/2023, 4:58 PM

## 2023-10-13 NOTE — Progress Notes (Signed)
 Subjective: 6 Days Post-Op Procedure(s) (LRB): FIXATION, FRACTURE, INTERTROCHANTERIC, WITH INTRAMEDULLARY ROD (Left) complicated by hemorrhagic shock.  Has been transferred from the ICU to the floor, no longer needing pressors. Patient reports pain as mild.   Patient is well, hemorrhagic shock.  S/p 4 units of pRBC. Patient will likely need SNF at discharge. Negative for chest pain and shortness of breath Fever: no Gastrointestinal:Negative for nausea and vomiting Reports she is passing some gas this AM. Has not had a BM yet.  Objective: Vital signs in last 24 hours: Temp:  [97.6 F (36.4 C)-98.2 F (36.8 C)] 97.8 F (36.6 C) (07/09 0420) Pulse Rate:  [68-73] 68 (07/09 0420) Resp:  [17] 17 (07/09 0420) BP: (105-182)/(60-81) 168/69 (07/09 0420) SpO2:  [100 %] 100 % (07/09 0420)  Intake/Output from previous day:  Intake/Output Summary (Last 24 hours) at 10/13/2023 0703 Last data filed at 10/12/2023 1600 Gross per 24 hour  Intake 240 ml  Output 1000 ml  Net -760 ml    Intake/Output this shift: No intake/output data recorded.  Labs: Recent Labs    10/10/23 1705 10/11/23 0316 10/12/23 0431 10/13/23 0320  HGB 8.2* 8.1* 7.5* 7.5*   Recent Labs    10/12/23 0431 10/13/23 0320  WBC 12.2* 8.1  RBC 2.38* 2.43*  HCT 22.8* 23.1*  PLT 91* 99*   Recent Labs    10/12/23 0431 10/13/23 0320  NA 136 137  K 4.0 4.2  CL 103 105  CO2 24 25  BUN 44* 46*  CREATININE 2.10* 2.08*  GLUCOSE 84 90  CALCIUM  7.9* 7.9*   No results for input(s): LABPT, INR in the last 72 hours.    EXAM General - Patient is Alert, Appropriate, and Oriented Extremity -bulky dressings intact to the left leg. Mild serous drainage noted, on the honeycomb dressing. Thigh is swollen but compressible.  No rigidity noted.  Swelling has improved. Bulky dressings intact with minimal drainage. Intact to light touch to the left leg. Able to dorsiflex and plantarflex foot without pain. Negative  homans. Pulses intact to bilateral lower extremities.  Ambulated 3 feet with physical therapy.  Past Medical History:  Diagnosis Date   AAA (abdominal aortic aneurysm) without rupture (HCC)    a.) not candidate for endovascular repair per cardiology/vascular   Actinic keratosis 10/29/2014   L hand thenar - biopsy proven   Actinic keratosis 08/17/2017   L forearm - biopsy proven   Anterior communicating artery aneurysm (LEFT)    Aortic atherosclerosis (HCC)    Arthritis    Basal cell carcinoma of skin    Carotid stenosis    a.) s/p LEFT CEA 11/28/2015   CKD (chronic kidney disease), stage III (HCC)    COPD (chronic obstructive pulmonary disease) (HCC)    Coronary artery disease    DDD (degenerative disc disease), lumbosacral    Full dentures    History of 2019 novel coronavirus disease (COVID-19) 05/31/2023   History of bilateral cataract extraction    Hyperlipidemia    Hypertension    Left upper lobe pulmonary nodule    Long-term use of aspirin  therapy    NSTEMI (non-ST elevated myocardial infarction) (HCC) 07/2009   Peripheral vascular disease (HCC)    Psoriasis    Sleep apnea    a.) does not require nocturnal PAP therapy s/p UPPP   Squamous cell carcinoma of skin 07/13/2017   L hand dorsum SCCIS   Squamous cell carcinoma of skin 11/23/2017   L mid forearm    Squamous  cell carcinoma of skin 01/07/2022   Right Shoulder - Posterior, EDC   Squamous cell carcinoma of skin 09/07/2023   R hand dorsum, EDC   Squamous cell skin cancer 06/18/2014   L lat lower leg below the knee   TIA (transient ischemic attack) 2014   Vertigo    Warthin's tumor (LEFT)     Assessment/Plan: 6 Days Post-Op Procedure(s) (LRB): FIXATION, FRACTURE, INTERTROCHANTERIC, WITH INTRAMEDULLARY ROD (Left) Principal Problem:   Closed comminuted intertrochanteric fracture of proximal end of left femur, initial encounter (HCC) Active Problems:   Carotid stenosis   Essential hypertension, benign   AAA  (abdominal aortic aneurysm) without rupture (HCC)   Atherosclerosis of native arteries of extremity with intermittent claudication (HCC)   COPD, moderate (HCC)   History of TIA (transient ischemic attack)   CAD S/P percutaneous coronary angioplasty   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   Weight as of this encounter: 43.1 kg.  Labs and vitals reviewed. Hg 7.5 this AM, BP stable 4 units of pRBC already given Continue to monitor BP and hemoglobin.   Work on a BM. Up with therapy when able, most likely will need SNF at d/c. Resume home dose of Plavix  when stable.  Dressing changes needed.  May need ABD reinforced bandage.  DVT Prophylaxis - Aspirin  and Plavix  Weight-Bearing as tolerated to left leg  Krystal Doyne, PA-C [facility] 10/13/2023, 7:03 AM

## 2023-10-13 NOTE — Plan of Care (Signed)
   Problem: Education: Goal: Knowledge of General Education information will improve Description: Including pain rating scale, medication(s)/side effects and non-pharmacologic comfort measures Outcome: Progressing   Problem: Coping: Goal: Level of anxiety will decrease Outcome: Progressing

## 2023-10-14 DIAGNOSIS — S72142A Displaced intertrochanteric fracture of left femur, initial encounter for closed fracture: Secondary | ICD-10-CM | POA: Diagnosis not present

## 2023-10-14 LAB — BASIC METABOLIC PANEL WITH GFR
Anion gap: 7 (ref 5–15)
BUN: 44 mg/dL — ABNORMAL HIGH (ref 8–23)
CO2: 25 mmol/L (ref 22–32)
Calcium: 7.9 mg/dL — ABNORMAL LOW (ref 8.9–10.3)
Chloride: 105 mmol/L (ref 98–111)
Creatinine, Ser: 2.02 mg/dL — ABNORMAL HIGH (ref 0.44–1.00)
GFR, Estimated: 25 mL/min — ABNORMAL LOW (ref >60)
Glucose, Bld: 99 mg/dL (ref 70–99)
Potassium: 4.4 mmol/L (ref 3.5–5.1)
Sodium: 137 mmol/L (ref 135–145)

## 2023-10-14 LAB — CBC
HCT: 21.6 % — ABNORMAL LOW (ref 36.0–46.0)
Hemoglobin: 7.1 g/dL — ABNORMAL LOW (ref 12.0–15.0)
MCH: 31.4 pg (ref 26.0–34.0)
MCHC: 32.9 g/dL (ref 30.0–36.0)
MCV: 95.6 fL (ref 80.0–100.0)
Platelets: 113 K/uL — ABNORMAL LOW (ref 150–400)
RBC: 2.26 MIL/uL — ABNORMAL LOW (ref 3.87–5.11)
RDW: 15.7 % — ABNORMAL HIGH (ref 11.5–15.5)
WBC: 8 K/uL (ref 4.0–10.5)
nRBC: 0.6 % — ABNORMAL HIGH (ref 0.0–0.2)

## 2023-10-14 LAB — IRON AND TIBC
Iron: 7 ug/dL — ABNORMAL LOW (ref 28–170)
Saturation Ratios: 5 % — ABNORMAL LOW (ref 10.4–31.8)
TIBC: 133 ug/dL — ABNORMAL LOW (ref 250–450)
UIBC: 126 ug/dL

## 2023-10-14 LAB — FERRITIN: Ferritin: 222 ng/mL (ref 11–307)

## 2023-10-14 LAB — PREPARE RBC (CROSSMATCH)

## 2023-10-14 MED ORDER — SODIUM CHLORIDE 0.9% IV SOLUTION: Freq: Once | INTRAVENOUS | Status: AC

## 2023-10-14 NOTE — Plan of Care (Signed)

## 2023-10-14 NOTE — Progress Notes (Signed)
 PROGRESS NOTE    Lisa Wilkerson   FMW:[id number] DOB: December 08, 1945  DOA: 10/07/2023 Date of Service: 10/14/23 which is hospital day 7  PCP: Alla Amis, MD    Hospital course / significant events:   HPI: Lisa Wilkerson is a 78 y.o. 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  07/03: admitted w/ left hip fracture. Underwent repair.  Postoperatively patient was noted to have hypotension with systolic of 60s to 80s not responsive to IV fluid and therefore placed on vasopressor support, admitted to ICU.  07/04: off pressors. S/p PRBC x3 units. 07/05: PT/OT eval, planning SNF rehab. Overnight still anemic, additional unit PRBC given (total now 4 units)  07/06: started abx for UTI.  07/07-07/09: renal fxn improving, Hgb stable, TOC working on SNF rehab placement  07/10: Hgb down today 7.1, adding on iron studies, another unit PRBC, holding on SNF dc until at least tomorrow      Consultants:  Orthopedics  ICU  Procedures/Surgeries: L hip fracture repair      ASSESSMENT & PLAN:   Acute blood loss anemia and hemorrhagic shock - resolved  Thigh hematoma complication of fall w/ fracture repair  Status post 4 units of packed RBC transfusion this admission, another 1 ordered for today 1 unit PRBC in process today monitor CBC closely Hold any anticoag/antiplatelet Iron studies, may benefit from venofer infusion while here    Closed comminuted intertrochanteric fracture of proximal end of left femur, initial encounter Status post fracture repair by orthopedic surgery on 10/07/2023 Continue pain control Orthopedics following Continue PT OT - PT recommending SNF  UTI  Leukocytosis -  resolved Other infectious w/u neg  antibiotics complete 5 days course   Acute renal failure superimposed on stage 3b chronic kidney disease (HCC)-improved Creatinine baseline of 1.7 Cr 2.02 10/14/23  Avoid nephrotoxins Continue to monitor renal function   COPD, moderate  Not acutely exacerbated continue as needed nebulization   Tobacco use disorder Patient declines nicotine patch  Adenocarcinoma of upper lobe of left lung  Recently diagnosed.  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   holding aspirin  given anemia / hematoma   Essential hypertension, benign Continue amlodipine      underweight based on BMI: Body mass index is 17.38 kg/m.[name] Significantly low or high BMI is associated with higher medical risk.  Underweight - under 18  overweight - 25 to 29 obese - 30 or more Class 1 obesity: BMI of 30.0 to 34 Class 2 obesity: BMI of 35.0 to 39 Class 3 obesity: BMI of 40.0 to 49 Super Morbid Obesity: BMI 50-59 Super-super Morbid Obesity: BMI 60+ Healthy nutrition and physical activity advised as adjunct to other disease management and risk reduction treatments    DVT prophylaxis: SCD IV fluids: no continuous IV fluids  Nutrition: regualr Central lines / other devices: none  Code Status: FULL CODE  ACP documentation reviewed: none on file in [facility]  [facility] needs: SNF placement Medical barriers to dispo: anemia requiring PRBC today hopeful for medically stable tomorrow am.             Subjective / Brief ROS:  Patient reports no cocnerns today other than feeling tired / low  energy today  Denies dysuria Denies CP/SOB.  Pain controlled.  Denies new weakness.  Tolerating diet .   Family Communication: none at this time     Objective Findings:  Vitals:   10/13/23 1618 10/13/23 1933 10/14/23 0242 10/14/23 0816  BP: (!) 142/72 (!) 143/65 (!) 146/63 (!) 145/69  Pulse: 69 74 72 73  Resp: 16 17 16 18   Temp: 97.7 F (36.5 C) 97.7 F (36.5 C) 97.9 F (36.6 C) (!) 97.5 F (36.4 C)  TempSrc: Oral   Oral  SpO2: 100% 100% 99% 100%  Weight:      Height:        Intake/Output Summary (Last 24 hours) at 10/14/2023 1249 Last data filed at 10/14/2023 1039 Gross per 24 hour  Intake 269.21 ml  Output 1400 ml  Net -1130.79 ml   Filed Weights   10/07/23 0201  Weight: 43.1 kg    Examination:  Physical Exam Constitutional:      General: She is not in acute distress. Cardiovascular:     Rate and Rhythm: Normal rate and regular rhythm.  Pulmonary:     Effort: Pulmonary effort is normal.     Breath sounds: Normal breath sounds.  Neurological:     General: No focal deficit present.     Mental Status: She is alert and oriented to person, place, and time.  Psychiatric:        Mood and Affect: Mood normal.        Behavior: Behavior normal.          Scheduled Medications:   sodium chloride    Intravenous Once   sodium chloride    Intravenous Once   sodium chloride    Intravenous Once   acetaminophen   1,000 mg Oral Q8H   amLODipine   5 mg Oral Daily   atorvastatin   80 mg Oral Daily   docusate sodium   100 mg Oral BID   feeding supplement  1 Container Oral TID BM   mirtazapine   15 mg Oral Daily   multivitamin with minerals  1 tablet Oral Daily    Continuous Infusions:    PRN Medications:  bisacodyl , HYDROmorphone  (DILAUDID ) injection, ipratropium-albuterol , methocarbamol  **OR** methocarbamol  (ROBAXIN ) injection, metoCLOPramide  **OR** metoCLOPramide  (REGLAN ) injection, ondansetron  **OR** ondansetron  (ZOFRAN ) IV, oxyCODONE , oxyCODONE , senna-docusate,  sodium phosphate , traMADol   Antimicrobials from admission:  Anti-infectives (From admission, onward)    Start     Dose/Rate Route Frequency Ordered Stop   10/10/23 0900  cefTRIAXone  (ROCEPHIN ) 1 g in sodium chloride  0.9 % 100 mL IVPB        1 g 200 mL/hr over 30 Minutes Intravenous Every 24 hours 10/10/23 0814 10/14/23 1039   10/07/23 1900  ceFAZolin  (ANCEF ) IVPB 1 g/50 mL premix        1 g 100 mL/hr over 30 Minutes Intravenous Every 6 hours 10/07/23 1719 10/08/23 0756   10/07/23 1200  ceFAZolin  (ANCEF ) IVPB 2g/100 mL premix        2 g 200 mL/hr over 30 Minutes Intravenous  Once 10/07/23 0357 10/07/23 1247           Data Reviewed:  I have personally reviewed the following...  CBC: Recent Labs  Lab  10/07/23 1510 10/07/23 2202 10/08/23 9383 10/10/23 1705 10/11/23 0316 10/12/23 0431 10/13/23 0320 10/14/23 0249  WBC 13.3* 16.1*  15.8*   < > 19.1* 16.5* 12.2* 8.1 8.0  NEUTROABS 11.7* 14.2*  --   --  14.2* 10.4* 6.2  --   HGB 5.8* 9.6*  9.6*   < > 8.2* 8.1* 7.5* 7.5* 7.1*  HCT 18.2* 29.0*  29.1*   < > 24.8* 24.1* 22.8* 23.1* 21.6*  MCV 96.3 95.4  95.4   < > 94.7 93.1 95.8 95.1 95.6  PLT 90* 57*  58*   < > 81* 86* 91* 99* 113*   < > = values in this interval not displayed.   Basic Metabolic Panel: Recent Labs  Lab 10/07/23 1743 10/07/23 2202 10/08/23 0437 10/08/23 1322 10/09/23 0508 10/10/23 0434 10/11/23 0316 10/12/23 0431 10/13/23 0320 10/14/23 0249  NA 137   < > 140   < > 135 132* 135 136 137 137  K 5.7*   < > 5.8*   < > 5.4* 4.4 4.1 4.0 4.2 4.4  CL 108   < > 107   < > 102 102 104 103 105 105  CO2 16*   < > 22   < > 23 21* 22 24 25 25   GLUCOSE 176*   < > 98   < > 92 91 90 84 90 99  BUN 40*   < > 44*   < > 51* 50* 48* 44* 46* 44*  CREATININE 1.69*   < > 2.09*   < > 2.46* 2.42* 2.48* 2.10* 2.08* 2.02*  CALCIUM  7.8*   < > 8.1*   < > 7.6* 7.3* 7.8* 7.9* 7.9* 7.9*  PHOS 5.2*  --  6.4*  --  6.0* 5.2*  --   --   --   --    < > = values in this interval not  displayed.   GFR: Estimated Creatinine Clearance: 15.6 mL/min (A) (by C-G formula based on SCr of 2.02 mg/dL (H)). Liver Function Tests: Recent Labs  Lab 10/07/23 1743 10/08/23 0437 10/09/23 0508 10/10/23 0434  ALBUMIN  2.3* 2.5* 2.3* 2.0*   No results for input(s): LIPASE, AMYLASE in the last 168 hours. No results for input(s): AMMONIA in the last 168 hours. Coagulation Profile: Recent Labs  Lab 10/07/23 2202 10/08/23 0616 10/08/23 1712 10/09/23 0508 10/09/23 1655  INR 1.3* 1.4* 1.2 1.2 1.1   Cardiac Enzymes: Recent Labs  Lab 10/08/23 0755  CKTOTAL 68   BNP (last 3 results) No results for input(s): PROBNP in the last 8760 hours. HbA1C: No results for input(s): HGBA1C in the last 72 hours. CBG: Recent Labs  Lab 10/07/23 1706 10/08/23 0745  GLUCAP 156* 98   Lipid Profile: No results for input(s): CHOL, HDL, LDLCALC, TRIG, CHOLHDL, LDLDIRECT in the last 72 hours. Thyroid  Function Tests: No results for input(s): TSH, T4TOTAL, FREET4, T3FREE, THYROIDAB in the last 72 hours. Anemia Panel: No results for input(s): VITAMINB12, FOLATE, FERRITIN, TIBC, IRON, RETICCTPCT in the last 72 hours. Most Recent Urinalysis On File:     Component Value Date/Time   COLORURINE YELLOW (A) 10/08/2023 1136   APPEARANCEUR CLOUDY (A) 10/08/2023 1136   LABSPEC 1.018 10/08/2023 1136   PHURINE 5.0 10/08/2023 1136   GLUCOSEU NEGATIVE 10/08/2023 1136   HGBUR LARGE (A) 10/08/2023 1136   BILIRUBINUR NEGATIVE 10/08/2023 1136   KETONESUR NEGATIVE 10/08/2023 1136   PROTEINUR NEGATIVE 10/08/2023 1136   NITRITE NEGATIVE 10/08/2023 1136   LEUKOCYTESUR SMALL (A) 10/08/2023  1136   Sepsis Labs: @LABRCNTIP (procalcitonin:4,lacticidven:4) Microbiology: Recent Results (from the past 240 hours)  Culture, blood (Routine X 2) w Reflex to ID Panel     Status: None   Collection Time: 10/08/23 10:46 AM   Specimen: BLOOD  Result Value Ref Range Status    Specimen Description BLOOD BLOOD LEFT HAND  Final   Special Requests   Final    BOTTLES DRAWN AEROBIC AND ANAEROBIC Blood Culture results may not be optimal due to an inadequate volume of blood received in culture bottles   Culture   Final    NO GROWTH 5 DAYS Performed at [facility], [address], [place], [place] [address]    Report Status 10/13/2023 FINAL  Final  Culture, blood (Routine X 2) w Reflex to ID Panel     Status: None   Collection Time: 10/08/23 10:47 AM   Specimen: BLOOD  Result Value Ref Range Status   Specimen Description BLOOD LEFT ANTECUBITAL  Final   Special Requests   Final    BOTTLES DRAWN AEROBIC AND ANAEROBIC Blood Culture adequate volume   Culture   Final    NO GROWTH 5 DAYS Performed at [facility], [address], [place], [place] [address]    Report Status 10/13/2023 FINAL  Final      Radiology Studies last 3 days: No results found.    Winta Barcelo, DO [facility] 10/14/2023, 12:49 PM    Dictation software may have been used to generate the above note. Typos may occur and escape review in typed/dictated notes. Please contact Dr Marsa directly for clarity if needed.  Staff may message me via secure chat in Epic  but this may not receive an immediate response,  please page me for urgent matters!  If 7PM-7AM, please contact night coverage [email or web address]

## 2023-10-14 NOTE — Progress Notes (Signed)
 Physical Therapy Treatment Patient Details Name: Lisa Wilkerson MRN: [id number] DOB: 1945/08/10 Today's Date: 10/14/2023   History of Present Illness Pt is a 78 year old female mitted with acute Left Intertrochanteric Hip Fracture post fall at home with acute blood loss anemia due to left intramuscular hematoma from the fracture site.  Status post Intramedullary nailing of Left Femur.  In PACU, course complicated by Hemorrhagic shock.    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 Pt remains very weak and lethargic which has affected her functional progress in PT. Hopeful that tolerance will increase once Hgb stabilizes. Pt seen this am for LE strengthening exercises and repositioning in bed. 2/10 L hip pain with ROM. Will continue to progress per POC. Initial recs for STR remain appropriate.    If plan is discharge home, recommend the following: A little help with walking and/or transfers;A little help with bathing/dressing/bathroom;Assistance with cooking/housework;Direct supervision/assist for medications management;Direct supervision/assist for financial management;Assist for transportation;Help with stairs or ramp for entrance   Can travel by private vehicle     No  Equipment Recommendations  Other (comment) (Defer to next level of care)    Recommendations for Other Services       Precautions / Restrictions Precautions Precautions: Fall Recall of Precautions/Restrictions: Intact Precaution/Restrictions Comments: watch honeycomb dressing site due to heavy drainage Restrictions Weight Bearing Restrictions Per Provider Order: Yes LLE Weight Bearing Per Provider Order: Weight bearing as tolerated     Mobility  Bed Mobility               General bed mobility  comments:  (deferred due to Hgb 7.1)    Transfers                   General transfer comment: deferred    Ambulation/Gait               General Gait Details: deferred   Stairs             Wheelchair Mobility     Tilt Bed    Modified Rankin (Stroke Patients Only)       Balance                                            Communication Communication Communication: No apparent difficulties  Cognition Arousal: Lethargic Behavior During Therapy: WFL for tasks assessed/performed   PT - Cognitive impairments: No apparent impairments                       PT - Cognition Comments: Pt is A and O x 4. pleasnat and cooprative. Following commands: Intact      Cueing Cueing Techniques: Verbal cues  Exercises General Exercises - Lower Extremity Ankle Circles/Pumps: AROM, Both, 10 reps Gluteal Sets: AROM, Both, 10 reps Heel Slides: AAROM, Left, 10 reps, Supine Hip ABduction/ADduction: AAROM, Left, 10 reps, Supine Straight Leg Raises: AAROM, Left, 5 reps, Supine    General Comments General comments (skin integrity, edema, etc.): Pt remains very lethargic, Hgb trended down again to 7.1, awaiting transfusion      Pertinent Vitals/Pain Pain Assessment Pain Assessment: Faces Faces Pain Scale: Hurts  a little bit Pain Location: L hip Pain Descriptors / Indicators: Sore Pain Intervention(s): Monitored during session    Home Living                          Prior Function            PT Goals (current goals can now be found in the care plan section) Acute Rehab PT Goals Patient Stated Goal: To return to PLOF    Frequency    7X/week      PT Plan      Co-evaluation              AM-PAC PT 6 Clicks Mobility   Outcome Measure  Help needed turning from your back to your side while in a flat bed without using bedrails?: A Lot Help needed moving from lying on your back to sitting on the side of a  flat bed without using bedrails?: A Little Help needed moving to and from a bed to a chair (including a wheelchair)?: A Little Help needed standing up from a chair using your arms (e.g., wheelchair or bedside chair)?: A Little Help needed to walk in hospital room?: A Lot Help needed climbing 3-5 steps with a railing? : Total 6 Click Score: 14    End of Session   Activity Tolerance: Patient limited by fatigue Patient left: in bed;with call bell/phone within reach;with bed alarm set;with SCD's reapplied Nurse Communication: Mobility status;Other (comment) (Holding mobility, awaiting transfusion) PT Visit Diagnosis: Muscle weakness (generalized) (M62.81);Difficulty in walking, not elsewhere classified (R26.2);Other abnormalities of gait and mobility (R26.89);History of falling (Z91.81);Pain Pain - Right/Left: Left Pain - part of body: Hip     Time: 1219-1230 PT Time Calculation (min) (ACUTE ONLY): 11 min  Charges:    $Therapeutic Exercise: 8-22 mins PT General Charges $$ ACUTE PT VISIT: 1 Visit                    Darice Bohr, PTA  Darice JAYSON Bohr 10/14/2023, 1:42 PM

## 2023-10-14 NOTE — TOC Progression Note (Signed)
 Transition of Care [facility]) - Progression Note    Patient Details  Name: Lisa Wilkerson MRN: [id number] Date of Birth: 03/30/1946  [facility]) CM/SW Contact  [place] L Phill Steck, [place] Phone Number: 10/14/2023, 11:41 AM  Clinical Narrative:     Shara approved. Patient going to [facility] at [place]. Patient not medically ready for discharge.   Expected Discharge Plan: Skilled Nursing Facility Barriers to Discharge: Continued Medical Work up  Expected Discharge Plan and Services     Post Acute Care Choice: Skilled Nursing Facility Living arrangements for the past 2 months: Single Family Home                                       Social Determinants of Health (SDOH) Interventions SDOH Screenings   Food Insecurity: No Food Insecurity (10/07/2023)  Housing: Low Risk  (10/07/2023)  Transportation Needs: No Transportation Needs (10/07/2023)  Utilities: Not At Risk (10/07/2023)  Depression (PHQ2-9): Low Risk  (01/29/2019)  Financial Resource Strain: Low Risk  (05/14/2023)   Received from [facility]  Physical Activity: Inactive (02/06/2018)  Social Connections: Unknown (10/07/2023)  Stress: No Stress Concern Present (02/06/2018)  Tobacco Use: High Risk (10/07/2023)    Readmission Risk Interventions     No data to display

## 2023-10-14 NOTE — Progress Notes (Signed)
 Subjective: 7 Days Post-Op Procedure(s) (LRB): FIXATION, FRACTURE, INTERTROCHANTERIC, WITH INTRAMEDULLARY ROD (Left) complicated by hemorrhagic shock.  Has been transferred from the ICU to the floor, no longer needing pressors. Patient reports pain as mild.   Patient is well, hemorrhagic shock.  S/p 4 units of pRBC. Patient will likely need SNF at discharge. Negative for chest pain and shortness of breath Fever: no Gastrointestinal:Negative for nausea and vomiting Reports she is passing some gas this AM. Has not had a BM yet.  Objective: Vital signs in last 24 hours: Temp:  [97.7 F (36.5 C)-98.5 F (36.9 C)] 97.9 F (36.6 C) (07/10 0242) Pulse Rate:  [69-74] 72 (07/10 0242) Resp:  [16-17] 16 (07/10 0242) BP: (142-164)/(63-72) 146/63 (07/10 0242) SpO2:  [99 %-100 %] 99 % (07/10 0242)  Intake/Output from previous day:  Intake/Output Summary (Last 24 hours) at 10/14/2023 0715 Last data filed at 10/14/2023 0700 Gross per 24 hour  Intake 240 ml  Output 1400 ml  Net -1160 ml    Intake/Output this shift: No intake/output data recorded.  Labs: Recent Labs    10/12/23 0431 10/13/23 0320 10/14/23 0249  HGB 7.5* 7.5* 7.1*   Recent Labs    10/13/23 0320 10/14/23 0249  WBC 8.1 8.0  RBC 2.43* 2.26*  HCT 23.1* 21.6*  PLT 99* 113*   Recent Labs    10/13/23 0320 10/14/23 0249  NA 137 137  K 4.2 4.4  CL 105 105  CO2 25 25  BUN 46* 44*  CREATININE 2.08* 2.02*  GLUCOSE 90 99  CALCIUM  7.9* 7.9*   No results for input(s): LABPT, INR in the last 72 hours.    EXAM General - Patient is Alert, Appropriate, and Oriented Extremity -bulky dressings intact to the left leg. Mild serous drainage noted, on the honeycomb dressing. Thigh is swollen but compressible.  No rigidity noted.  Swelling has improved. Bulky dressings intact with minimal drainage. Intact to light touch to the left leg. Able to dorsiflex and plantarflex foot without pain. Negative homans. Pulses  intact to bilateral lower extremities.  Ambulated 6 feet with physical therapy.  Past Medical History:  Diagnosis Date   AAA (abdominal aortic aneurysm) without rupture (HCC)    a.) not candidate for endovascular repair per cardiology/vascular   Actinic keratosis 10/29/2014   L hand thenar - biopsy proven   Actinic keratosis 08/17/2017   L forearm - biopsy proven   Anterior communicating artery aneurysm (LEFT)    Aortic atherosclerosis (HCC)    Arthritis    Basal cell carcinoma of skin    Carotid stenosis    a.) s/p LEFT CEA 11/28/2015   CKD (chronic kidney disease), stage III (HCC)    COPD (chronic obstructive pulmonary disease) (HCC)    Coronary artery disease    DDD (degenerative disc disease), lumbosacral    Full dentures    History of 2019 novel coronavirus disease (COVID-19) 05/31/2023   History of bilateral cataract extraction    Hyperlipidemia    Hypertension    Left upper lobe pulmonary nodule    Long-term use of aspirin  therapy    NSTEMI (non-ST elevated myocardial infarction) (HCC) 07/2009   Peripheral vascular disease (HCC)    Psoriasis    Sleep apnea    a.) does not require nocturnal PAP therapy s/p UPPP   Squamous cell carcinoma of skin 07/13/2017   L hand dorsum SCCIS   Squamous cell carcinoma of skin 11/23/2017   L mid forearm    Squamous cell carcinoma  of skin 01/07/2022   Right Shoulder - Posterior, EDC   Squamous cell carcinoma of skin 09/07/2023   R hand dorsum, EDC   Squamous cell skin cancer 06/18/2014   L lat lower leg below the knee   TIA (transient ischemic attack) 2014   Vertigo    Warthin's tumor (LEFT)     Assessment/Plan: 7 Days Post-Op Procedure(s) (LRB): FIXATION, FRACTURE, INTERTROCHANTERIC, WITH INTRAMEDULLARY ROD (Left) Principal Problem:   Closed comminuted intertrochanteric fracture of proximal end of left femur, initial encounter (HCC) Active Problems:   Carotid stenosis   Essential hypertension, benign   AAA (abdominal  aortic aneurysm) without rupture (HCC)   Atherosclerosis of native arteries of extremity with intermittent claudication (HCC)   COPD, moderate (HCC)   History of TIA (transient ischemic attack)   CAD S/P percutaneous coronary angioplasty   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)   Closed left hip fracture, initial encounter (HCC)   Anemia   Stage 3b chronic kidney disease (HCC)   Underweight (BMI < 18.5)   Hypotension   Acute renal failure superimposed on stage 3b chronic kidney disease (HCC)   Tobacco use disorder   Leukocytosis   Malnutrition of moderate degree  Estimated body mass index is 17.38 kg/m as calculated from the following:   Height as of this encounter: 5' 2 (1.575 m).   Weight as of this encounter: 43.1 kg.  Labs and vitals reviewed. Hg 7.1 this AM, BP stable 4 units of pRBC already given Continue to monitor BP and hemoglobin.   Work on a BM. Up with therapy when able, most likely will need SNF at d/c. Resume home dose of Plavix  when stable.  Dressing changes needed.  Reinforce this morning by nursing.  DVT Prophylaxis - Aspirin  and Plavix  Weight-Bearing as tolerated to left leg  Krystal Doyne, PA-C [facility] 10/14/2023, 7:15 AM

## 2023-10-15 DIAGNOSIS — S72142A Displaced intertrochanteric fracture of left femur, initial encounter for closed fracture: Secondary | ICD-10-CM | POA: Diagnosis not present

## 2023-10-15 LAB — BPAM RBC
Blood Product Expiration Date: 202508082359
ISSUE DATE / TIME: 202507101331
Unit Type and Rh: 6200

## 2023-10-15 LAB — BASIC METABOLIC PANEL WITH GFR
Anion gap: 9 (ref 5–15)
BUN: 42 mg/dL — ABNORMAL HIGH (ref 8–23)
CO2: 25 mmol/L (ref 22–32)
Calcium: 8.2 mg/dL — ABNORMAL LOW (ref 8.9–10.3)
Chloride: 106 mmol/L (ref 98–111)
Creatinine, Ser: 1.85 mg/dL — ABNORMAL HIGH (ref 0.44–1.00)
GFR, Estimated: 28 mL/min — ABNORMAL LOW (ref >60)
Glucose, Bld: 85 mg/dL (ref 70–99)
Potassium: 4 mmol/L (ref 3.5–5.1)
Sodium: 140 mmol/L (ref 135–145)

## 2023-10-15 LAB — TYPE AND SCREEN
ABO/RH(D): A POS
Antibody Screen: NEGATIVE
Unit division: 0

## 2023-10-15 LAB — CBC
HCT: 27 % — ABNORMAL LOW (ref 36.0–46.0)
Hemoglobin: 8.9 g/dL — ABNORMAL LOW (ref 12.0–15.0)
MCH: 30.8 pg (ref 26.0–34.0)
MCHC: 33 g/dL (ref 30.0–36.0)
MCV: 93.4 fL (ref 80.0–100.0)
Platelets: 134 K/uL — ABNORMAL LOW (ref 150–400)
RBC: 2.89 MIL/uL — ABNORMAL LOW (ref 3.87–5.11)
RDW: 16.5 % — ABNORMAL HIGH (ref 11.5–15.5)
WBC: 9.8 K/uL (ref 4.0–10.5)
nRBC: 0.8 % — ABNORMAL HIGH (ref 0.0–0.2)

## 2023-10-15 MED ORDER — METHOCARBAMOL 500 MG PO TABS: 500.0000 mg | ORAL_TABLET | Freq: Three times a day (TID) | ORAL | Status: AC | PRN

## 2023-10-15 MED ORDER — BISACODYL 10 MG RE SUPP: 10.0000 mg | Freq: Every day | RECTAL | Status: AC | PRN

## 2023-10-15 MED ORDER — ADULT MULTIVITAMIN W/MINERALS CH: 1.0000 | ORAL_TABLET | Freq: Every day | ORAL | Status: AC

## 2023-10-15 MED ORDER — ONDANSETRON HCL 4 MG PO TABS: 4.0000 mg | ORAL_TABLET | Freq: Four times a day (QID) | ORAL | Status: DC | PRN

## 2023-10-15 MED ORDER — ACETAMINOPHEN 500 MG PO TABS: 1000.0000 mg | ORAL_TABLET | Freq: Three times a day (TID) | ORAL | Status: AC

## 2023-10-15 MED ORDER — IRON SUCROSE 200 MG IVPB - SIMPLE MED
200.0000 mg | Freq: Once | Status: AC
  Administered 2023-10-15: 200 mg via INTRAVENOUS
  Filled 2023-10-15: qty 200

## 2023-10-15 MED ORDER — AMLODIPINE BESYLATE 5 MG PO TABS: 5.0000 mg | ORAL_TABLET | Freq: Every day | ORAL | Status: DC

## 2023-10-15 MED ORDER — DOCUSATE SODIUM 100 MG PO CAPS: 100.0000 mg | ORAL_CAPSULE | Freq: Two times a day (BID) | ORAL | Status: AC

## 2023-10-15 NOTE — Progress Notes (Signed)
 Dsg change performed at L hip

## 2023-10-15 NOTE — TOC Transition Note (Signed)
 Transition of Care [facility]) - Discharge Note   Patient Details  Name: Lisa Wilkerson MRN: [id number] Date of Birth: 02-06-1946  Transition of Care [facility]) CM/SW Contact:  Racheal LITTIE Schimke, RN Phone Number: 10/15/2023, 9:07 AM   Clinical Narrative: To discharge to [facility] via [facility].     Final next level of care: Skilled Nursing Facility Barriers to Discharge: Barriers Resolved   Patient Goals and CMS Choice Patient states their goals for this hospitalization and ongoing recovery are:: To SNF          Discharge Placement              Patient chooses bed at: [facility] 349) Patient to be transferred to facility by: [facility] Name of family member notified: Son, [name] Patient and family notified of of transfer: 10/15/23  Discharge Plan and Services Additional resources added to the After Visit Summary for       Post Acute Care Choice: Skilled Nursing Facility            DME Agency: NA       HH Arranged: NA HH Agency: NA        Social Drivers of Health (SDOH) Interventions SDOH Screenings   Food Insecurity: No Food Insecurity (10/07/2023)  Housing: Low Risk  (10/07/2023)  Transportation Needs: No Transportation Needs (10/07/2023)  Utilities: Not At Risk (10/07/2023)  Depression (PHQ2-9): Low Risk  (01/29/2019)  Financial Resource Strain: Low Risk  (05/14/2023)   Received from [facility]  Physical Activity: Inactive (02/06/2018)  Social Connections: Unknown (10/07/2023)  Stress: No Stress Concern Present (02/06/2018)  Tobacco Use: High Risk (10/07/2023)     Readmission Risk Interventions     No data to display

## 2023-10-15 NOTE — Discharge Summary (Signed)
 Physician Discharge Summary   Patient: Lisa Wilkerson MRN: [id number]  DOB: [birth year]   Admit:     Date of Admission: 10/07/2023 Admitted from: home   Discharge: Date of discharge: 10/15/23 Disposition: Skilled nursing facility Condition at discharge: good  CODE STATUS: FULL CODe     Discharge Physician: Laneta Blunt, DO [facility]     PCP: Alla Amis, MD  Recommendations for Outpatient Follow-up:  Follow up with PCP Alla Amis, MD in 2-4 weeks Please obtain labs/tests: CBC, BMP in 2 weeks Orthopedic follow up in 7-10 days    Discharge Instructions     Call MD for:  redness, tenderness, or signs of infection (pain, swelling, redness, odor or green/yellow discharge around incision site)   Complete by: As directed    Call MD for:  severe uncontrolled pain   Complete by: As directed    Call MD for:  temperature >100.4   Complete by: As directed    Diet general   Complete by: As directed    Regular diet   Discharge wound care:   Complete by: As directed    Reinforce dressing daily / prn if soiled   Increase activity slowly   Complete by: As directed          Discharge Diagnoses: Principal Problem:   Closed comminuted intertrochanteric fracture of proximal end of left femur, initial encounter (HCC) Active Problems:   Anemia   Hypotension   Acute renal failure superimposed on stage 3b chronic kidney disease (HCC)   Leukocytosis   COPD, moderate (HCC)   Carotid stenosis   Essential hypertension, benign   AAA (abdominal aortic aneurysm) without rupture (HCC)   Atherosclerosis of native arteries of extremity with intermittent claudication (HCC)   History of TIA (transient ischemic attack)   CAD S/P percutaneous coronary angioplasty   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  07/03: admitted w/ left hip fracture. Underwent repair.  Postoperatively patient was noted to have hypotension with systolic of 60s to 80s not responsive to IV fluid and therefore placed on vasopressor support, admitted to ICU.  07/04: off pressors. S/p PRBC x3 units. 07/05: PT/OT eval, planning SNF rehab. Overnight still anemic, additional unit PRBC given (total now 4 units)  07/06: started abx for UTI.  07/07-07/09: renal fxn improving, Hgb stable, TOC working on SNF rehab placement  07/10: Hgb down today 7.1, adding on iron  studies, another unit PRBC, holding on SNF dc until at least tomorrow  07/11: venofer  dose prior to discharge     Consultants:  Orthopedics  ICU  Procedures/Surgeries: L hip fracture repair      ASSESSMENT & PLAN:   Acute blood loss anemia and hemorrhagic shock - resolved  Thigh hematoma complication of fall w/ fracture repair  Status post 5 units of  packed RBC transfusion this admission monitor CBC closely Hold any anticoag/antiplatelet  Closed comminuted intertrochanteric fracture of proximal end of left femur, initial encounter Status post fracture repair by orthopedic surgery on 10/07/2023 Continue pain control Orthopedics following Continue PT OT  DVT ppx holding pharmacologic ppx given anemia / bleed risk, TED hose recommended   UTI  Leukocytosis - resolved Other infectious w/u neg  antibiotics completed 5 days course   Acute renal failure superimposed on stage 3b chronic kidney disease  (HCC)-improved Creatinine baseline of 1.7 Cr 2.02 10/14/23  Avoid nephrotoxins Continue to monitor renal function periodically   COPD, moderate  Not acutely exacerbated continue as needed nebulization / home inhaler    Tobacco use disorder Patient declines nicotine patch  Adenocarcinoma of upper lobe of left lung  Recently diagnosed.  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   holding aspirin  given anemia / hematoma   Essential hypertension, benign Continue amlodipine      underweight based on BMI: Body mass index is 17.38 kg/m.Lisa Wilkerson Significantly low or high BMI is associated with higher medical risk.  Underweight - under 18  overweight - 25 to 29 obese - 30 or more Class 1 obesity: BMI of 30.0 to 34 Class 2 obesity: BMI of 35.0 to 39 Class 3 obesity: BMI of 40.0 to 49 Super Morbid Obesity: BMI 50-59 Super-super Morbid Obesity: BMI 60+ Healthy nutrition and physical activity advised as adjunct to other disease management and risk reduction treatments            Discharge Instructions  Allergies as of 10/15/2023       Reactions   Codeine Other (See Comments)   Other: She states it paralyzes her and she can't move.        Medication List     PAUSE taking these medications    aspirin  81 MG tablet Wait to take this until your doctor or other care provider tells you to start again. Take 81 mg by mouth daily.   clobetasol  ointment 0.05 % Wait to take this until your doctor or other care provider tells you to start again. Commonly known as: TEMOVATE  Apply topically as  directed. Qd ti bid aa psoriasis on body until clear, then prn flares, avoid face, groin, axilla   clopidogrel  75 MG tablet Wait to take this until your doctor or other care provider tells you to start again. Commonly known as: PLAVIX  Take 1 tablet (75 mg total) by mouth daily.       STOP taking these medications    amoxicillin -clavulanate 875-125 MG tablet Commonly known as: AUGMENTIN    dexamethasone  2 MG tablet Commonly known as: DECADRON    Skyrizi  Pen 150 MG/ML pen Generic drug: risankizumab -rzaa   sulfamethoxazole -trimethoprim  400-80 MG tablet Commonly known as: BACTRIM        TAKE these medications    acetaminophen  500 MG tablet Commonly known as: TYLENOL  Take 2 tablets (1,000 mg total) by mouth every 8 (eight) hours.   Advair  HFA 230-21 MCG/ACT inhaler Generic drug: fluticasone -salmeterol Inhale 2 puffs into the lungs every 12 (twelve) hours.   albuterol  108 (90 Base) MCG/ACT inhaler Commonly known as: Proventil  HFA Inhale 2 puffs into the  lungs every 6 (six) hours as needed for wheezing.   amLODipine  5 MG tablet Commonly known as: NORVASC  Take 1 tablet (5 mg total) by mouth daily. Start taking on: October 16, 2023 What changed:  medication strength how much to take   atorvastatin  80 MG tablet Commonly known as: LIPITOR  Take 1 tablet (80 mg total) by mouth daily. What changed: when to take this   bisacodyl  10 MG suppository Commonly known as: DULCOLAX Place 1 suppository (10 mg total) rectally daily as needed for moderate constipation.   Cholecalciferol 25 MCG (1000 UT) tablet Take 2,000 Units by mouth daily.   clotrimazole -betamethasone  cream Commonly known as: LOTRISONE  Apply topically 2 (two) times daily What changed:  how much to take when to take this reasons to take this   docusate sodium  100 MG capsule Commonly known as: COLACE Take 1 capsule (100 mg total) by mouth 2 (two) times daily.   ipratropium-albuterol  0.5-2.5 (3) MG/3ML  Soln Commonly known as: DUONEB Take 3 mLs by nebulization 4 (four) times daily as needed for Shortness of Breath   isosorbide  mononitrate 30 MG 24 hr tablet Commonly known as: IMDUR  Take 1 tablet (30 mg total) by mouth daily.   lisinopril  5 MG tablet Commonly known as: ZESTRIL  Take 1 tablet (5 mg total) by mouth daily.   methocarbamol  500 MG tablet Commonly known as: ROBAXIN  Take 1 tablet (500 mg total) by mouth every 8 (eight) hours as needed for muscle spasms.   mirtazapine  15 MG tablet Commonly known as: REMERON  Take 1 tablet (15 mg total) by mouth Nightly. What changed: when to take this   multivitamin with minerals Tabs tablet Take 1 tablet by mouth daily. Start taking on: October 16, 2023   ondansetron  4 MG tablet Commonly known as: ZOFRAN  Take 1 tablet (4 mg total) by mouth every 6 (six) hours as needed for nausea.   oxyCODONE  5 MG immediate release tablet Commonly known as: Oxy IR/ROXICODONE  Take 0.5-1 tablets (2.5-5 mg total) by mouth every 4 (four) hours as needed for moderate pain (pain score 4-6) (pain score 4-6).   traMADol  50 MG tablet Commonly known as: ULTRAM  Take 1 tablet (50 mg total) by mouth every 6 (six) hours as needed (moderate pain, other narcotics cause side effects).               Discharge Care Instructions  (From admission, onward)           Start     Ordered   10/15/23 0000  Discharge wound care:       Comments: Reinforce dressing daily / prn if soiled   10/15/23 1004             Follow-up Information     Verlinda Boas, PA-C Follow up in 2 week(s).   Specialty: Orthopedic Surgery Why: For staple removal and x-rays of the left hip Contact information: [address] [place] [address] [phone]                 Allergies  Allergen Reactions   Codeine Other (See Comments)    Other: She states it paralyzes her and she can't move.     Subjective: pt feeling well, mild cough but  smokers cough not worrisome, no N/V, toelrating diet, pain controlled   Discharge Exam: BP (!) 172/75 (BP Location: Left Arm)   Pulse 77   Temp 98.7 F (37.1 C)   Resp 17   Ht 5' 2 (1.575 m)  Wt 43.1 kg   SpO2 97%   BMI 17.38 kg/m  General: Pt is alert, awake, not in acute distress Cardiovascular: RRR, S1/S2 +, no rubs, no gallops Respiratory: CTA bilaterally, no wheezing, no rhonchi Abdominal: Soft, NT, ND, bowel sounds + Extremities: no edema, no cyanosis     The results of significant diagnostics from this hospitalization (including imaging, microbiology, ancillary and laboratory) are listed below for reference.     Microbiology: Recent Results (from the past 240 hours)  Culture, blood (Routine X 2) w Reflex to ID Panel     Status: None   Collection Time: 10/08/23 10:46 AM   Specimen: BLOOD  Result Value Ref Range Status   Specimen Description BLOOD BLOOD LEFT HAND  Final   Special Requests   Final    BOTTLES DRAWN AEROBIC AND ANAEROBIC Blood Culture results may not be optimal due to an inadequate volume of blood received in culture bottles   Culture   Final    NO GROWTH 5 DAYS Performed at [facility], [address] [place], [place] [address]    Report Status 10/13/2023 FINAL  Final  Culture, blood (Routine X 2) w Reflex to ID Panel     Status: None   Collection Time: 10/08/23 10:47 AM   Specimen: BLOOD  Result Value Ref Range Status   Specimen Description BLOOD LEFT ANTECUBITAL  Final   Special Requests   Final    BOTTLES DRAWN AEROBIC AND ANAEROBIC Blood Culture adequate volume   Culture   Final    NO GROWTH 5 DAYS Performed at [facility], [address] [place], [place] [address]    Report Status 10/13/2023 FINAL  Final     Labs: BNP (last 3 results) No results for input(s): BNP in the last 8760 hours. Basic Metabolic Panel: Recent Labs  Lab 10/09/23 0508 10/10/23 0434 10/11/23 0316 10/12/23 0431 10/13/23 0320  10/14/23 0249 10/15/23 0351  NA 135 132* 135 136 137 137 140  K 5.4* 4.4 4.1 4.0 4.2 4.4 4.0  CL 102 102 104 103 105 105 106  CO2 23 21* 22 24 25 25 25   GLUCOSE 92 91 90 84 90 99 85  BUN 51* 50* 48* 44* 46* 44* 42*  CREATININE 2.46* 2.42* 2.48* 2.10* 2.08* 2.02* 1.85*  CALCIUM  7.6* 7.3* 7.8* 7.9* 7.9* 7.9* 8.2*  PHOS 6.0* 5.2*  --   --   --   --   --    Liver Function Tests: Recent Labs  Lab 10/09/23 0508 10/10/23 0434  ALBUMIN  2.3* 2.0*   No results for input(s): LIPASE, AMYLASE in the last 168 hours. No results for input(s): AMMONIA in the last 168 hours. CBC: Recent Labs  Lab 10/11/23 0316 10/12/23 0431 10/13/23 0320 10/14/23 0249 10/15/23 0351  WBC 16.5* 12.2* 8.1 8.0 9.8  NEUTROABS 14.2* 10.4* 6.2  --   --   HGB 8.1* 7.5* 7.5* 7.1* 8.9*  HCT 24.1* 22.8* 23.1* 21.6* 27.0*  MCV 93.1 95.8 95.1 95.6 93.4  PLT 86* 91* 99* 113* 134*   Cardiac Enzymes: No results for input(s): CKTOTAL, CKMB, CKMBINDEX, TROPONINI in the last 168 hours. BNP: Invalid input(s): POCBNP CBG: No results for input(s): GLUCAP in the last 168 hours. D-Dimer No results for input(s): DDIMER in the last 72 hours. Hgb A1c No results for input(s): HGBA1C in the last 72 hours. Lipid Profile No results for input(s): CHOL, HDL, LDLCALC, TRIG, CHOLHDL, LDLDIRECT in the last 72 hours. Thyroid  function studies No  results for input(s): TSH, T4TOTAL, T3FREE, THYROIDAB in the last 72 hours.  Invalid input(s): FREET3 Anemia work up Recent Labs    10/14/23 0249  FERRITIN 222  TIBC 133*  IRON  7*   Urinalysis    Component Value Date/Time   COLORURINE YELLOW (A) 10/08/2023 1136   APPEARANCEUR CLOUDY (A) 10/08/2023 1136   LABSPEC 1.018 10/08/2023 1136   PHURINE 5.0 10/08/2023 1136   GLUCOSEU NEGATIVE 10/08/2023 1136   HGBUR LARGE (A) 10/08/2023 1136   BILIRUBINUR NEGATIVE 10/08/2023 1136   KETONESUR NEGATIVE 10/08/2023 1136   PROTEINUR NEGATIVE  10/08/2023 1136   NITRITE NEGATIVE 10/08/2023 1136   LEUKOCYTESUR SMALL (A) 10/08/2023 1136   Sepsis Labs Recent Labs  Lab 10/12/23 0431 10/13/23 0320 10/14/23 0249 10/15/23 0351  WBC 12.2* 8.1 8.0 9.8   Microbiology Recent Results (from the past 240 hours)  Culture, blood (Routine X 2) w Reflex to ID Panel     Status: None   Collection Time: 10/08/23 10:46 AM   Specimen: BLOOD  Result Value Ref Range Status   Specimen Description BLOOD BLOOD LEFT HAND  Final   Special Requests   Final    BOTTLES DRAWN AEROBIC AND ANAEROBIC Blood Culture results may not be optimal due to an inadequate volume of blood received in culture bottles   Culture   Final    NO GROWTH 5 DAYS Performed at [facility], [address] [place], [place] [address]    Report Status 10/13/2023 FINAL  Final  Culture, blood (Routine X 2) w Reflex to ID Panel     Status: None   Collection Time: 10/08/23 10:47 AM   Specimen: BLOOD  Result Value Ref Range Status   Specimen Description BLOOD LEFT ANTECUBITAL  Final   Special Requests   Final    BOTTLES DRAWN AEROBIC AND ANAEROBIC Blood Culture adequate volume   Culture   Final    NO GROWTH 5 DAYS Performed at [facility], [address], [place], [place] [address]    Report Status 10/13/2023 FINAL  Final   Imaging DG Chest Port 1 View Result Date: 10/08/2023 CLINICAL DATA:  Leukocytosis. EXAM: PORTABLE CHEST 1 VIEW COMPARISON:  10/07/2023 FINDINGS: Lungs are hyperexpanded. Clip/marker identified left mid lung, as before. No pulmonary edema, pleural effusion, or focal consolidation. The cardio pericardial silhouette is enlarged. Bones are diffusely demineralized. Telemetry leads overlie the chest. IMPRESSION: Hyperexpansion without acute cardiopulmonary findings. Electronically Signed   By: Camellia Candle M.D.   On: 10/08/2023 08:44   CT ABDOMEN PELVIS WO CONTRAST Result Date: 10/07/2023 CLINICAL DATA:  Hypotension after left hip fracture  repair, concern for retroperitoneal bleed. EXAM: CT ABDOMEN AND PELVIS WITHOUT CONTRAST TECHNIQUE: Multidetector CT imaging of the abdomen and pelvis was performed following the standard protocol without IV contrast. RADIATION DOSE REDUCTION: This exam was performed according to the departmental dose-optimization program which includes automated exposure control, adjustment of the mA and/or kV according to patient size and/or use of iterative reconstruction technique. COMPARISON:  CT 08/21/2022 FINDINGS: Lower chest: Trace right pleural effusion. No acute basilar airspace disease. Hepatobiliary: Unremarkable unenhanced appearance of the liver. Gallbladder physiologically distended, no calcified stone. No biliary dilatation. Pancreas: No ductal dilatation or inflammation. Spleen: Normal in size without focal abnormality. Adrenals/Urinary Tract: No adrenal nodule. Bilateral renal parenchymal atrophy. Bilateral renal cysts and hyperdense lesions. No hydronephrosis. Renal vascular calcifications and possible nonobstructing renal calculi. The urinary bladder is decompressed by Foley catheter. Stomach/Bowel: No bowel obstruction. No bowel inflammation. Moderate  to large colonic stool burden. Normal appendix. Elongated stomach. Vascular/Lymphatic: Infrarenal aortic aneurysm of 4.4 cm, previously 4.2 cm. Dense aortic calcifications and mural thrombus. Indistinct aortic wall with stranding extending to the iliac bifurcation was present on prior exam, and is not significantly changed. There is dense iliac atherosclerosis. No evidence of retroperitoneal hemorrhage or retroperitoneal fluid. No gross bulky adenopathy. Calcified bilateral inguinal lymph nodes. Reproductive: Uterus and bilateral adnexa are unremarkable. Other: No significant free fluid in the abdomen or pelvis. No free air. Musculoskeletal: Femoral intramedullary nail and trans trochanteric screw fixation of proximal femur fracture. Heterogeneous enlargement of  the left gluteal musculature with edema and air likely combination of hemorrhage and postsurgical change. There are also subcutaneous hematomas superficial to the gluteal musculature, series 2, image 52. this measures at least 7.2 cm. IMPRESSION: 1. Infrarenal aortic aneurysm of 4.4 cm, previously 4.2 cm. Indistinct aortic wall with stranding extending to the iliac bifurcation was present on prior exam, and is not significantly changed. Abdominal aortic aneurysm measuring 4.4 cm. Recommend CTA or MRA, as appropriate, in 12 months and referral to vascular specialist. Reference: Journal of Vascular Surgery 67.1 (2018): 2-77. J Am Coll Radiol 2013;10:789-794. 2. No evidence of retroperitoneal hemorrhage or retroperitoneal fluid. 3. Femoral intramedullary nail and trans trochanteric screw fixation of proximal femur fracture. Heterogeneous enlargement of the left gluteal musculature with edema and air likely combination of hemorrhage and postsurgical change. There are also subcutaneous hematomas superficial to the gluteal musculature. 4. Bilateral renal parenchymal atrophy. Bilateral renal cysts and hyperdense lesions. Renal vascular calcifications and possible nonobstructing renal calculi. 5. Trace right pleural effusion. Aortic Atherosclerosis (ICD10-I70.0). Electronically Signed   By: Andrea Gasman M.D.   On: 10/07/2023 19:11   DG HIP UNILAT WITH PELVIS 2-3 VIEWS LEFT Result Date: 10/07/2023 CLINICAL DATA:  Elective surgery. EXAM: DG HIP (WITH OR WITHOUT PELVIS) 2-3V LEFT COMPARISON:  Preoperative imaging FINDINGS: Seven fluoroscopic spot views of the hip submitted from the operating room. Femoral intramedullary nail with trans trochanteric and distal locking screw fixation traverse proximal femur fracture. Fluoroscopy time 1 minutes 39 seconds. Dose 11.45 mGy. IMPRESSION: Intraoperative fluoroscopy during proximal femur fracture fixation. Electronically Signed   By: Andrea Gasman M.D.   On: 10/07/2023 14:46    DG C-Arm 1-60 Min-No Report Result Date: 10/07/2023 Fluoroscopy was utilized by the requesting physician.  No radiographic interpretation.   DG C-Arm 1-60 Min-No Report Result Date: 10/07/2023 Fluoroscopy was utilized by the requesting physician.  No radiographic interpretation.   DG Knee Left Port Result Date: 10/07/2023 CLINICAL DATA:  78 year old female status post fall with proximal left femur fracture. Preoperative exam. EXAM: PORTABLE LEFT KNEE - 1-2 VIEW COMPARISON:  Left femur radiographs and CT today. FINDINGS: Two views, including and angulated AP and conventional lateral. Visible mid and distal left femur appear intact. Maintained alignment at the left knee. Joint spaces are normal for age. Patella appears intact. No knee joint effusion is evident. Extensive calcified peripheral vascular disease. And medial and posterior left thigh soft tissue swelling likely related to the intramuscular hematoma seen by CT. IMPRESSION: 1. No acute fracture or dislocation identified about the left knee. 2. Thigh soft tissue swelling related to intramuscular hematoma partially visible by CT. Electronically Signed   By: VEAR Hurst M.D.   On: 10/07/2023 04:32   CT Hip Left Wo Contrast Result Date: 10/07/2023 CLINICAL DATA:  78 year old female status post fall with proximal left femur fracture. Preoperative exam. EXAM: CT OF THE LEFT HIP WITHOUT CONTRAST  TECHNIQUE: Multidetector CT imaging of the left hip was performed according to the standard protocol. Multiplanar CT image reconstructions were also generated. RADIATION DOSE REDUCTION: This exam was performed according to the departmental dose-optimization program which includes automated exposure control, adjustment of the mA and/or kV according to patient size and/or use of iterative reconstruction technique. COMPARISON:  Left hip radiographs 0234 hours today. FINDINGS: Iliofemoral calcified atherosclerosis. Vascular patency is not evaluated in the absence of IV  contrast. Coarsely calcified left inguinal lymph nodes appear to be postinflammatory, are nonenlarged, but numerous (series 5, image 73). No pelvic sidewall hematoma identified. No free fluid identified in the visible left hemipelvis. Comminuted intertrochanteric fracture of the proximal left femur with impaction. Posterior displacement of the dominant greater trochanter fragment. Left femoral head remains normally located at the acetabulum. Osteopenic acetabulum and visible left hemipelvis appear to remain intact. Medial left thigh intramuscular hematoma tracking distally from the fracture site. IMPRESSION: 1. Comminuted intertrochanteric fracture of the proximal left femur with impaction and posterior displacement of the dominant greater trochanter fragment. 2. Medial left thigh intramuscular hematoma tracking distally from the fracture site. 3. Osteopenia. Left acetabulum and visible hemipelvis appear to remain intact. 4. Calcified iliofemoral atherosclerosis. And multiple small but densely calcified post granulomatous appearing left inguinal lymph nodes. Electronically Signed   By: VEAR Hurst M.D.   On: 10/07/2023 04:31   DG Chest Port 1 View Result Date: 10/07/2023 CLINICAL DATA:  78 year old female status post fall with proximal left femur fracture. Preoperative exam. EXAM: PORTABLE CHEST 1 VIEW COMPARISON:  Portable chest 09/03/2023 and earlier. FINDINGS: Portable AP supine view at 0349 hours. Calcified aortic atherosclerosis. Stable cardiomegaly and mediastinal contours. Hyperinflated lungs. Centrilobular emphysema demonstrated by CT in May. Bilateral subpleural lung scarring redemonstrated. Chronic scarring at the right lateral costophrenic angle. No pneumothorax, pulmonary edema, pleural effusion or acute lung opacity. No acute osseous abnormality identified in the chest. IMPRESSION: 1.  Emphysema (ICD10-J43.9). No acute cardiopulmonary abnormality. 2. Cardiomegaly.  Aortic Atherosclerosis (ICD10-I70.0).  Electronically Signed   By: VEAR Hurst M.D.   On: 10/07/2023 04:28   CT Head Wo Contrast Result Date: 10/07/2023 CLINICAL DATA:  Head trauma, minor (Age >= 65y); Neck trauma (Age >= 65y). EXAM: CT HEAD WITHOUT CONTRAST CT CERVICAL SPINE WITHOUT CONTRAST TECHNIQUE: Multidetector CT imaging of the head and cervical spine was performed following the standard protocol without intravenous contrast. Multiplanar CT image reconstructions of the cervical spine were also generated. RADIATION DOSE REDUCTION: This exam was performed according to the departmental dose-optimization program which includes automated exposure control, adjustment of the mA and/or kV according to patient size and/or use of iterative reconstruction technique. COMPARISON:  None Available. FINDINGS: CT HEAD FINDINGS Brain: No evidence of acute infarction, hemorrhage, hydrocephalus, extra-axial collection or mass lesion/mass effect. Patchy white matter hypodensities, compatible with chronic microvascular ischemic disease. Vascular: Calcific atherosclerosis. Skull: No acute fracture.  High left posterior scalp contusion. Sinuses/Orbits: Paranasal sinus mucosal thickening. Other: Similar chronic left parotid mass. CT CERVICAL SPINE FINDINGS Alignment: No substantial sagittal subluxation. Skull base and vertebrae: No acute fracture. No primary bone lesion or focal pathologic process. Osteopenia Soft tissues and spinal canal: No prevertebral fluid or swelling. No visible canal hematoma. 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 Potentially severe right foraminal stenosis at these levels. Upper chest: Emphysema.  Aortic atherosclerosis. IMPRESSION: 1. No evidence of acute intracranial abnormality. Left posterior scalp contusion. 2. No evidence of acute fracture or  traumatic malalignment. 3. Potentially severe right foraminal stenosis C4-C5 and C5-C6. 4. Similar chronic left parotid  mass. Electronically Signed   By: Gilmore GORMAN Molt M.D.   On: 10/07/2023 03:28   CT Cervical Spine Wo Contrast Result Date: 10/07/2023 CLINICAL DATA:  Head trauma, minor (Age >= 65y); Neck trauma (Age >= 65y). EXAM: CT HEAD WITHOUT CONTRAST CT CERVICAL SPINE WITHOUT CONTRAST TECHNIQUE: Multidetector CT imaging of the head and cervical spine was performed following the standard protocol without intravenous contrast. Multiplanar CT image reconstructions of the cervical spine were also generated. RADIATION DOSE REDUCTION: This exam was performed according to the departmental dose-optimization program which includes automated exposure control, adjustment of the mA and/or kV according to patient size and/or use of iterative reconstruction technique. COMPARISON:  None Available. FINDINGS: CT HEAD FINDINGS Brain: No evidence of acute infarction, hemorrhage, hydrocephalus, extra-axial collection or mass lesion/mass effect. Patchy white matter hypodensities, compatible with chronic microvascular ischemic disease. Vascular: Calcific atherosclerosis. Skull: No acute fracture.  High left posterior scalp contusion. Sinuses/Orbits: Paranasal sinus mucosal thickening. Other: Similar chronic left parotid mass. CT CERVICAL SPINE FINDINGS Alignment: No substantial sagittal subluxation. Skull base and vertebrae: No acute fracture. No primary bone lesion or focal pathologic process. Osteopenia Soft tissues and spinal canal: No prevertebral fluid or swelling. No visible canal hematoma. 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 Potentially severe right foraminal stenosis at these levels. Upper chest: Emphysema.  Aortic atherosclerosis. IMPRESSION: 1. No evidence of acute intracranial abnormality. Left posterior scalp contusion. 2. No evidence of acute fracture or traumatic malalignment. 3. Potentially severe right foraminal stenosis C4-C5 and C5-C6. 4.  Similar chronic left parotid mass. Electronically Signed   By: Gilmore GORMAN Molt M.D.   On: 10/07/2023 03:28   DG Hip Unilat W or Wo Pelvis 2-3 Views Left Result Date: 10/07/2023 CLINICAL DATA:  Tripped and fell landing on left leg. EXAM: DG HIP (WITH OR WITHOUT PELVIS) 2-3V LEFT COMPARISON:  None Available. FINDINGS: Comminuted mildly displaced intertrochanteric fracture of the left femur. No dislocation of the femoral head. IMPRESSION: 1. Comminuted mildly displaced intertrochanteric fracture of the left femur. Electronically Signed   By: Norman Gatlin M.D.   On: 10/07/2023 03:21      Time coordinating discharge: over 30 minutes  SIGNED:  Tekoa Hamor DO [facility]

## 2023-10-15 NOTE — TOC Progression Note (Signed)
 Transition of Care [facility]) - Progression Note    Patient Details  Name: Lisa Wilkerson MRN: [id number] Date of Birth: 26-Sep-1945  [facility]) CM/SW Contact  Racheal LITTIE Schimke, RN Phone Number: 10/15/2023, 8:34 AM  Clinical Narrative: [facility] Suzen Lapine to discuss discharge arrangements, no answer left voice message.     Expected Discharge Plan: Skilled Nursing Facility Barriers to Discharge: Continued Medical Work up  Expected Discharge Plan and Services     Post Acute Care Choice: Skilled Nursing Facility Living arrangements for the past 2 months: Single Family Home                                       Social Determinants of Health (SDOH) Interventions SDOH Screenings   Food Insecurity: No Food Insecurity (10/07/2023)  Housing: Low Risk  (10/07/2023)  Transportation Needs: No Transportation Needs (10/07/2023)  Utilities: Not At Risk (10/07/2023)  Depression (PHQ2-9): Low Risk  (01/29/2019)  Financial Resource Strain: Low Risk  (05/14/2023)   Received from [facility]  Physical Activity: Inactive (02/06/2018)  Social Connections: Unknown (10/07/2023)  Stress: No Stress Concern Present (02/06/2018)  Tobacco Use: High Risk (10/07/2023)    Readmission Risk Interventions     No data to display

## 2023-10-15 NOTE — Plan of Care (Signed)

## 2023-10-18 ENCOUNTER — Ambulatory Visit

## 2023-10-18 ENCOUNTER — Encounter: Payer: Self-pay | Admitting: Orthopedic Surgery

## 2023-10-18 DIAGNOSIS — N184 Chronic kidney disease, stage 4 (severe): Secondary | ICD-10-CM | POA: Diagnosis not present

## 2023-10-18 DIAGNOSIS — C3490 Malignant neoplasm of unspecified part of unspecified bronchus or lung: Secondary | ICD-10-CM | POA: Diagnosis not present

## 2023-10-18 DIAGNOSIS — Z8781 Personal history of (healed) traumatic fracture: Secondary | ICD-10-CM | POA: Diagnosis not present

## 2023-10-18 DIAGNOSIS — E440 Moderate protein-calorie malnutrition: Secondary | ICD-10-CM | POA: Diagnosis not present

## 2023-10-18 DIAGNOSIS — I2510 Atherosclerotic heart disease of native coronary artery without angina pectoris: Secondary | ICD-10-CM | POA: Diagnosis not present

## 2023-10-18 DIAGNOSIS — J449 Chronic obstructive pulmonary disease, unspecified: Secondary | ICD-10-CM | POA: Diagnosis not present

## 2023-10-20 ENCOUNTER — Ambulatory Visit

## 2023-10-25 ENCOUNTER — Ambulatory Visit

## 2023-10-25 DIAGNOSIS — N184 Chronic kidney disease, stage 4 (severe): Secondary | ICD-10-CM | POA: Diagnosis not present

## 2023-10-25 DIAGNOSIS — I259 Chronic ischemic heart disease, unspecified: Secondary | ICD-10-CM | POA: Diagnosis not present

## 2023-10-26 DIAGNOSIS — M25552 Pain in left hip: Secondary | ICD-10-CM | POA: Diagnosis not present

## 2023-10-26 DIAGNOSIS — I10 Essential (primary) hypertension: Secondary | ICD-10-CM | POA: Diagnosis not present

## 2023-10-26 DIAGNOSIS — E782 Mixed hyperlipidemia: Secondary | ICD-10-CM | POA: Diagnosis not present

## 2023-10-26 NOTE — Radiation Completion Notes (Signed)
 Patient Name: Lisa Wilkerson, Lisa Wilkerson MRN: [id number] Date of Birth: October 27, 1945 Referring Physician: ALDA CARPEN, M.D. Date of Service: 2023-10-26 Radiation Oncologist: [name], M.D. [facility]                             RADIATION ONCOLOGY END OF TREATMENT NOTE     Diagnosis: R91.1 Solitary pulmonary nodule Intent: Curative     HPI: Patient is a 78 year old female I have been following since November 2024 when she presented with a left upper lobe nodule slightly progressing over time.  Was also slightly hypermetabolic on PET scan per concerning for primary non-small cell lung cancer..  Recent CT scan showed a 1.2 x 2.1 cm mixed density nodule left upper lobe with 9 mm of solid component suspicious for invasive adenocarcinoma.  She underwent bronchoscopy by Dr.Al Donna with pathology showing atypical adenomatous hyperplasia versus lipidic pattern of adenocarcinoma.  Further cytology is pending.  At the time of bronchoscopy navigational markers were placed for consideration of SBRT.  She continues to be asymptomatic although she is slightly anorexic and has poor p.o. intake.  She specifically [place] cough hemoptysis or chest tightness.      ==========DELIVERED PLANS==========  First Treatment Date: 2023-09-15 Last Treatment Date: 2023-10-05   Plan Name: Lung_L_SBRT Site: Lung, Left Technique: SBRT/SRT-IMRT Mode: Photon Dose Per Fraction: 12 Gy Prescribed Dose (Delivered / Prescribed): 24 Gy / 60 Gy Prescribed Fxs (Delivered / Prescribed): 2 / 5     ==========ON TREATMENT VISIT DATES========== 2023-09-30, 2023-10-05     ==========UPCOMING VISITS========== 11/22/2023 [facility] RAD ONCOLOGY FOLLOW UP 30 Chrystal, Glenn, MD        ==========APPENDIX - ON TREATMENT VISIT NOTES==========   See weekly On Treatment Notes in Epic for details in the Media tab (listed as Progress notes on the On Treatment Visit Dates listed above).

## 2023-10-27 ENCOUNTER — Telehealth: Payer: Self-pay | Admitting: *Deleted

## 2023-10-27 ENCOUNTER — Ambulatory Visit

## 2023-10-27 NOTE — Telephone Encounter (Signed)
 This patient is 1 month out at August and that is when you are going to be seeing her but at [facility] work would solve the numbers that hemoglobin is 7.7 and the hematocrit is 24.3 and wanted to know if this patient needs to get a blood transfusion.  The only thing that she sees here is with Dr. Chrystal

## 2023-10-28 NOTE — Telephone Encounter (Signed)
 Patient information and CBC results sent to Dr. Chrystal for his determination for treatment. Family aware and advised to see PCP for follow up on low H&H.

## 2023-10-29 DIAGNOSIS — D649 Anemia, unspecified: Secondary | ICD-10-CM | POA: Diagnosis not present

## 2023-10-29 DIAGNOSIS — N184 Chronic kidney disease, stage 4 (severe): Secondary | ICD-10-CM | POA: Diagnosis not present

## 2023-11-01 ENCOUNTER — Ambulatory Visit

## 2023-11-01 DIAGNOSIS — N189 Chronic kidney disease, unspecified: Secondary | ICD-10-CM | POA: Diagnosis not present

## 2023-11-02 ENCOUNTER — Ambulatory Visit

## 2023-11-05 DIAGNOSIS — J441 Chronic obstructive pulmonary disease with (acute) exacerbation: Secondary | ICD-10-CM | POA: Diagnosis not present

## 2023-11-05 DIAGNOSIS — D649 Anemia, unspecified: Secondary | ICD-10-CM | POA: Diagnosis not present

## 2023-11-11 DIAGNOSIS — Z8781 Personal history of (healed) traumatic fracture: Secondary | ICD-10-CM | POA: Diagnosis not present

## 2023-11-11 DIAGNOSIS — N184 Chronic kidney disease, stage 4 (severe): Secondary | ICD-10-CM | POA: Diagnosis not present

## 2023-11-11 NOTE — Progress Notes (Signed)
 Lisa Wilkerson is a 78 y.o. female seen for follow up visit at [facility]  CHIEF COMPLAINT:  Follow up medical problems    HISTORY OF PRESENT ILLNESS:  Lisa Wilkerson has been     History of fracture of left hip Asked to check hip area with ? Soft mass there. NT, on right at times. Ckd noted and hx of edema. Wt is up and eating better, still with low bp when up. No cp or sob and working with pt  No fever chills sweats are noted, no nausea vomiting or diarrhea have been noted, no chest pain or sob either   Past Medical History:  Diagnosis Date  . CAD (coronary artery disease)   . Carotid artery stenosis    Left ICA, US  at [facility] with 60-99% occlusion  . CKD (chronic kidney disease)   . COPD (chronic obstructive pulmonary disease) (CMS/HHS-HCC)   . History of stroke   . Hyperlipidemia   . Hypertension   . NSTEMI (non-ST elevated myocardial infarction) (CMS/HHS-HCC)   . OSA (obstructive sleep apnea)   . Pneumonia   . Psoriasis   . TIA (transient ischemic attack)     Past Surgical History:  Procedure Laterality Date  . Surgery for sleep apnea  04/1998  . Left carotid artery surgery  11/28/2015   Done by Dr. Marea  . Bilateral tube placement in ears  08/19/2016   Done by Dr. Milissa  . CATARACT EXTRACTION Bilateral       Social History   Socioeconomic History  . Marital status: Widowed  Tobacco Use  . Smoking status: Every Day    Current packs/day: 0.25    Average packs/day: 1 pack/day for 60.6 years (59.4 ttl pk-yrs)    Types: Cigarettes    Start date: 1965    Passive exposure: Current  . Smokeless tobacco: Never  . Tobacco comments:    Patient confirms smoking 1/4ppd. 06/15/2023 -[facility]  Vaping Use  . Vaping status: Never Used  Substance and Sexual Activity  . Alcohol use: No    Alcohol/week: 0.0 standard drinks of alcohol  . Drug use: No  . Sexual activity: Defer  Social History Narrative   Marital Status- Widowed   Lives by herself   Employment- Retired    Exercise hx- Stays active with housework   Religious Affiliation- Christian   Social Drivers of Health   Financial Resource Strain: Low Risk  (05/14/2023)   Overall Financial Resource Strain (CARDIA)   . Difficulty of Paying Living Expenses: Not hard at all  Food Insecurity: No Food Insecurity (10/07/2023)   Received from [facility]   Hunger Vital Sign   . Within the past 12 months, you worried that your food would run out before you got the money to buy more.: Never true   . Within the past 12 months, the food you bought just didn't last and you didn't have money to get more.: Never true  Transportation Needs: No Transportation Needs (10/07/2023)   Received from [facility] - Transportation   . In the past 12 months, has lack of transportation kept you from medical appointments or from getting medications?: No   . In the past 12 months, has lack of transportation kept you from meetings, work, or from getting things needed for daily living?: No  Physical Activity: Inactive (02/06/2018)   Received from [facility]   Exercise Vital Sign   . Days of Exercise per Week: 0 days   . Minutes  of Exercise per Session: 0 min  Stress: No Stress Concern Present (02/06/2018)   Received from [facility] - Occupational Stress Questionnaire   . Feeling of Stress : Not at all  Social Connections: Unknown (10/07/2023)   Received from [facility]   Social Connection and Isolation Panel   . In a typical week, how many times do you talk on the phone with family, friends, or neighbors?: More than three times a week   . How often do you get together with friends or relatives?: More than three times a week   . How often do you attend church or religious services?: Patient declined   . Do you belong to any clubs or organizations such as church groups, unions, fraternal or athletic groups, or school groups?: Patient declined   . How often do you attend meetings of  the clubs or organizations you belong to?: Patient declined   . Are you married, widowed, divorced, separated, never married, or living with a partner?: Widowed  Housing Stability: Low Risk  (06/15/2023)   Housing Stability Vital Sign   . Unable to Pay for Housing in the Last Year: No   . Number of Times Moved in the Last Year: 0   . Homeless in the Last Year: No     Medication reviewed and signed on the nursing facility chart as appropriate   No acute distress No icterus  No JVD Lungs; clear to ascultation Heart; Regular rate and rhythm  Abdomen; Soft and flat, normal bowel sounds Extremities; No clubbing, cyanosis, pitting edema on both lateral hips and low back, trace or so in feet now   Available labs reviewed in the nursing home chart.   ASSESSMENT  AND PLAN: Diagnoses and all orders for this visit:  CKD (chronic kidney disease) stage 4, Cr 1.8 and GFR 29 - 04/12/23 - followed by Dr. Dennise  History of fracture of left hip Assessment & Plan: Asked to check hip area with ? Soft mass there. NT, on right at times. Ckd noted and hx of edema. Wt is up and eating better, still with low bp when up. No cp or sob and working with pt     No room for diuretics with orthostatic changes, keep working on protien increase and pt etc.

## 2023-11-12 ENCOUNTER — Telehealth: Payer: Self-pay

## 2023-11-12 ENCOUNTER — Telehealth: Payer: Self-pay | Admitting: Cardiovascular Disease

## 2023-11-12 NOTE — Telephone Encounter (Signed)
 Pt c/o BP issue: STAT if pt c/o blurred vision, one-sided weakness or slurred speech.  STAT if BP is GREATER than 180/120 TODAY.  STAT if BP is LESS than 90/60 and SYMPTOMATIC TODAY  1. What is your BP concern? Pt states while in rehab her bp drops when they stand her up. They took her off bp meds but her bp still drops when she stands up   2. Have you taken any BP medication today? No   3. What are your last 5 BP readings? does not have any readings but rehab center has them   4. Are you having any other symptoms (ex. Dizziness, headache, blurred vision, passed out)? Dizzy when her bp drops

## 2023-11-12 NOTE — Telephone Encounter (Signed)
 Facility would like  a c/b regarding orders that were sent over for pt. Please advise

## 2023-11-12 NOTE — Telephone Encounter (Signed)
 Is she still taking anything for her blood pressure or has it all been placed on hold? When was the last time she had labs done since she is post -op? Can we go ahead and request her vitals from the rehab center prior to her follow up appointment?

## 2023-11-12 NOTE — Telephone Encounter (Signed)
 Called, spoke with patient- she states she will notify the staff at her facility, but I called over to [facility] and received fax number, will fax request for recent BP readings, as well as the medication adjustment and changes per NP.   Advised with patient to call or have facility call with any issues or concerns.    Fax [phone] to facility.

## 2023-11-12 NOTE — Telephone Encounter (Signed)
 Noted, they are aware.   Thank you!  Will place orders as recommended.

## 2023-11-12 NOTE — Telephone Encounter (Signed)
 Facility called back to clarify-   Patient was currently not taking the Lisinopril  or the Amlodipine , she was taking the Imdur  still (which they will start holding).   They wanted to make sure it would still be okay to take the 2.5 mg Lisinopril  if BP systolic became above 120 per order that was faxed over.  Advised I would call back if any other changes or recommendations were advised.

## 2023-11-12 NOTE — Telephone Encounter (Signed)
 Thank you. Lets see how holding the Imdur  does and we have a plan for them to restart the low dose lisinopril  per parameters. Will follow up on her return to clinic next week. Please fax an order if they need one.

## 2023-11-12 NOTE — Telephone Encounter (Signed)
 If her blood pressure is running in the 70's systolic, lets recommend that she not take either of those medications for the next few days and continue to monitor her blood pressure. If her blood pressure increases with a systolic blood pressure of 120 mmHg or higher tell her to take 2.5 mg of Lisinopril  or half of a pill and to continue to hold the Imdur  as it can cause dizziness and a drop in blood pressure.

## 2023-11-12 NOTE — Telephone Encounter (Signed)
 The only two things she said she continued with was the Lisinopril  and the Imdur  for her cardiac medications. She was unclear if these needed to be held as well, but she has continued with them.   Care everywhere has labs post op from 07/22, these were the only things on file.   I will be glad to request the BP log before the upcoming appointment- thank you!

## 2023-11-12 NOTE — Telephone Encounter (Signed)
 Encounter not needed

## 2023-11-12 NOTE — Telephone Encounter (Signed)
 Contacted patient, she states she has been having some lower blood pressure readings. This morning it was 70's/40's. She does not have a full list with her at this time, but does have some dizziness when she goes from sitting to standing- she fell recently at home and broke her hip, which is the reason behind the rehab. She states she is not longer taking the Amlodipine , but continues with the Lisinopril  and Imdur . Last OV was December 2024- MD recommended 1 year follow up, however with recent fall and issues with blood pressure concerns, scheduled for OV next week with NP.   Advised patient to move positions slowly, stay hydrated, and monitor BP/HR log at home and bring with her to her upcoming visit.  Patient verbalized understanding of this.   Will route to MD/NP/RN team of upcoming appointment. Advised I would call back with any further recommendations.

## 2023-11-12 NOTE — Telephone Encounter (Signed)
 Re-faxed over information to the number below.

## 2023-11-16 DIAGNOSIS — Z515 Encounter for palliative care: Secondary | ICD-10-CM | POA: Diagnosis not present

## 2023-11-18 ENCOUNTER — Other Ambulatory Visit (HOSPITAL_COMMUNITY): Payer: Self-pay

## 2023-11-18 ENCOUNTER — Ambulatory Visit: Attending: Cardiology | Admitting: Cardiology

## 2023-11-18 ENCOUNTER — Other Ambulatory Visit: Payer: Self-pay

## 2023-11-18 ENCOUNTER — Encounter: Payer: Self-pay | Admitting: Cardiology

## 2023-11-18 VITALS — BP 124/68 | HR 62 | Resp 17 | Ht 62.0 in | Wt 108.0 lb

## 2023-11-18 DIAGNOSIS — N1832 Chronic kidney disease, stage 3b: Secondary | ICD-10-CM | POA: Diagnosis not present

## 2023-11-18 DIAGNOSIS — E782 Mixed hyperlipidemia: Secondary | ICD-10-CM | POA: Diagnosis not present

## 2023-11-18 DIAGNOSIS — I7143 Infrarenal abdominal aortic aneurysm, without rupture: Secondary | ICD-10-CM | POA: Diagnosis not present

## 2023-11-18 DIAGNOSIS — I10 Essential (primary) hypertension: Secondary | ICD-10-CM | POA: Diagnosis not present

## 2023-11-18 DIAGNOSIS — I2510 Atherosclerotic heart disease of native coronary artery without angina pectoris: Secondary | ICD-10-CM | POA: Diagnosis not present

## 2023-11-18 DIAGNOSIS — F17200 Nicotine dependence, unspecified, uncomplicated: Secondary | ICD-10-CM

## 2023-11-18 DIAGNOSIS — J449 Chronic obstructive pulmonary disease, unspecified: Secondary | ICD-10-CM

## 2023-11-18 DIAGNOSIS — I959 Hypotension, unspecified: Secondary | ICD-10-CM | POA: Diagnosis not present

## 2023-11-18 DIAGNOSIS — I6523 Occlusion and stenosis of bilateral carotid arteries: Secondary | ICD-10-CM

## 2023-11-18 MED ORDER — MIDODRINE HCL 5 MG PO TABS
5.0000 mg | ORAL_TABLET | Freq: Three times a day (TID) | ORAL | 3 refills | Status: DC
  Filled 2023-11-18 (×2): qty 270, 90d supply, fill #0

## 2023-11-18 MED ORDER — LISINOPRIL 2.5 MG PO TABS
2.5000 mg | ORAL_TABLET | Freq: Every day | ORAL | 3 refills | Status: DC | PRN
  Filled 2023-11-18 (×2): qty 45, 45d supply, fill #0

## 2023-11-18 NOTE — Progress Notes (Signed)
 Cardiology Office Note   Date:  11/18/2023  ID:  Lisa Wilkerson, DOB 23-Jan-1946, MRN [id number] PCP: Alla Amis, MD  [facility] Providers Cardiologist:  Evalene Lunger, MD     History of Present Illness Lisa Wilkerson is a 78 y.o. 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   Previous echocardiogram completed in 2019 revealing LVEF of 65-70%, and trivial aortic valve regurgitation.  Renal artery duplex completed 04/16/2022 revealed evidence of greater than 60% stenosis of the right renal artery.  Evidence of greater than 60% stenosis in the left renal artery.  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  Recommendation was to follow-up with vascular about findings.  She was last seen in clinic 03/15/2023 by Dr. Gollan.  At that time she had recently been diagnosed with adenopathy in the chest with a nodule in the left upper lobe.  She continued to be followed by pulmonary.  She complained of leg weakness, shortness of breath, no energy and chest tightness/indigestion, mostly after eating.  She continues to be followed by vascular for AAA but she was considered not a candidate for endovascular repair.  She continued to maintain follow-up with [facility] aneurysm measured 4.2 cm in diameter on her CT scan of May 2024.  There were no changes made to her medication regimen and no further testing that was ordered at that time.   She was admitted to [facility] on 10/07/2023 for status post mechanical fall resulting in a closed  comminuted intratrochanteric fracture of the proximal end of the left femur.  She was admitted on 7/20 for the left hip fracture and underwent repair.  Postoperatively she was noted to have hypotension with systolic of 60-89 responsive to IV fluid and therefore placed on vasopressor support and admitted to the ICU.  7/04 she was off of pressors and required PRBC transfusion x 3 units.  7/5 PT and OT eval was planning for SNF rehab.  Overnight still anemic with an additional unit of PRBCs given.  7/6 she was started on antibiotic therapy for UTI.  7/7 - 7/9 renal function improved, hemoglobin stable, TOC working on SNF for rehab placement.  7/10 hemoglobin was down to 7.1 added on iron  studies with another unit of PRBCs transfused for total of 5 units, discharge was held until the following day.  7/11 Venofer  dose was given prior to discharge.  She was considered stable and was discharged to skilled nursing facility.  Her aspirin  was placed on hold during hospitalization given her anemia she was continued on her other medications.  Patient contacted the nurse triage line on 11/12/2023 stating that she been having lower blood pressure readings of 70s over 40s.  She did not have her full of blood pressures but was having dizziness with position changes.  They had discontinued her amlodipine  and continued her on lisinopril  and Imdur .  With blood pressures were in the 70s her Imdur  and lisinopril  were also placed on hold.  We had requested an updated list of blood pressure readings from the facility and her postoperative labs.  They were advised he could restart 2.5 mg  of lisinopril  daily for blood pressure greater than 120 mmHg.  She was scheduled for an upcoming visit.  She returns to clinic today accompanied by her son. She had blood pressures taken this morning at the facility revealing a blood pressure sitting up 170/70 and standing up 76/45.  She is extremely orthostatic and symptomatic with dizziness and  lightheadedness.  Her son believes that this was the cause of her fall that led to her hip fracture.  We had previously asked the facility to send updated blood pressure log was prior to our appointment unfortunately they have not been received.  She received several units of blood during her recent hospitalization and has not had any further blood work done to reevaluate.  She has been taken off of all her antihypertensive medications.  She denies any chest discomfort, chest pain, or DOE.  She is positive for lightheadedness/dizziness, and occasional edema.  ROS: 10 point review of systems has been reviewed and considered negative exception was been listed in the HPI  Studies Reviewed EKG Interpretation Date/Time:  Thursday November 18 2023 11:01:50 EDT Ventricular Rate:  63 PR Interval:  176 QRS Duration:  82 QT Interval:  420 QTC Calculation: 429 R Axis:   40  Text Interpretation: Normal sinus rhythm Possible Anterior infarct , age undetermined When compared with ECG of 07-Oct-2023 02:02, PREVIOUS ECG IS PRESENT Confirmed by Gerard Frederick ([id number]) on 11/18/2023 12:15:21 PM    Renal Artery Duplex 04/16/2022 Summary:  Largest Aortic Diameter: 4.0 cm    Renal:    Right: Abnormal size for the right kidney. Normal right Resisitive         Index. Abnormal cortical thickness of right kidney. Evidence         of a > 60% stenosis of the right renal artery. RRV not seen.  Left:  Evidence of a > 60% stenosis in the left renal artery.  Mesenteric:  Normal Celiac artery findings. 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 The Inferior Mesenteric artery appears stenotic. Areas of limited  visceral study include left parenchymal flow and left kidney size.   2D echo 02/08/2018 Study Conclusions  - Left ventricle: The cavity size was normal. Systolic function was    vigorous. The estimated ejection fraction was in the range of 65%    to 70%.  - Aortic valve: There was trivial  regurgitation.  Risk Assessment/Calculations           Physical Exam VS:  BP 124/68 (BP Location: Left Arm, Patient Position: Sitting, Cuff Size: Normal)   Pulse 62   Resp 17   Ht 5' 2 (1.575 m)   Wt 108 lb (49 kg)   SpO2 96%   BMI 19.75 kg/m        Wt Readings from Last 3 Encounters:  11/18/23 108 lb (49 kg)  10/07/23 95 lb (43.1 kg)  09/09/23 102 lb (46.3 kg)    GEN: Well nourished, well developed in no acute distress NECK: No JVD; No carotid bruits CARDIAC: RRR, no murmurs, rubs, gallops RESPIRATORY:  Clear to auscultation without rales, wheezing or rhonchi  ABDOMEN: Soft, non-tender, non-distended EXTREMITIES:  No edema; No deformity   ASSESSMENT AND PLAN Orthostatic hypotension with history of hypertension with previous antihypertensive medications being placed on hold and recently with supine hypertension.  Patient has severe orthostatic hypotension with changing positions and dropping over 100 mmHg and blood pressure.  She had previously had her antihypertensive medications as far as her  amlodipine , lisinopril , and Imdur  were discontinued.  With orthostatic hypotension she has been started on midodrine  5 mg 3 times daily with recommendations for facility to hold if her systolic blood pressures greater than 120 until follow-up visit some of her supine hypertension.  She has been advised to stay to have the facility check her blood pressure when she is sitting up or standing up versus when she is lying in bed as the continuous drops in her blood pressure is prohibiting her from undergoing physical therapy and is prolonging her stay in SNF.  We have requested blood pressure logs be sent to the office for review.  She has also been given parameters of when to hold on monitoring and went to the facility to restart half dose lisinopril .  She is encouraged to continue to wear compression stockings and maintain adequate hydration and slowly changing positions to prevent exacerbation of  her symptoms.  Coronary artery disease involving native coronary with without angina.  EKG today revealed sinus rhythm with old anterior infarct with a rate of 63 with no acute ischemic changes noted from prior studies.  With a recent anemia during hospitalization requiring blood transfusion her aspirin  and complete carvedilol remains on hold with updating labs being ordered today.  She is continued on atorvastatin  80 mg daily.  Peripheral arterial disease/AAA followed by vascular locally and at [facility].  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  Last ultrasound was completed in May 2024 revealing slightly increased send with the aneurysm being greater then 5 cm by her vascular who recommended she undergo surgery even though she was not a candidate for endovascular repair.  She was referred to vascular at [facility] at that time for concurrent management.  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  Will reach out to orthopedics to determine when they feel she can safely restart her aspirin  and clopidogrel .  Updated CBC be drawn today.  Hyperlipidemia with LDL 54 that has remained at goal.  She is continued on atorvastatin .  Chronic renal insufficiency with a serum creatinine of 2.2 on 10/26/2023.  Close to baseline.  Recommend to maintain adequate hydration and continue to avoid NSAIDs.  COPD with ongoing tobacco use not currently in exacerbation.  Total cessation continues to be recommended. followed by pulmonary.  Mechanical fall with left femur fracture status post surgical repair with postoperative complications of hypotension that required blood transfusion and a short period of time vasopressor support.  She is being sent for updated CBC and BMP today to  reevaluate for her anemia.       Dispo: Patient to return to clinic to see MD/APP in 2 to 3 weeks or sooner if needed for reevaluation of blood pressure after initiation of new medications today  Signed, Seleni Meller, NP

## 2023-11-18 NOTE — Patient Instructions (Signed)
 Medication Instructions:  Your physician recommends the following medication changes.  START TAKING: Midodrine  5 mg three times daily hold if systolic >120 Lisinopril  2.5 mg daily as needed if systolic >150  *If you need a refill on your cardiac medications before your next appointment, please call your pharmacy*  Lab Work: Your provider would like for you to have following labs drawn today cbc, bmp.   If you have labs (blood work) drawn today and your tests are completely normal, you will receive your results only by: MyChart Message (if you have MyChart) OR A paper copy in the mail If you have any lab test that is abnormal or we need to change your treatment, we will call you to review the results.  Testing/Procedures: No test ordered today   Follow-Up: At [facility], you and your health needs are our priority.  As part of our continuing mission to provide you with exceptional heart care, our providers are all part of one team.  This team includes your primary Cardiologist (physician) and Advanced Practice Providers or APPs (Physician Assistants and Nurse Practitioners) who all work together to provide you with the care you need, when you need it.  Your next appointment:   2 - 3 week(s)  Provider:   Evalene Lunger, MD or Tylene Lunch, NP

## 2023-11-19 ENCOUNTER — Other Ambulatory Visit: Payer: Self-pay

## 2023-11-19 ENCOUNTER — Ambulatory Visit: Payer: Self-pay | Admitting: Cardiology

## 2023-11-19 DIAGNOSIS — Z515 Encounter for palliative care: Secondary | ICD-10-CM | POA: Diagnosis not present

## 2023-11-19 LAB — CBC
Hematocrit: 26.8 % — ABNORMAL LOW (ref 34.0–46.6)
Hemoglobin: 8.7 g/dL — ABNORMAL LOW (ref 11.1–15.9)
MCH: 29.8 pg (ref 26.6–33.0)
MCHC: 32.5 g/dL (ref 31.5–35.7)
MCV: 92 fL (ref 79–97)
Platelets: 238 x10E3/uL (ref 150–450)
RBC: 2.92 x10E6/uL — ABNORMAL LOW (ref 3.77–5.28)
RDW: 15.9 % — ABNORMAL HIGH (ref 11.7–15.4)
WBC: 9.2 x10E3/uL (ref 3.4–10.8)

## 2023-11-19 LAB — BASIC METABOLIC PANEL WITH GFR
BUN/Creatinine Ratio: 26 (ref 12–28)
BUN: 45 mg/dL — ABNORMAL HIGH (ref 8–27)
CO2: 20 mmol/L (ref 20–29)
Calcium: 8.3 mg/dL — ABNORMAL LOW (ref 8.7–10.3)
Chloride: 100 mmol/L (ref 96–106)
Creatinine, Ser: 1.73 mg/dL — ABNORMAL HIGH (ref 0.57–1.00)
Glucose: 75 mg/dL (ref 70–99)
Potassium: 5 mmol/L (ref 3.5–5.2)
Sodium: 134 mmol/L (ref 134–144)
eGFR: 30 mL/min/1.73 — ABNORMAL LOW (ref >59)

## 2023-11-19 NOTE — Progress Notes (Signed)
 Kidney function has slightly improved, sodium and potassium are stable, hemoglobin 8.7 down from 8.9 on discharge from the hospital which is still considered stable.  Continue current medication regimen without changes at this time.  Please forward results to SNF for primary provider review at the facility.

## 2023-11-22 ENCOUNTER — Ambulatory Visit: Admitting: Radiation Oncology

## 2023-11-23 ENCOUNTER — Other Ambulatory Visit (HOSPITAL_COMMUNITY): Payer: Self-pay

## 2023-11-23 DIAGNOSIS — J441 Chronic obstructive pulmonary disease with (acute) exacerbation: Secondary | ICD-10-CM | POA: Diagnosis not present

## 2023-11-23 DIAGNOSIS — C3490 Malignant neoplasm of unspecified part of unspecified bronchus or lung: Secondary | ICD-10-CM | POA: Diagnosis not present

## 2023-11-23 DIAGNOSIS — Z9181 History of falling: Secondary | ICD-10-CM | POA: Diagnosis not present

## 2023-11-23 DIAGNOSIS — E440 Moderate protein-calorie malnutrition: Secondary | ICD-10-CM | POA: Diagnosis not present

## 2023-11-23 DIAGNOSIS — Z8673 Personal history of transient ischemic attack (TIA), and cerebral infarction without residual deficits: Secondary | ICD-10-CM | POA: Diagnosis not present

## 2023-11-23 DIAGNOSIS — I252 Old myocardial infarction: Secondary | ICD-10-CM | POA: Diagnosis not present

## 2023-11-23 DIAGNOSIS — I951 Orthostatic hypotension: Secondary | ICD-10-CM | POA: Diagnosis not present

## 2023-11-23 DIAGNOSIS — Z7982 Long term (current) use of aspirin: Secondary | ICD-10-CM | POA: Diagnosis not present

## 2023-11-23 DIAGNOSIS — I2510 Atherosclerotic heart disease of native coronary artery without angina pectoris: Secondary | ICD-10-CM | POA: Diagnosis not present

## 2023-11-23 DIAGNOSIS — E785 Hyperlipidemia, unspecified: Secondary | ICD-10-CM | POA: Diagnosis not present

## 2023-11-23 DIAGNOSIS — G4733 Obstructive sleep apnea (adult) (pediatric): Secondary | ICD-10-CM | POA: Diagnosis not present

## 2023-11-23 DIAGNOSIS — Z7902 Long term (current) use of antithrombotics/antiplatelets: Secondary | ICD-10-CM | POA: Diagnosis not present

## 2023-11-23 DIAGNOSIS — I129 Hypertensive chronic kidney disease with stage 1 through stage 4 chronic kidney disease, or unspecified chronic kidney disease: Secondary | ICD-10-CM | POA: Diagnosis not present

## 2023-11-23 DIAGNOSIS — F17210 Nicotine dependence, cigarettes, uncomplicated: Secondary | ICD-10-CM | POA: Diagnosis not present

## 2023-11-23 DIAGNOSIS — S72002D Fracture of unspecified part of neck of left femur, subsequent encounter for closed fracture with routine healing: Secondary | ICD-10-CM | POA: Diagnosis not present

## 2023-11-23 DIAGNOSIS — N184 Chronic kidney disease, stage 4 (severe): Secondary | ICD-10-CM | POA: Diagnosis not present

## 2023-11-23 DIAGNOSIS — J449 Chronic obstructive pulmonary disease, unspecified: Secondary | ICD-10-CM | POA: Diagnosis not present

## 2023-11-23 DIAGNOSIS — Z7951 Long term (current) use of inhaled steroids: Secondary | ICD-10-CM | POA: Diagnosis not present

## 2023-11-30 ENCOUNTER — Other Ambulatory Visit: Payer: Self-pay | Admitting: Family Medicine

## 2023-11-30 ENCOUNTER — Ambulatory Visit
Admission: RE | Admit: 2023-11-30 | Discharge: 2023-11-30 | Disposition: A | Discharge status: Home / Self Care | Source: Ambulatory Visit | Attending: Family Medicine | Admitting: Family Medicine

## 2023-11-30 ENCOUNTER — Other Ambulatory Visit (HOSPITAL_COMMUNITY): Payer: Self-pay

## 2023-11-30 DIAGNOSIS — R600 Localized edema: Secondary | ICD-10-CM | POA: Insufficient documentation

## 2023-11-30 DIAGNOSIS — G909 Disorder of the autonomic nervous system, unspecified: Secondary | ICD-10-CM | POA: Diagnosis not present

## 2023-11-30 MED ORDER — PANTOPRAZOLE SODIUM 40 MG PO TBEC
40.0000 mg | DELAYED_RELEASE_TABLET | Freq: Every day | ORAL | 1 refills | Status: AC
  Filled 2023-11-30: qty 90, 90d supply, fill #0
  Filled 2024-02-23: qty 90, 90d supply, fill #1

## 2023-12-01 ENCOUNTER — Telehealth: Payer: Self-pay | Admitting: Cardiovascular Disease

## 2023-12-01 DIAGNOSIS — Z9889 Other specified postprocedural states: Secondary | ICD-10-CM | POA: Diagnosis not present

## 2023-12-01 DIAGNOSIS — Z8781 Personal history of (healed) traumatic fracture: Secondary | ICD-10-CM | POA: Diagnosis not present

## 2023-12-01 NOTE — Telephone Encounter (Signed)
 It is reasonable to go ahead and discontinue the Imdur  with continued drops in her blood pressure. It can be restarted later on if needed. As far as the ASA 325 mg twice daily, it was likely started to prevent blood clots, but with her anemia or low blood counts and prior hematoma, ASA 81 mg daily being restarted to determine if her blood counts remain stable without additional incidence of bleeding would be recommended since ASA had been placed on hold at hospital discharge.

## 2023-12-01 NOTE — Telephone Encounter (Signed)
 With verbal permission, spoke with the patient's son, who reported that the patient was recently seen by her PCP, who recommended discontinuing Imdur  due to recurrent drops in the patient's blood pressure (readings listed below). However, he noted that the PCP advised the patient to consult with a cardiologist for further recommendations. He also stated that Dr. Lenon from the rehab facility had increased the patient's ASA to 325 mg BID prior to discharge. However, they feel that taking the medication BID may be too much and would like to receive Sheri's input.   8/19: 8:30 am- 105/72 (took midodrine )  3 pm-135/61  7 pm -116/54   7:15 pm -83/45 (pt felt dizzy took midodrine )  10:15 pm -105/61  8/20: 8 am- 86/44 (took midodrine )  2:05 pm -126/60  6:35 pm -107/50 (took midodrine )  7:30 pm -108/48  8/21: 7:30 am 106/54 (took midodrine )  1:30 pm 144/64  8:30 pm 126/55  8/22:   8:30 am 117/59 (took midodrine )  9:30 am 89/49 (stood and was dizzy)  1:56 pm 86/42 (took midodrine )  5:25 pm 111/53  8:30 pm 118/47 (took midodrine )  8/26 8:50 am 144/73  9:50 am 77/44 ( took midodrine )  10:35 am 98/54  3:30 pm 170/78 (took lisinopril  2.5 mg)  4:45 pm 143/64  8:45 pm 129/58  8/27  8:10 am 163/82   Will forward to NP for review.

## 2023-12-01 NOTE — Telephone Encounter (Signed)
 Pt c/o medication issue:  1. Name of Medication: isosorbide  mononitrate (IMDUR ) 30 MG 24 hr tablet  aspirin  81 MG tablet (Paused)  2. How are you currently taking this medication (dosage and times per day)? As Written  3. Are you having a reaction (difficulty breathing--STAT)? No  4. What is your medication issue? Patient is calling because her PCP told her stop taking the Isosorbide  Mononitrate and the hospital upped her Aspirin  to 325 MG. Patient would like know what Sherri recommends regarding this medications. Please advise.

## 2023-12-02 NOTE — Telephone Encounter (Signed)
 Pt's son made aware of recommendations and verbalized understanding.  Medication list updated

## 2023-12-06 ENCOUNTER — Other Ambulatory Visit: Payer: Self-pay

## 2023-12-07 ENCOUNTER — Other Ambulatory Visit (HOSPITAL_COMMUNITY): Payer: Self-pay

## 2023-12-07 ENCOUNTER — Other Ambulatory Visit: Payer: Self-pay

## 2023-12-07 ENCOUNTER — Ambulatory Visit: Attending: Cardiology | Admitting: Cardiology

## 2023-12-07 ENCOUNTER — Encounter: Payer: Self-pay | Admitting: Cardiology

## 2023-12-07 VITALS — BP 174/70 | HR 73 | Ht 62.0 in | Wt 114.8 lb

## 2023-12-07 DIAGNOSIS — I2510 Atherosclerotic heart disease of native coronary artery without angina pectoris: Secondary | ICD-10-CM | POA: Diagnosis not present

## 2023-12-07 DIAGNOSIS — J449 Chronic obstructive pulmonary disease, unspecified: Secondary | ICD-10-CM

## 2023-12-07 DIAGNOSIS — N1832 Chronic kidney disease, stage 3b: Secondary | ICD-10-CM

## 2023-12-07 DIAGNOSIS — I6523 Occlusion and stenosis of bilateral carotid arteries: Secondary | ICD-10-CM

## 2023-12-07 DIAGNOSIS — I7143 Infrarenal abdominal aortic aneurysm, without rupture: Secondary | ICD-10-CM | POA: Diagnosis not present

## 2023-12-07 DIAGNOSIS — E782 Mixed hyperlipidemia: Secondary | ICD-10-CM

## 2023-12-07 DIAGNOSIS — R600 Localized edema: Secondary | ICD-10-CM | POA: Diagnosis not present

## 2023-12-07 DIAGNOSIS — I959 Hypotension, unspecified: Secondary | ICD-10-CM

## 2023-12-07 MED ORDER — MIDODRINE HCL 5 MG PO TABS
5.0000 mg | ORAL_TABLET | ORAL | 3 refills | Status: AC | PRN
  Filled 2023-12-07: qty 90, 90d supply, fill #0

## 2023-12-07 MED ORDER — LISINOPRIL 2.5 MG PO TABS
2.5000 mg | ORAL_TABLET | Freq: Every day | ORAL | 3 refills | Status: DC
  Filled 2023-12-07: qty 45, 45d supply, fill #0
  Filled ????-??-??: fill #0

## 2023-12-07 NOTE — Progress Notes (Signed)
 Cardiology Office Note   Date:  12/07/2023  ID:  Lisa, Wilkerson 09-01-1945, MRN [id number] PCP: Alla Amis, MD  [facility] Providers Cardiologist:  Evalene Lunger, MD     History of Present Illness Lisa Wilkerson is a 78 y.o. 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   Previous echocardiogram completed in 2019 revealing LVEF of 65-70%, and trivial aortic valve regurgitation.  Renal artery duplex completed 04/16/2022 revealed evidence of greater than 60% stenosis of the right renal artery.  Evidence of greater than 60% stenosis in the left renal artery.  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  Recommendation was to follow-up with vascular about findings.  She was last seen in clinic 03/15/2023 by Dr. Gollan.  At that time she had recently been diagnosed with adenopathy in the chest with a nodule in the left upper lobe.  She continued to be followed by pulmonary.  She complained of leg weakness, shortness of breath, no energy and chest tightness/indigestion, mostly after eating.  She continues to be followed by vascular for AAA but she was considered not a candidate for endovascular repair.  She continued to maintain follow-up with [facility] aneurysm measured 4.2 cm in diameter on her CT scan of May 2024.  There were no changes made to her medication regimen and no further testing that was ordered at that time.  She was admitted to [facility] on 10/07/2023 for status post mechanical fall resulting in a closed comminuted intratrochanteric  fracture of the proximal end of the left femur.  She was admitted on 7/20 for the left hip fracture and underwent repair.  Postoperatively she was noted to have hypotension with systolic of 60-89 responsive to IV fluid and therefore placed on vasopressor support and admitted to the ICU.  7/04 she was off of pressors and required PRBC transfusion x 3 units.  7/5 PT and OT eval was planning for SNF rehab.  Overnight still anemic with an additional unit of PRBCs given.  7/6 she was started on antibiotic therapy for UTI.  7/7 - 7/9 renal function improved, hemoglobin stable, TOC working on SNF for rehab placement.  7/10 hemoglobin was down to 7.1 added on iron  studies with another unit of PRBCs transfused for total of 5 units, discharge was held until the following day.  7/11 Venofer  dose was given prior to discharge.  She was considered stable and was discharged to skilled nursing facility.  Her aspirin  was placed on hold during hospitalization given her anemia she was continued on her other medications.   She called a nurse triage line on 11/12/2019/minimal lower blood pressures reading in 70s over 40s.  She did not have her full blood pressure list was having dizziness with positional changes.  They discontinued her amlodipine  but continued her lisinopril  and Imdur .  Blood pressures in the 70s after lisinopril  was placed on hold.  We requested an updated list from the facility prior to her appointment.    She was last seen in clinic 11/18/2023 accompanied by her son.  Blood pressure in the morning about 170/70 with a standing blood pressure 76/45.  She was  extremely orthostatic with symptomatic dizziness and lightheadedness.  She was started on midodrine  5 mg 3 times daily with recommendations for the facility at home systolic blood pressures greater than 120 until follow-up visit.  They also had parameters to restart her lisinopril  and hold her midodrine  if systolic blood pressures greater than 150.  There were no  other medication changes that were made.  Upon further review of discussing with the facility she had been continued on Imdur  which was eventually was discontinued due to lower blood pressures.  She returns to clinic today accompanied by her son.  States that she has been feeling a lot better.  Blood pressure has been slightly elevated over the past week and she has not required the use of midodrine  at home.  Recently amlodipine , Imdur , and lisinopril  were placed on hold.  She was started on midodrine  for significant hypotension.  She was then started on lisinopril  for elevated pressures in between.  She has been trying to continue to work with therapy.  Recently followed up with orthopedics with concerns of swelling to her bilateral lower extremities worse on the surgical side which is the left leg to the right.  She states that she has not worn compression stockings in approximately 2 weeks.  She does not like to elevate her extremities throughout the day because it does cause discomfort to her surgical site.  Dizziness and lightheadedness that has not improved.  She denies any chest discomfort, chest pain, or DOE.  States that she has been compliant with her current medication regimen without any undue side effects.  Denies any recent hospitalizations or visits to the emergency department.  Clopidogrel  continues to remain on hold at this time as she has not been cleared by orthopedics to restart her clopidogrel  as of yet.  ROS: 10 point review of systems has been reviewed and considered negative exception was been listed in the HPI  Studies Reviewed     Renal Artery Duplex 04/16/2022 Summary:  Largest Aortic Diameter: 4.0 cm    Renal:    Right: Abnormal size for the right kidney. Normal right Resisitive         Index. Abnormal cortical thickness of right kidney. Evidence         of a > 60% stenosis of the right renal artery. RRV not seen.  Left:  Evidence of a > 60% stenosis in the left renal  artery.  Mesenteric:  Normal Celiac artery findings. 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 The Inferior Mesenteric artery appears stenotic. Areas of limited  visceral study include left parenchymal flow and left kidney size.    2D echo 02/08/2018 Study Conclusions  - Left ventricle: The cavity size was normal. Systolic function was    vigorous. The estimated ejection fraction was in the range of 65%    to 70%.  - Aortic valve: There was trivial regurgitation. Risk Assessment/Calculations     Physical Exam VS:  BP (!) 174/70 (BP Location: Left Arm, Patient Position: Sitting, Cuff Size: Small)   Pulse 73   Ht 5' 2 (1.575 m)   Wt 114 lb 12.8 oz (52.1 kg)   SpO2 96%   BMI 21.00 kg/m        Wt Readings from Last 3 Encounters:  12/07/23 114 lb 12.8 oz (52.1 kg)  11/18/23 108 lb (49 kg)  10/07/23 95 lb (43.1 kg)    GEN: Well nourished, well developed in no acute distress NECK: No JVD;  No carotid bruits CARDIAC: RRR, no murmurs, rubs, gallops RESPIRATORY:  Clear to auscultation without rales, wheezing or rhonchi  ABDOMEN: Soft, non-tender, non-distended EXTREMITIES:   1+ edema BLE left greater than right; No deformity   ASSESSMENT AND PLAN Orthostatic hypotension that has resolved and she now has back to primary hypertension with blood pressure running 120s to 170s.  She has been off of midodrine  for approximately 5 days with continued stable blood pressures.  She has been taking as needed lisinopril  more frequently for systolic blood pressures greater than 150.  After discussion today with the patient and her son we are restarting lisinopril  2.5 mg daily with continued monitoring of her blood pressure 1 to 2 hours postmedication administration.  If need be we will continue with up titration of medications or restart previous amlodipine  and Imdur .  Coronary artery disease involving native coronary arteries without angina.  Patient continues to remain chest pain-free.   She is continued on aspirin  81 mg daily, atorvastatin  80 mg daily and clopidogrel  continues to remain on hold as she has not gotten cleared from surgery due to recent anemia of restarting her clopidogrel  at this point will reach out to orthopedics to determine when they are comfortable with restarting her clopidogrel .  Peripheral arterial disease/AAA followed by vascular likely at [facility].  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  Last ultrasound completed in May 2024 revealed slightly increased aneurysm being greater than 5 cm by vascular provider who recommended she undergo surgery even though she was not a candidate for endovascular repair.  She was referred to vascular [facility] at that time for concurrent management.  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  Ongoing management per vascular.  Hyperlipidemia with an LDL 54 that has remained at goal.  She is continued on atorvastatin .  Chronic renal insufficiency with a serum creatinine 1.73 on 11/18/2023 which is slightly improved.  Continue to maintain adequate hydration and avoid NSAIDs.  COPD with ongoing tobacco use not currently in exacerbation.  Total cessation has been recommended and patient has been congratulated on not smoking since July.  Continues to be followed by pulmonary.  Mechanical fall with left femur fracture status post surgical repair with postoperative complications of hypotension that required blood transfusion and vasopressor support.  Peripheral edema to the bilateral lower extremities.  With kidney function and orthostatic hypotension with lightheadedness and dizziness and drop in pressures diuretic therapy has been deferred.  She has been encouraged to participate in  conservative therapy of elevating her extremities, foot calf pumps, and compression stockings.  Recent ultrasound completed by orthopedics revealed the leg was negative for DVT.       Dispo: Patient to return to clinic to see MD/APP in 6 weeks or sooner if needed for further evaluation.  Signed, Debarah Mccumbers, NP

## 2023-12-07 NOTE — Progress Notes (Signed)
 Called [facility] regarding Plavix  being paused

## 2023-12-07 NOTE — Patient Instructions (Signed)
 Medication Instructions:  Your physician recommends the following medication changes.  CHANGE: Midodren to as needed Lisinopril  to 2.5 mg daily  *If you need a refill on your cardiac medications before your next appointment, please call your pharmacy*  Lab Work: No labs ordered today  If you have labs (blood work) drawn today and your tests are completely normal, you will receive your results only by: MyChart Message (if you have MyChart) OR A paper copy in the mail If you have any lab test that is abnormal or we need to change your treatment, we will call you to review the results.  Testing/Procedures: No test ordered today   Follow-Up: At [facility], you and your health needs are our priority.  As part of our continuing mission to provide you with exceptional heart care, our providers are all part of one team.  This team includes your primary Cardiologist (physician) and Advanced Practice Providers or APPs (Physician Assistants and Nurse Practitioners) who all work together to provide you with the care you need, when you need it.  Your next appointment:   6 week(s)  Provider:   You may see Timothy Gollan, MD or one of the following Advanced Practice Providers on your designated Care Team:   Lonni Meager, NP Lesley Maffucci, PA-C Bernardino Bring, PA-C Cadence Galion, PA-C Tylene Lunch, NP Barnie Hila, NP

## 2023-12-21 ENCOUNTER — Telehealth: Payer: Self-pay | Admitting: Cardiovascular Disease

## 2023-12-21 ENCOUNTER — Other Ambulatory Visit: Payer: Self-pay

## 2023-12-21 ENCOUNTER — Other Ambulatory Visit: Payer: Self-pay | Admitting: *Deleted

## 2023-12-21 MED ORDER — LISINOPRIL 5 MG PO TABS
5.0000 mg | ORAL_TABLET | Freq: Every day | ORAL | 3 refills | Status: AC
  Filled 2023-12-21: qty 30, 30d supply, fill #0
  Filled 2024-01-16: qty 30, 30d supply, fill #1
  Filled 2024-03-12: qty 30, 30d supply, fill #2
  Filled 2024-04-12: qty 30, 30d supply, fill #3

## 2023-12-21 NOTE — Telephone Encounter (Signed)
 Pt c/o BP issue: STAT if pt c/o blurred vision, one-sided weakness or slurred speech.  STAT if BP is GREATER than 180/120 TODAY.  STAT if BP is LESS than 90/60 and SYMPTOMATIC TODAY  1. What is your BP concern?   Caller (Angie) is concerned patient's BP readings have been trending high  2. Have you taken any BP medication today?  Yes  3. What are your last 5 BP readings?  Today (9/16) at 12:00 noon - 164/82  9:49 am - 142/72 8:40 pm - 154/75 - yesterday (9/15) 9:00 am - 172/89 - yesterday (9/15) 9/14 - 9:00 pm 139/71  4. Are you having any other symptoms (ex. Dizziness, headache, blurred vision, passed out)?   No  Caller (Angie) reported since 9/7 patient has had several readings in the 170 and her diastolic readings have been good. Caller stated patient's BP increases started around the time patient had her last medication change.  Caller wants a call back on next steps.

## 2023-12-21 NOTE — Telephone Encounter (Signed)
 Returned call to home health nurse, [facility]; however, she states that she had left the pt's home and requested that I call pt directly;  I called pt to discuss her blood pressures which she has reported as being high since her medication changes on 9/7.   Today (9/16) at 12:00 noon - 164/82  9:49 am - 142/72 8:40 pm - 154/75 - yesterday (9/15) 9:00 am - 172/89 - yesterday (9/15) 9/14 - 9:00 pm 139/71 Pt verified that she is no longer taking Midodrine .  Her Lisinopril  was decreased from 5 mg to 2.5 mg at her last visit.  She stopped her Norvasc  5 mg on 11/18/2023 due to change in therapy.  I explained to the pt that I would reach out to [facility], NP to advise as to what the next step would be to control her BP.  Pt thanked me for calling her and will continue her current medication regimen until further instructions.

## 2023-12-21 NOTE — Telephone Encounter (Signed)
 Spoke with pt;  Lisa Lunch, NP stated that if pt was taking her Lisinopril  daily at the current dose, then we could increase it back to 5 mg daily.   Pt verified that she is taking Lisinopril  2.5 mg daily.  I advised pt that she could take 2 tablets of the 2.5 mg daily until finished and then she could pick up her prescription for 5 mg tablets.  Pt states that she still has some 5 mg tablets that were left when her dose was changed.  Pt's medications updated.  Instructed pt to keep a log of her blood pressures 1-2 hours after taking her medications.  Pt verbalized understanding and all questions were answered.

## 2023-12-21 NOTE — Progress Notes (Signed)
 Increased dose of Lisinopril  from 2.5 mg to 5 mg per Tylene Lunch, NP

## 2023-12-22 DIAGNOSIS — Z9889 Other specified postprocedural states: Secondary | ICD-10-CM | POA: Diagnosis not present

## 2023-12-22 DIAGNOSIS — Z8781 Personal history of (healed) traumatic fracture: Secondary | ICD-10-CM | POA: Diagnosis not present

## 2023-12-23 ENCOUNTER — Other Ambulatory Visit: Payer: Self-pay

## 2023-12-23 ENCOUNTER — Other Ambulatory Visit (HOSPITAL_COMMUNITY): Payer: Self-pay

## 2023-12-23 DIAGNOSIS — R601 Generalized edema: Secondary | ICD-10-CM | POA: Diagnosis not present

## 2023-12-23 DIAGNOSIS — J449 Chronic obstructive pulmonary disease, unspecified: Secondary | ICD-10-CM | POA: Diagnosis not present

## 2023-12-23 DIAGNOSIS — C3492 Malignant neoplasm of unspecified part of left bronchus or lung: Secondary | ICD-10-CM | POA: Diagnosis not present

## 2023-12-23 DIAGNOSIS — F17200 Nicotine dependence, unspecified, uncomplicated: Secondary | ICD-10-CM | POA: Diagnosis not present

## 2023-12-23 MED ORDER — SPIRONOLACTONE 25 MG PO TABS
25.0000 mg | ORAL_TABLET | Freq: Every day | ORAL | 0 refills | Status: AC
  Filled 2023-12-23: qty 90, 90d supply, fill #0

## 2023-12-23 MED ORDER — TORSEMIDE 20 MG PO TABS
20.0000 mg | ORAL_TABLET | Freq: Every day | ORAL | 0 refills | Status: AC
  Filled 2023-12-23: qty 90, 90d supply, fill #0

## 2023-12-27 ENCOUNTER — Other Ambulatory Visit

## 2024-01-03 ENCOUNTER — Other Ambulatory Visit: Payer: Self-pay

## 2024-01-03 ENCOUNTER — Other Ambulatory Visit (HOSPITAL_COMMUNITY): Payer: Self-pay

## 2024-01-03 DIAGNOSIS — J441 Chronic obstructive pulmonary disease with (acute) exacerbation: Secondary | ICD-10-CM | POA: Diagnosis not present

## 2024-01-03 DIAGNOSIS — R062 Wheezing: Secondary | ICD-10-CM | POA: Diagnosis not present

## 2024-01-03 MED ORDER — DEXAMETHASONE 4 MG PO TABS
ORAL_TABLET | ORAL | 0 refills | Status: DC
  Filled 2024-01-03: qty 33, 20d supply, fill #0

## 2024-01-04 ENCOUNTER — Other Ambulatory Visit: Payer: Self-pay

## 2024-01-05 ENCOUNTER — Ambulatory Visit: Admitting: Radiation Oncology

## 2024-01-14 ENCOUNTER — Other Ambulatory Visit (HOSPITAL_COMMUNITY): Payer: Self-pay

## 2024-01-16 ENCOUNTER — Other Ambulatory Visit (HOSPITAL_COMMUNITY): Payer: Self-pay

## 2024-01-18 ENCOUNTER — Encounter: Payer: Self-pay | Admitting: Cardiology

## 2024-01-18 ENCOUNTER — Ambulatory Visit: Attending: Cardiology | Admitting: Cardiology

## 2024-01-18 VITALS — BP 98/48 | HR 70 | Ht 62.0 in | Wt 110.2 lb

## 2024-01-18 DIAGNOSIS — J449 Chronic obstructive pulmonary disease, unspecified: Secondary | ICD-10-CM | POA: Diagnosis not present

## 2024-01-18 DIAGNOSIS — N1832 Chronic kidney disease, stage 3b: Secondary | ICD-10-CM | POA: Diagnosis not present

## 2024-01-18 DIAGNOSIS — I7143 Infrarenal abdominal aortic aneurysm, without rupture: Secondary | ICD-10-CM

## 2024-01-18 DIAGNOSIS — I6523 Occlusion and stenosis of bilateral carotid arteries: Secondary | ICD-10-CM

## 2024-01-18 DIAGNOSIS — I10 Essential (primary) hypertension: Secondary | ICD-10-CM

## 2024-01-18 DIAGNOSIS — R600 Localized edema: Secondary | ICD-10-CM | POA: Diagnosis not present

## 2024-01-18 DIAGNOSIS — I2510 Atherosclerotic heart disease of native coronary artery without angina pectoris: Secondary | ICD-10-CM

## 2024-01-18 DIAGNOSIS — F17200 Nicotine dependence, unspecified, uncomplicated: Secondary | ICD-10-CM

## 2024-01-18 DIAGNOSIS — E782 Mixed hyperlipidemia: Secondary | ICD-10-CM

## 2024-01-18 NOTE — Patient Instructions (Signed)
 Medication Instructions:  Your physician recommends that you continue on your current medications as directed. Please refer to the Current Medication list given to you today.  *If you need a refill on your cardiac medications before your next appointment, please call your pharmacy*  Lab Work: No labs ordered today  If you have labs (blood work) drawn today and your tests are completely normal, you will receive your results only by: MyChart Message (if you have MyChart) OR A paper copy in the mail If you have any lab test that is abnormal or we need to change your treatment, we will call you to review the results.  Testing/Procedures: No test ordered today   Follow-Up: At [facility], you and your health needs are our priority.  As part of our continuing mission to provide you with exceptional heart care, our providers are all part of one team.  This team includes your primary Cardiologist (physician) and Advanced Practice Providers or APPs (Physician Assistants and Nurse Practitioners) who all work together to provide you with the care you need, when you need it.  Your next appointment:   3 month(s)  Provider:   You may see Julien Nordmann, MD or one of the following Advanced Practice Providers on your designated Care Team:   Nicolasa Ducking, NP Ames Dura, PA-C Eula Listen, PA-C Cadence [place], PA-C Charlsie Quest, NP Carlos Levering, NP

## 2024-01-18 NOTE — Progress Notes (Signed)
 Cardiology Office Note   Date:  01/18/2024  ID:  Lisa Wilkerson, DOB [birth year], MRN [id number] PCP: Alla Amis, MD  [facility] Providers Cardiologist:  Evalene Lunger, MD Cardiology APP:  Gerard Frederick, NP     History of Present Illness Lisa Wilkerson is a 78 y.o. 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   Previous echocardiogram completed in 2019 revealing LVEF of 65-70%, and trivial aortic valve regurgitation.  Renal artery duplex completed 04/16/2022 revealed evidence of greater than 60% stenosis of the right renal artery.  Evidence of greater than 60% stenosis in the left renal artery.  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  Recommendation was to follow-up with vascular about findings.  She was last seen in clinic 03/15/2023 by Dr. Gollan.  At that time she had recently been diagnosed with adenopathy in the chest with a nodule in the left upper lobe.  She continued to be followed by pulmonary.  She complained of leg weakness, shortness of breath, no energy and chest tightness/indigestion, mostly after eating.  She continues to be followed by vascular for AAA but she was considered not a candidate for endovascular repair.  She continued to maintain follow-up with [facility] aneurysm measured 4.2 cm in diameter on her CT scan of May 2024.  There were no changes made to her medication regimen and no further testing that was ordered at that time.  She was admitted to [facility] on 10/07/2023 for status post mechanical fall resulting in a closed  comminuted intratrochanteric fracture of the proximal end of the left femur.  She was admitted on 7/20 for the left hip fracture and underwent repair.  Postoperatively she was noted to have hypotension with systolic of 60-89 responsive to IV fluid and therefore placed on vasopressor support and admitted to the ICU.  7/04 she was off of pressors and required PRBC transfusion x 3 units.  7/5 PT and OT eval was planning for SNF rehab.  Overnight still anemic with an additional unit of PRBCs given.  7/6 she was started on antibiotic therapy for UTI.  7/7 - 7/9 renal function improved, hemoglobin stable, TOC working on SNF for rehab placement.  7/10 hemoglobin was down to 7.1 added on iron  studies with another unit of PRBCs transfused for total of 5 units, discharge was held until the following day.  7/11 Venofer  dose was given prior to discharge.  She was considered stable and was discharged to skilled nursing facility.  Her aspirin  was placed on hold during hospitalization given her anemia she was continued on her other medications.   She called a nurse triage line on 11/12/2019 minimal lower blood pressures reading in 70s over 40s.  She did not have her full blood pressure list was having dizziness with positional changes.  They discontinued her amlodipine  but continued her lisinopril  and Imdur .  Blood pressures in the 70s after lisinopril  was placed on hold.  We requested an updated list from the facility prior to her appointment.   She was seen in clinic 11/18/2023 accompanied by her son.  Blood pressure in the morning was around 170/70 standing blood pressure was  76/45.  She was extremely orthostatic with symptomatic dizziness and lightheadedness.  She was started on midodrine  5 mg 3 times daily with recommendations by the facility when to hold her medications when to restart her medications.   She was last seen in clinic 12/07/2023 stating that she may feel a lot better.  Blood pressure been slightly elevated  over the past week.  She was no longer taking midodrine .  States that she had not worn her compression stockings in approximately 2 weeks.  Continues to have swelling to her bilateral lower extremities.  She had been off her midodrine  for approximately 5 days.  She was restarted on lisinopril  2.5 mg daily with continued monitoring of her blood pressures.  She returns to clinic today accompanied by her son.  She states that overall she has been feeling a lot better.  She has recently followed up with pulmonary and was placed on steroids and had medication changes made.  Blood pressure has been better controlled and she is no longer taking midodrine .  She has continued to monitor pressures at home.  She continues to have swelling to her bilateral lower extremities but unfortunately does not elevate her extremities.  Her dizziness and lightheadedness has improved.  She denies any chest pain, chest comfort or palpitations.  She has been compliant with her current medication regimen without any undue side effects.  Denies any hospitalizations or visits to the emergency department.  After an extensive delay she has been able to restart her clopidogrel .  ROS: 10 point review of system has been reviewed and considered negative the exception was been listed in the HPI  Studies Reviewed     Renal Artery Duplex 04/16/2022 Summary:  Largest Aortic Diameter: 4.0 cm    Renal:    Right: Abnormal size for the right kidney. Normal right Resisitive         Index. Abnormal cortical thickness of right kidney. Evidence         of a > 60% stenosis of the right renal artery. RRV not seen.  Left:  Evidence of a > 60% stenosis in the left renal artery.  Mesenteric:  Normal Celiac artery findings. 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 The Inferior Mesenteric artery appears stenotic. Areas of limited  visceral study include left parenchymal flow and left kidney size.    2D echo 02/08/2018 Study  Conclusions  - Left ventricle: The cavity size was normal. Systolic function was    vigorous. The estimated ejection fraction was in the range of 65%    to 70%.  - Aortic valve: There was trivial regurgitation. Risk Assessment/Calculations           Physical Exam VS:  BP (!) 98/48 (BP Location: Left Arm, Patient Position: Sitting, Cuff Size: Normal)   Pulse 70   Ht 5' 2 (1.575 m)   Wt 110 lb 3.2 oz (50 kg)   SpO2 94%   BMI 20.16 kg/m        Wt Readings from Last 3 Encounters:  01/18/24 110 lb 3.2 oz (50 kg)  12/07/23 114 lb 12.8 oz (52.1 kg)  11/18/23 108 lb (49 kg)    GEN: Well nourished, well developed in no acute distress NECK: No JVD; No carotid bruits CARDIAC: RRR, no murmurs, rubs, gallops RESPIRATORY:  Clear with diminished bases bilaterally to auscultation without rales, wheezing or rhonchi respirations are unlabored at rest,  ABDOMEN: Soft, non-tender, non-distended EXTREMITIES:  2+ edema to ankles and  tops of the feet bilaterally; No deformity   ASSESSMENT AND PLAN Coronary artery disease involving the native coronary arteries without angina.  She continues to remain chest pain-free.  She is continue aspirin  81 mg daily, atorvastatin  80 mg daily and clopidogrel  75 mg daily which she has just recently started.  No further ischemic testing is needed at this time.  Primary hypertension with a history of orthostatic hypotension that is now resolved and she is currently off of midodrine  and back on antihypertensive medications of lisinopril  5 mg daily and diuretics of spironolactone  25 mg daily and torsemide  20 mg daily.  She has been encouraged to monitor pressure 1 to 2 hours postmedication administration at home as well.  If she continues to have soft blood pressures at home with a blood pressure of 90 or less she has reduced her lisinopril  back to 2.5 mg daily if she and her son are both been made aware of this.  Peripheral arterial disease/AAA followed by vascular at  [facility].  She has had mild bilateral carotid disease less than 39% bilaterally and iliac disease noted on CT scan.  Last carotid duplex completed in May 2024 revealed slightly increased aneurysm being greater than 5 cm by vascular provider who recommended she undergo surgery even though she is not a candidate for endovascular repair.  She was referred to vascular surgery at [facility] at the time for concurrent management.  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  Ongoing management per vascular.  Mixed hyperlipidemia with last LDL 54 which continues to remain at goal.  She is continued on atorvastatin .  Chronic renal insufficiency with a baseline serum creatinine is 1.7.  Continue to maintain adequate hydration and avoid NSAIDs.  If PCP is not rechecked labs by return appointment will update CMP.  COPD with ongoing tobacco use not currently in exacerbation.  Total cessation continues to be recommended.  She continues to be followed by pulmonary.  History of mechanical fall with left femur fracture status postsurgical repair and postoperative complications of hypotension that required blood transfusion and vasopressor support.  Peripheral edema bilateral lower extremities with with conservative therapy continue to be recommended.  Amlodipine  has been stopped as well.  Edema is primarily in the ankles and feet.  Patient has been advised to elevate to assist with mobilization of fluid.       Dispo: Patient to return to clinic to see MD/APP in 3 months or sooner if needed for reevaluation  Signed, Aviel Davalos, NP

## 2024-01-19 ENCOUNTER — Telehealth: Payer: Self-pay | Admitting: Cardiovascular Disease

## 2024-01-19 NOTE — Telephone Encounter (Signed)
 Pt c/o medication issue:  1. Name of Medication:   lisinopril  (ZESTRIL ) 5 MG tablet    2. How are you currently taking this medication (dosage and times per day)? Take 1 tablet (5 mg total) by mouth daily.   3. Are you having a reaction (difficulty breathing--STAT)? No  4. What is your medication issue? Patient was informed to cut this medication in half if her bp was low. Per Angie, patient is unaware of what is considered low bp. Angie is calling to get more clarification. Angie did state the patient took a full tablet this morning because her BP was 150's/80's. Angie stated her BP was 112/56 while sitting. When standing the patient's bp went down to 86/46 (taken after standing for ). Patient sat down and 5 mins later her bp went up to 92/48. Angie is requesting for us  to call her back at [phone]. Please advise.

## 2024-01-19 NOTE — Telephone Encounter (Signed)
 Called daughter and physical therapist. Patient had physical therapy today and had a bout of orthostatic hypotension, which she has dealt with in the past. Patient got dizzy upon standing from a seated position and it recovered after sitting for a few minutes. Nurse verbalized that if patient blood pressure recovers from the orthostatic hypotension to systolic > 90, then taking the full dose of lisinopril  in the morning should be okay and just to hold lisinopril  if systolic is < 90 before taking in the morning. Daughter and physical therapist verbalized understanding.

## 2024-02-01 ENCOUNTER — Other Ambulatory Visit: Payer: Self-pay

## 2024-02-01 DIAGNOSIS — Z2821 Immunization not carried out because of patient refusal: Secondary | ICD-10-CM | POA: Diagnosis not present

## 2024-02-01 DIAGNOSIS — C3412 Malignant neoplasm of upper lobe, left bronchus or lung: Secondary | ICD-10-CM | POA: Diagnosis not present

## 2024-02-01 DIAGNOSIS — J449 Chronic obstructive pulmonary disease, unspecified: Secondary | ICD-10-CM | POA: Diagnosis not present

## 2024-02-01 DIAGNOSIS — T17500A Unspecified foreign body in bronchus causing asphyxiation, initial encounter: Secondary | ICD-10-CM | POA: Diagnosis not present

## 2024-02-01 MED ORDER — DEXAMETHASONE 1 MG PO TABS
1.0000 mg | ORAL_TABLET | Freq: Every day | ORAL | 1 refills | Status: AC
  Filled 2024-02-01 – 2024-02-02 (×2): qty 90, 90d supply, fill #0
  Filled 2024-05-01: qty 90, 90d supply, fill #1

## 2024-02-01 MED ORDER — SULFAMETHOXAZOLE-TRIMETHOPRIM 400-80 MG PO TABS
1.0000 | ORAL_TABLET | ORAL | 1 refills | Status: AC
  Filled 2024-02-01: qty 39, 90d supply, fill #0
  Filled 2024-02-02: qty 39, 91d supply, fill #0
  Filled 2024-05-01: qty 39, 91d supply, fill #1

## 2024-02-02 ENCOUNTER — Other Ambulatory Visit: Payer: Self-pay

## 2024-02-08 ENCOUNTER — Other Ambulatory Visit: Payer: Self-pay

## 2024-02-09 ENCOUNTER — Other Ambulatory Visit: Payer: Self-pay | Admitting: *Deleted

## 2024-02-09 DIAGNOSIS — R911 Solitary pulmonary nodule: Secondary | ICD-10-CM

## 2024-02-10 ENCOUNTER — Ambulatory Visit: Admitting: Radiation Oncology

## 2024-02-14 ENCOUNTER — Telehealth: Payer: Self-pay | Admitting: Emergency Medicine

## 2024-02-14 ENCOUNTER — Telehealth: Payer: Self-pay | Admitting: Cardiovascular Disease

## 2024-02-14 NOTE — Telephone Encounter (Signed)
 Messages routed to Dr. Gollan and Tylene Lunch, NP

## 2024-02-14 NOTE — Telephone Encounter (Signed)
 Called and spoke with the patient.  Patient states that she has been bruising extensively since starting back on Plavix .  Patient had a question of why is she on both Aspirin  81 mg and 75 mg Plavix .  And if it was possible to be taken off from one or the other.  Patient advised that this message would be forwarded to Dr. Gollan and Tylene Lunch, NP for review and recommendations.  Patient agreed to this plan.

## 2024-02-14 NOTE — Telephone Encounter (Signed)
 Pt c/o medication issue:  1. Name of Medication:   clopidogrel  (PLAVIX ) 75 MG tablet    2. How are you currently taking this medication (dosage and times per day)?  Take 1 tablet (75 mg total) by mouth daily.      3. Are you having a reaction (difficulty breathing--STAT)? No  4. What is your medication issue? Pt's home health nurse [facility] is requesting a callback regarding pt having a lot of large dark purple bruising on her body since starting this medication again. Please advise

## 2024-02-15 ENCOUNTER — Other Ambulatory Visit (HOSPITAL_COMMUNITY): Payer: Self-pay

## 2024-02-16 NOTE — Telephone Encounter (Signed)
 Called patient to review recommendations due to increased bruising, patient verbalized understanding and agreement with plan. I also sent the information via MyChart

## 2024-02-16 NOTE — Telephone Encounter (Signed)
 With her extensive history of peripheral arterial disease and coronary artery disease would recommend first decrease in aspirin  81 mg to 3 days/week on Monday, Wednesday, Friday.  If no improvement in bruising after reduced aspirin  dosing can consider discontinuing aspirin  at that time and doing Plavix  75 mg daily monotherapy.

## 2024-02-23 ENCOUNTER — Other Ambulatory Visit (HOSPITAL_COMMUNITY): Payer: Self-pay

## 2024-03-12 ENCOUNTER — Other Ambulatory Visit (HOSPITAL_COMMUNITY): Payer: Self-pay

## 2024-03-13 ENCOUNTER — Other Ambulatory Visit: Payer: Self-pay

## 2024-03-15 ENCOUNTER — Other Ambulatory Visit (HOSPITAL_COMMUNITY): Payer: Self-pay

## 2024-03-15 ENCOUNTER — Other Ambulatory Visit: Payer: Self-pay

## 2024-03-15 MED ORDER — ATORVASTATIN CALCIUM 80 MG PO TABS
80.0000 mg | ORAL_TABLET | Freq: Every day | ORAL | 1 refills | Status: AC
  Filled 2024-03-15: qty 100, 100d supply, fill #0

## 2024-03-21 ENCOUNTER — Ambulatory Visit
Admission: RE | Admit: 2024-03-21 | Discharge: 2024-03-21 | Disposition: A | Source: Ambulatory Visit | Attending: Radiation Oncology | Admitting: Radiation Oncology

## 2024-03-21 ENCOUNTER — Ambulatory Visit: Admitting: Dermatology

## 2024-03-21 DIAGNOSIS — R911 Solitary pulmonary nodule: Secondary | ICD-10-CM | POA: Insufficient documentation

## 2024-03-27 ENCOUNTER — Ambulatory Visit
Admission: RE | Admit: 2024-03-27 | Discharge: 2024-03-27 | Disposition: A | Discharge status: Home / Self Care | Source: Ambulatory Visit | Attending: Radiation Oncology | Admitting: Radiation Oncology

## 2024-03-27 ENCOUNTER — Encounter: Payer: Self-pay | Admitting: Radiation Oncology

## 2024-03-27 ENCOUNTER — Other Ambulatory Visit: Payer: Self-pay | Admitting: *Deleted

## 2024-03-27 VITALS — BP 149/73 | HR 69 | Temp 98.3°F

## 2024-03-27 DIAGNOSIS — R911 Solitary pulmonary nodule: Secondary | ICD-10-CM | POA: Diagnosis present

## 2024-03-27 DIAGNOSIS — R531 Weakness: Secondary | ICD-10-CM | POA: Diagnosis not present

## 2024-03-27 DIAGNOSIS — R0602 Shortness of breath: Secondary | ICD-10-CM | POA: Diagnosis not present

## 2024-03-27 DIAGNOSIS — Z923 Personal history of irradiation: Secondary | ICD-10-CM | POA: Insufficient documentation

## 2024-03-27 NOTE — Progress Notes (Signed)
 Radiation Oncology Follow up Note  Name: Lisa Wilkerson   Date:   03/27/2024 MRN:  [id number] DOB: 1945/11/17    This 78 y.o. female presents to the clinic today for 89-month follow-up status post 2 fractions out of 5 of SBRT to a left midlung field lesion biopsy compatible with well-differentiated lipidic adenocarcinoma.  REFERRING PROVIDER: Alla Amis, MD  HPI: Patient is a 78 year old female now seen at 6 months.  She only completed 2 of 5 fractions of SBRT for a left midlung field well-differentiated lipidic adenocarcinoma fracture hip and has been recovering ever since.  She is seen today in follow-up.  She is extremely short of breath weak lethargic.[name]  She had a recent CT scan which has not been formally read although shows a large right pleural effusion.  Left mid lung field lesion seems smaller on my examination.  COMPLICATIONS OF TREATMENT: none  FOLLOW UP COMPLIANCE: keeps appointments   PHYSICAL EXAM:  BP (!) 149/73   Pulse 69   Temp 98.3 F (36.8 C) (Tympanic)  Frail-appearing elderly female wheelchair-bound in NAD decreased breath sounds in the right lung field.  Cardiac examination essentially unremarkable abdomen is benign.  RADIOLOGY RESULTS: CT scan reviewed compatible with above-stated findings  PLAN: At the present time I have asked the patient to return to [facility] for evaluation.  She may need her pleural fluid tapped.  I am not that concerned about the left midlung field lesion at this point in view of her other findings on CT scan.  I will see her back in 3 months for follow-up.  We are contacting pulmonology's office for appointment time.  Patient and son both comprehend my recommendations well.  I would like to take this opportunity to thank you for allowing me to participate in the care of your patient.[name] [name], MD

## 2024-04-12 ENCOUNTER — Other Ambulatory Visit: Payer: Self-pay

## 2024-04-12 ENCOUNTER — Other Ambulatory Visit (HOSPITAL_COMMUNITY): Payer: Self-pay

## 2024-04-12 MED ORDER — TORSEMIDE 20 MG PO TABS
20.0000 mg | ORAL_TABLET | Freq: Every day | ORAL | 0 refills | Status: DC
  Filled 2024-04-12: qty 90, 90d supply, fill #0

## 2024-04-12 MED ORDER — SPIRONOLACTONE 25 MG PO TABS
25.0000 mg | ORAL_TABLET | Freq: Every day | ORAL | 0 refills | Status: DC
  Filled 2024-04-12: qty 90, 90d supply, fill #0

## 2024-04-15 ENCOUNTER — Other Ambulatory Visit (HOSPITAL_COMMUNITY): Payer: Self-pay

## 2024-04-16 ENCOUNTER — Other Ambulatory Visit: Payer: Self-pay

## 2024-04-18 ENCOUNTER — Other Ambulatory Visit: Payer: Self-pay

## 2024-04-19 ENCOUNTER — Other Ambulatory Visit (HOSPITAL_COMMUNITY): Payer: Self-pay

## 2024-04-19 ENCOUNTER — Encounter: Payer: Self-pay | Admitting: Cardiology

## 2024-04-19 ENCOUNTER — Ambulatory Visit: Attending: Cardiology | Admitting: Cardiology

## 2024-04-19 VITALS — BP 160/68 | HR 60 | Ht 62.0 in | Wt 112.4 lb

## 2024-04-19 DIAGNOSIS — I7143 Infrarenal abdominal aortic aneurysm, without rupture: Secondary | ICD-10-CM | POA: Diagnosis not present

## 2024-04-19 DIAGNOSIS — Z79899 Other long term (current) drug therapy: Secondary | ICD-10-CM | POA: Diagnosis not present

## 2024-04-19 DIAGNOSIS — E782 Mixed hyperlipidemia: Secondary | ICD-10-CM | POA: Diagnosis not present

## 2024-04-19 DIAGNOSIS — I10 Essential (primary) hypertension: Secondary | ICD-10-CM | POA: Diagnosis not present

## 2024-04-19 DIAGNOSIS — J449 Chronic obstructive pulmonary disease, unspecified: Secondary | ICD-10-CM | POA: Diagnosis not present

## 2024-04-19 DIAGNOSIS — I2510 Atherosclerotic heart disease of native coronary artery without angina pectoris: Secondary | ICD-10-CM | POA: Diagnosis not present

## 2024-04-19 DIAGNOSIS — R600 Localized edema: Secondary | ICD-10-CM

## 2024-04-19 DIAGNOSIS — I6523 Occlusion and stenosis of bilateral carotid arteries: Secondary | ICD-10-CM | POA: Diagnosis not present

## 2024-04-19 DIAGNOSIS — C3412 Malignant neoplasm of upper lobe, left bronchus or lung: Secondary | ICD-10-CM

## 2024-04-19 DIAGNOSIS — F17200 Nicotine dependence, unspecified, uncomplicated: Secondary | ICD-10-CM

## 2024-04-19 DIAGNOSIS — N1832 Chronic kidney disease, stage 3b: Secondary | ICD-10-CM | POA: Diagnosis not present

## 2024-04-19 MED ORDER — CLOPIDOGREL BISULFATE 75 MG PO TABS
75.0000 mg | ORAL_TABLET | Freq: Every day | ORAL | 3 refills | Status: AC
  Filled 2024-04-19: qty 90, 90d supply, fill #0

## 2024-04-19 NOTE — Progress Notes (Signed)
 " Cardiology Office Note   Date:  04/19/2024  ID:  Lisa Wilkerson, DOB 12-Apr-1945, MRN [id number] PCP: Alla Amis, MD  [facility] Providers Cardiologist:  Evalene Lunger, MD Cardiology APP:  Gerard Frederick, NP     History of Present Illness Lisa Wilkerson is a 79 y.o. 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   Previous echocardiogram completed in 2019 revealing LVEF of 65-70%, and trivial aortic valve regurgitation.  Renal artery duplex completed 04/16/2022 revealed evidence of greater than 60% stenosis of the right renal artery.  Evidence of greater than 60% stenosis in the left renal artery.  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  Recommendation was to follow-up with vascular about findings.  She was last seen in clinic 03/15/2023 by Dr. Gollan.  At that time she had recently been diagnosed with adenopathy in the chest with a nodule in the left upper lobe.  She continued to be followed by pulmonary.  She complained of leg weakness, shortness of breath, no energy and chest tightness/indigestion, mostly after eating.  She continues to be followed by vascular for AAA but she was considered not a candidate for endovascular repair.  She continued to maintain follow-up with [facility] aneurysm measured 4.2 cm in diameter on her CT scan of May 2024.  There were no changes made to her medication regimen and no further testing that was ordered at that time.  She was admitted to [facility] on 10/07/2023 for status post mechanical fall resulting in a closed  comminuted intratrochanteric fracture of the proximal end of the left femur.  She was admitted on 7/20 for the left hip fracture and underwent repair.  Postoperatively she was noted to have hypotension with systolic of 60-89 responsive to IV fluid and therefore placed on vasopressor support and admitted to the ICU.  7/04 she was off of pressors and required PRBC transfusion x 3 units.  7/5 PT and OT eval was planning for SNF rehab.  Overnight still anemic with an additional unit of PRBCs given.  7/6 she was started on antibiotic therapy for UTI.  7/7 - 7/9 renal function improved, hemoglobin stable, TOC working on SNF for rehab placement.  7/10 hemoglobin was down to 7.1 added on iron  studies with another unit of PRBCs transfused for total of 5 units, discharge was held until the following day.  7/11 Venofer  dose was given prior to discharge.  She was considered stable and was discharged to skilled nursing facility.  Her aspirin  was placed on hold during hospitalization given her anemia she was continued on her other medications.   She called a nurse triage line on 11/12/2019 minimal lower blood pressures reading in 70s over 40s.  She did not have her full blood pressure list was having dizziness with positional changes.  They discontinued her amlodipine  but continued her lisinopril  and Imdur .  Blood pressures in the 70s after lisinopril  was placed on hold.  We requested an updated list from the facility prior to her appointment.   She was seen in clinic 11/18/2023 accompanied by her son.  Blood pressure in the morning was around 170/70 standing blood  pressure was 76/45.  She was extremely orthostatic with symptomatic dizziness and lightheadedness.  She was started on midodrine  5 mg 3 times daily with recommendations by the facility when to hold her medications when to restart her medications.  She was last seen in clinic 01/18/2024 accompanied by her son.  Overall she been doing well from a cardiac perspective.   Blood pressure had been better controlled and she was no longer taking monitoring.  Her dizziness and lightheadedness that improved as well.  She was sent for updated labs at the time.  Lisinopril  was reduced back to 2.5 mg daily.  There was no further testing that was ordered.   She returns to clinic today she returns to clinic today accompanied by her son.  She said overall from a cardiac perspective she has been doing well.  States that the last time that she saw a pulmonologist she was placed on high-dose steroids fluid pills and antibiotics.  She continues to have shortness of breath and thick phlegm related to her COPD.  States that she has not had arrival of her medications today as her blood pressure slightly up.  Continues to suffer from a loss of taste and smell.  Continues to have fluid to her bilateral lower extremities.  Denies any recent hospitalizations or visits to the emergency department.  ROS: 10 point review of systems has been reviewed and considered negative the exception was listed in the HPI  Studies Reviewed EKG Interpretation Date/Time:  Wednesday April 19 2024 15:43:49 EST Ventricular Rate:  60 PR Interval:    QRS Duration:  78 QT Interval:  428 QTC Calculation: 428 R Axis:   10  Text Interpretation: Sinus rhythm LOW VOLTAGE When compared with ECG of 18-Nov-2023 11:01, Confirmed by Gerard Frederick ([id number]) on 04/19/2024 3:53:22 PM    Renal Artery Duplex 04/16/2022 Summary:  Largest Aortic Diameter: 4.0 cm    Renal:    Right: Abnormal size for the right kidney. Normal right Resisitive         Index. Abnormal cortical thickness of right kidney. Evidence         of a > 60% stenosis of the right renal artery. RRV not seen.  Left:  Evidence of a > 60% stenosis in the left renal artery.  Mesenteric:  Normal Celiac artery findings. 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 The Inferior Mesenteric artery appears stenotic. Areas of limited  visceral study  include left parenchymal flow and left kidney size.    2D echo 02/08/2018 Study Conclusions  - Left ventricle: The cavity size was normal. Systolic function was    vigorous. The estimated ejection fraction was in the range of 65%    to 70%.  - Aortic valve: There was trivial regurgitation.  Risk Assessment/Calculations     Physical Exam VS:  BP (!) 160/68 (BP Location: Left Arm, Patient Position: Sitting, Cuff Size: Small)   Pulse 60 Comment: 62 oximeter  Ht 5' 2 (1.575 m)   Wt 112 lb 6.4 oz (51 kg)   SpO2 94%   BMI 20.56 kg/m        Wt Readings from Last 3 Encounters:  04/19/24 112 lb 6.4 oz (51 kg)  01/18/24 110 lb 3.2 oz (50 kg)  12/07/23 114 lb 12.8 oz (52.1 kg)    GEN: Well nourished, well developed in no acute distress NECK: No JVD; No carotid bruits CARDIAC: RRR, no murmurs, rubs, gallops RESPIRATORY: Diminished to auscultation without rales, wheezing  or rhonchi  ABDOMEN: Soft, non-tender, non-distended EXTREMITIES:  1+ edema to the bilateral lower extremities with the left being greater than the right according to patient and son and has been the same since her fall.; No deformity   ASSESSMENT AND PLAN Coronary artery disease involving native coronary arteries without angina.  She continues to remain chest pain-free without signs of decompensation.  EKG today reveals sinus rhythm with a rate of 68 with low voltage with no acute ischemic changes noted.  She has been continued on aspirin  but it 3 times week at 81 mg due to excessive bleeding and clopidogrel  75 mg daily.  She is also continued on atorvastatin  80 mg daily.  No further ischemic evaluation is needed at this time.  She has been sent for an updated CBC today.  Primary hypertension with a history of orthostatic hypotension with a blood pressure today 160/60 repeat was higher.  Patient has not required the use of midodrine  in quite some time unfortunately today she is not taking her lisinopril .  She has hide her  diuretics are spironolactone  25 mg daily and torsemide  20 mg daily.  She has been encouraged to continue to monitor her blood pressures 1 to 2 hours postmedication administration as well.  Chart review reveals blood pressures have been lower at other visits.  Peripheral arterial disease/AAA followed by vascular [facility].  She has had mild bilateral carotid disease less than 39% bilateral iliac disease noted on CT scan.  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  Mixed hyperlipidemia with last LDL 54.  She is continued on atorvastatin .  Updated lipid panel ordered today.  Chronic renal insufficiency with a baseline serum creatinine of 1.7.  She has been on diuretic therapy and steroids due to COPD.  She has not had blood work done in quite some time.  She has been encouraged to continue to maintain adequate hydration and avoid NSAIDs.  She has been sent for an updated BMP today.  COPD with prior tobacco use which she has recently stopped with smoking.  She is not currently in a COPD exacerbation today.  Total cessation is recommended she continues to be followed by pulmonary with steroid and antibiotic management.  History of mechanical fall with left femur fracture status postsurgical repair Peripheral edema to the bilateral lower extremities.  With the left leg being more swollen than the right leg.  Unfortunately she has Been dependent for that today.  She has been encouraged to participate in conservative therapy of elevating her extremities foot calf pumps and decrease her sodium intake.  She is also continued on diuretic therapy.  Malignant neoplasm of the upper lobe of the left lung which she is undergoing short burst radiation therapy where a third session was not completed due to her  fall.  She continues to follow with the cancer center.  Ongoing management per pulmonary and oncology.       Dispo: Patient to return to clinic to see MD/APP in 3 months or sooner if needed for further evaluation.  Signed, Jafeth Mustin, NP   "

## 2024-04-19 NOTE — Patient Instructions (Signed)
 Medication Instructions:  Your physician recommends that you continue on your current medications as directed. Please refer to the Current Medication list given to you today.   *If you need a refill on your cardiac medications before your next appointment, please call your pharmacy*  Lab Your provider would like for you to have following labs drawn today CBC, BMP, Lipid.   If you have labs (blood work) drawn today and your tests are completely normal, you will receive your results only by: MyChart Message (if you have MyChart) OR A paper copy in the mail If you have any lab test that is abnormal or we need to change your treatment, we will call you to review the results.  Testing/Procedures: No test ordered today   Follow-Up: At [facility], you and your health needs are our priority.  As part of our continuing mission to provide you with exceptional heart care, our providers are all part of one team.  This team includes your primary Cardiologist (physician) and Advanced Practice Providers or APPs (Physician Assistants and Nurse Practitioners) who all work together to provide you with the care you need, when you need it.  Your next appointment:   3 month(s)  Provider:   You may see Timothy Gollan, MD or one of the following Advanced Practice Providers on your designated Care Team:   Tylene Lunch, NP

## 2024-04-20 ENCOUNTER — Ambulatory Visit: Payer: Self-pay | Admitting: Cardiology

## 2024-04-20 LAB — BASIC METABOLIC PANEL WITH GFR
BUN/Creatinine Ratio: 15 (ref 12–28)
BUN: 27 mg/dL (ref 8–27)
CO2: 23 mmol/L (ref 20–29)
Calcium: 8.7 mg/dL (ref 8.7–10.3)
Chloride: 98 mmol/L (ref 96–106)
Creatinine, Ser: 1.79 mg/dL — ABNORMAL HIGH (ref 0.57–1.00)
Glucose: 83 mg/dL (ref 70–99)
Potassium: 4.2 mmol/L (ref 3.5–5.2)
Sodium: 136 mmol/L (ref 134–144)
eGFR: 29 mL/min/1.73 — ABNORMAL LOW

## 2024-04-20 LAB — LIPID PANEL
Chol/HDL Ratio: 3.6 ratio (ref 0.0–4.4)
Cholesterol, Total: 153 mg/dL (ref 100–199)
HDL: 43 mg/dL
LDL Chol Calc (NIH): 89 mg/dL (ref 0–99)
Triglycerides: 114 mg/dL (ref 0–149)
VLDL Cholesterol Cal: 21 mg/dL (ref 5–40)

## 2024-04-20 LAB — CBC
Hematocrit: 25.9 % — ABNORMAL LOW (ref 34.0–46.6)
Hemoglobin: 8.1 g/dL — ABNORMAL LOW (ref 11.1–15.9)
MCH: 30 pg (ref 26.6–33.0)
MCHC: 31.3 g/dL — ABNORMAL LOW (ref 31.5–35.7)
MCV: 96 fL (ref 79–97)
Platelets: 253 x10E3/uL (ref 150–450)
RBC: 2.7 x10E6/uL — CL (ref 3.77–5.28)
RDW: 17.6 % — ABNORMAL HIGH (ref 11.7–15.4)
WBC: 7.7 x10E3/uL (ref 3.4–10.8)

## 2024-04-20 NOTE — Progress Notes (Signed)
 Kidney function has remained stable.  Potassium and sodium have remained stable.  Hemoglobin is slightly trended down from previous result to 8.1.  RBCs are trending down as well.

## 2024-05-01 ENCOUNTER — Other Ambulatory Visit (HOSPITAL_COMMUNITY): Payer: Self-pay

## 2024-05-01 ENCOUNTER — Other Ambulatory Visit: Payer: Self-pay

## 2024-07-20 ENCOUNTER — Ambulatory Visit: Admitting: Cardiology
